# Patient Record
Sex: Female | Born: 1955
Health system: Southern US, Community
[De-identification: ages and names within clinical notes are randomized; demographics above are authoritative.]

## PROBLEM LIST (undated history)

## (undated) DIAGNOSIS — Z923 Personal history of irradiation: Secondary | ICD-10-CM

## (undated) DIAGNOSIS — M1990 Unspecified osteoarthritis, unspecified site: Secondary | ICD-10-CM

## (undated) DIAGNOSIS — Z9221 Personal history of antineoplastic chemotherapy: Secondary | ICD-10-CM

## (undated) DIAGNOSIS — Z808 Family history of malignant neoplasm of other organs or systems: Secondary | ICD-10-CM

## (undated) DIAGNOSIS — F419 Anxiety disorder, unspecified: Secondary | ICD-10-CM

## (undated) DIAGNOSIS — K589 Irritable bowel syndrome without diarrhea: Secondary | ICD-10-CM

## (undated) DIAGNOSIS — I10 Essential (primary) hypertension: Secondary | ICD-10-CM

## (undated) DIAGNOSIS — E611 Iron deficiency: Secondary | ICD-10-CM

## (undated) DIAGNOSIS — R011 Cardiac murmur, unspecified: Secondary | ICD-10-CM

## (undated) DIAGNOSIS — R112 Nausea with vomiting, unspecified: Secondary | ICD-10-CM

## (undated) DIAGNOSIS — R251 Tremor, unspecified: Secondary | ICD-10-CM

## (undated) DIAGNOSIS — Z9889 Other specified postprocedural states: Secondary | ICD-10-CM

## (undated) DIAGNOSIS — C801 Malignant (primary) neoplasm, unspecified: Secondary | ICD-10-CM

## (undated) DIAGNOSIS — K5790 Diverticulosis of intestine, part unspecified, without perforation or abscess without bleeding: Secondary | ICD-10-CM

## (undated) DIAGNOSIS — Z8051 Family history of malignant neoplasm of kidney: Secondary | ICD-10-CM

## (undated) HISTORY — DX: Diverticulosis of intestine, part unspecified, without perforation or abscess without bleeding: K57.90

## (undated) HISTORY — DX: Essential (primary) hypertension: I10

## (undated) HISTORY — DX: Iron deficiency: E61.1

## (undated) HISTORY — DX: Family history of malignant neoplasm of kidney: Z80.51

## (undated) HISTORY — DX: Irritable bowel syndrome, unspecified: K58.9

## (undated) HISTORY — PX: LAPAROSCOPY: SHX197

## (undated) HISTORY — DX: Family history of malignant neoplasm of other organs or systems: Z80.8

## (undated) HISTORY — PX: LAPAROSCOPIC SIGMOID COLECTOMY: SHX5928

## (undated) HISTORY — PX: BREAST LUMPECTOMY: SHX2

---

## 1998-06-16 ENCOUNTER — Other Ambulatory Visit: Admission: RE | Admit: 1998-06-16 | Discharge: 1998-06-16 | Payer: Self-pay | Admitting: Obstetrics and Gynecology

## 1999-11-28 ENCOUNTER — Other Ambulatory Visit: Admission: RE | Admit: 1999-11-28 | Discharge: 1999-11-28 | Payer: Self-pay | Admitting: Obstetrics and Gynecology

## 2001-01-16 ENCOUNTER — Encounter: Payer: Self-pay | Admitting: Gastroenterology

## 2001-01-16 ENCOUNTER — Ambulatory Visit (HOSPITAL_COMMUNITY): Admission: RE | Admit: 2001-01-16 | Discharge: 2001-01-16 | Payer: Self-pay | Admitting: Gastroenterology

## 2001-03-20 ENCOUNTER — Other Ambulatory Visit: Admission: RE | Admit: 2001-03-20 | Discharge: 2001-03-20 | Payer: Self-pay | Admitting: Obstetrics and Gynecology

## 2002-04-29 ENCOUNTER — Other Ambulatory Visit: Admission: RE | Admit: 2002-04-29 | Discharge: 2002-04-29 | Payer: Self-pay | Admitting: Obstetrics and Gynecology

## 2003-05-12 ENCOUNTER — Other Ambulatory Visit: Admission: RE | Admit: 2003-05-12 | Discharge: 2003-05-12 | Payer: Self-pay | Admitting: Obstetrics and Gynecology

## 2004-07-13 ENCOUNTER — Other Ambulatory Visit: Admission: RE | Admit: 2004-07-13 | Discharge: 2004-07-13 | Payer: Self-pay | Admitting: Obstetrics and Gynecology

## 2005-01-09 ENCOUNTER — Encounter (INDEPENDENT_AMBULATORY_CARE_PROVIDER_SITE_OTHER): Payer: Self-pay | Admitting: *Deleted

## 2005-01-09 ENCOUNTER — Inpatient Hospital Stay (HOSPITAL_COMMUNITY): Admission: RE | Admit: 2005-01-09 | Discharge: 2005-01-13 | Payer: Self-pay | Admitting: Surgery

## 2005-08-17 ENCOUNTER — Ambulatory Visit (HOSPITAL_COMMUNITY): Admission: RE | Admit: 2005-08-17 | Discharge: 2005-08-17 | Payer: Self-pay | Admitting: Chiropractic Medicine

## 2007-10-02 ENCOUNTER — Encounter: Admission: RE | Admit: 2007-10-02 | Discharge: 2007-10-02 | Payer: Self-pay | Admitting: Obstetrics and Gynecology

## 2008-06-23 ENCOUNTER — Encounter: Admission: RE | Admit: 2008-06-23 | Discharge: 2008-06-23 | Payer: Self-pay | Admitting: Obstetrics and Gynecology

## 2008-08-08 ENCOUNTER — Inpatient Hospital Stay (HOSPITAL_COMMUNITY): Admission: RE | Admit: 2008-08-08 | Discharge: 2008-08-10 | Payer: Self-pay | Admitting: Family Medicine

## 2008-08-13 ENCOUNTER — Encounter: Admission: RE | Admit: 2008-08-13 | Discharge: 2008-08-13 | Payer: Self-pay | Admitting: Surgery

## 2009-04-13 ENCOUNTER — Encounter: Admission: RE | Admit: 2009-04-13 | Discharge: 2009-04-13 | Payer: Self-pay | Admitting: Obstetrics and Gynecology

## 2010-05-08 LAB — BASIC METABOLIC PANEL WITH GFR
BUN: 7 mg/dL (ref 6–23)
CO2: 24 meq/L (ref 19–32)
Calcium: 8.4 mg/dL (ref 8.4–10.5)
Chloride: 109 meq/L (ref 96–112)
Creatinine, Ser: 0.62 mg/dL (ref 0.4–1.2)
GFR calc non Af Amer: >60 mL/min
Glucose, Bld: 94 mg/dL (ref 70–99)
Potassium: 3.7 meq/L (ref 3.5–5.1)
Sodium: 138 meq/L (ref 135–145)

## 2010-05-08 LAB — DIFFERENTIAL
Basophils Absolute: 0 K/uL (ref 0.0–0.1)
Basophils Relative: 0 % (ref 0–1)
Eosinophils Absolute: 0.1 K/uL (ref 0.0–0.7)
Eosinophils Relative: 2 % (ref 0–5)
Lymphocytes Relative: 27 % (ref 12–46)
Lymphs Abs: 1.7 K/uL (ref 0.7–4.0)
Monocytes Absolute: 0.5 K/uL (ref 0.1–1.0)
Monocytes Relative: 9 % (ref 3–12)
Neutro Abs: 3.9 K/uL (ref 1.7–7.7)
Neutrophils Relative %: 62 % (ref 43–77)

## 2010-05-08 LAB — CBC
HCT: 37.9 % (ref 36.0–46.0)
Hemoglobin: 13 g/dL (ref 12.0–15.0)
MCHC: 33.6 g/dL (ref 30.0–36.0)
MCHC: 34.3 g/dL (ref 30.0–36.0)
MCV: 87.2 fL (ref 78.0–100.0)
MCV: 88.1 fL (ref 78.0–100.0)
Platelets: 230 10*3/uL (ref 150–400)
RBC: 4.18 MIL/uL (ref 3.87–5.11)
RDW: 12.3 % (ref 11.5–15.5)
RDW: 12.4 % (ref 11.5–15.5)

## 2010-05-08 LAB — BASIC METABOLIC PANEL
BUN: 10 mg/dL (ref 6–23)
CO2: 25 mEq/L (ref 19–32)
Glucose, Bld: 99 mg/dL (ref 70–99)
Potassium: 3.4 mEq/L — ABNORMAL LOW (ref 3.5–5.1)
Sodium: 136 mEq/L (ref 135–145)

## 2010-05-08 LAB — PROTIME-INR: Prothrombin Time: 14.9 seconds (ref 11.6–15.2)

## 2010-05-08 LAB — CREATININE, SERUM: Creatinine, Ser: 0.76 mg/dL (ref 0.4–1.2)

## 2010-05-08 LAB — APTT

## 2010-06-14 NOTE — Consult Note (Signed)
Katie Marquez, Katie Marquez               ACCOUNT NO.:  [id number]   MEDICAL RECORD NO.:  [id number]          PATIENT TYPE:  EMS   LOCATION:  ED                           FACILITY:  [facility]   PHYSICIAN:  Clovis Pu. Cornett, M.D.DATE OF BIRTH:  08-26-1955   DATE OF CONSULTATION:  08/08/2008  DATE OF DISCHARGE:                                 CONSULTATION   PHYSICIAN REQUESTING CONSULTATION:  Dr. Valeria Batman   CHIEF COMPLAINT:  Right lower quadrant pain.   HISTORY OF PRESENT ILLNESS:  The patient is a very pleasant 55 year old  female with a 3-day history of lower abdominal pain.  The pain started  on Wednesday evening, was in both lower quadrants.  Her abdomen was  quite mild and actually improved over Thursday somewhat.  On Friday  evening, though, she began to have more right lower quadrant pain.  She  has had no nausea, vomiting, fever or chills.  The pain is probably a  4/10, described as a dull ache, but the patient was supposed to travel  out of town tomorrow and wanted to get checked out before she left town.  She went to the [facility] where CBC revealed a leukocytosis,  and she was felt to be tender on exam by the physician who saw her  there.  She was sent to the emergency room where Dr. Estell Harpin ordered a CT  scan of her abdomen and pelvis which showed a right lower quadrant  inflammatory process felt to be a right-sided diverticulitis by the  radiologist with a normal-appearing appendix by report.  She had a  phlegmon and a small 1 cm abscess in her right lower quadrant associated  with this.  She had no free air or free fluid otherwise.  Currently, she  feels quite well and has minimal pain at this point.  She has some  tenderness, she states, but it is not too bad.  She is in good  spirits, otherwise is not complaining of any pain elsewhere.  She denies  any history of blood in her stool or any issues with constipation or  diarrhea that are out of the ordinary for her.   She does have irritable  bowel she tells me and takes no medication for this.  She has a history  of sigmoid colectomy done 3 years ago by Dr. Darnell Level for sigmoid  diverticulitis.   PAST MEDICAL HISTORY:  1. Hypertension.  2. Irritable bowel syndrome.  3. History of left-sided diverticulitis.   PAST SURGICAL HISTORY:  Sigmoid colectomy in 2007.   SOCIAL HISTORY:  Denies tobacco or alcohol use.  She is married.  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   MEDICATIONS:  Include lisinopril, Prometrium and estradiol.   ALLERGIES:  NO KNOWN DRUG ALLERGIES.   FAMILY HISTORY:  No history of inflammatory bowel disease.   REVIEW OF SYSTEMS:  Positive for right lower quadrant pain; otherwise,  10-system review of systems is negative.   PHYSICAL EXAMINATION:  VITAL SIGNS:  Temperature 98, pulse 93, blood  pressure 163/86.  GENERAL APPEARANCE:  Pleasant female  in no apparent distress.  HEENT:  The patient wears glasses.  No evidence of scleral icterus.  Oropharynx shows good dentition.  NECK:  Supple, nontender.  Trachea midline.  No cervical adenopathy.  PULMONARY:  Lung sounds are clear and equal bilaterally.  CHEST:  Chest wall motion is normal bilaterally.  CARDIOVASCULAR:  Regular rate and rhythm without rub, murmur or gallop.  EXTREMITIES:  Warm and well perfused.  There is no evidence of any  muscle wasting.  Range of motion is normal.  ABDOMEN:  There is some point tenderness in the right lower quadrant and  some mild fullness.  There is no diffuse peritonitis.  No evidence of  hernia.  Bowel sounds are normal and present.   DIAGNOSTIC STUDIES:  I reviewed her abdominal/pelvic CT scan which shows  an inflammatory process involving the ascending colon.  There is a small  1 cm abscess near the cecum consistent with potential diverticular  abscess, less likely a perforated appendiceal base.  There is no free  fluid or free air.  Her white count is 12,500.  Hemoglobin is  15.   IMPRESSION:  Right lower quadrant inflammatory process, more than likely  right-sided diverticulitis with small abscess without diffuse  peritonitis, less likely perforated appendix with history of sigmoid  diverticular disease.   PLAN:  Recommend IV antibiotics and medical care.  She has relatively a  nonacute abdomen and actually looks quite well considering her CT  findings.  She will be placed on Unasyn, and certainly, she can be  advanced to p.o. antibiotics and discharged home as she feels fit.  She  will need to follow up with a repeat CT scan in 3-5 days, and if she is  doing very well, this can be done as an outpatient.  I would keep her  n.p.o. for the next day or so, see how she does, and if she improves,  she can be discharged early next week with followup with me as an  outpatient.  If her condition worsens, she will require percutaneous  drainage of this fluid collection, but it is quite small now, and I am  not sure they could even do it at this point.  I have discussed this  with the patient and her husband.  They voiced understanding of the  above plan, and she will be admitted to a medical service with surgery  following as needed.   Thank you for this consultation.      Thomas A. Cornett, M.D.  Electronically Signed     TAC/MEDQ  D:  08/08/2008  T:  08/08/2008  Job:  [id number]   cc:   Jethro Bastos, M.D.  Fax: [phone]   Griffith Citron, M.D.  Fax: [phone]

## 2010-06-14 NOTE — Discharge Summary (Signed)
[name], Marquez NO.:  [id number]   MEDICAL RECORD NO.:  [id number]          PATIENT TYPE:  INP   LOCATION:  1531                         FACILITY:  [facility]   PHYSICIAN:  Ramiro Harvest, MD    DATE OF BIRTH:  [birth year]   DATE OF ADMISSION:  08/08/2008  DATE OF DISCHARGE:  08/10/2008                               DISCHARGE SUMMARY   PRIMARY CARE PHYSICIAN:  Jethro Bastos, M.D., [facility].   DISCHARGE DIAGNOSES:  1. Right-sided diverticulitis with a small 1.2 cm abscess.  2. Hypokalemia, resolved.  3. Borderline hypotension, resolved.  4. Hypertension.  5. Irritable bowel syndrome.  6. History of left diverticulitis status post sigmoid colectomy in      2007.   DISCHARGE MEDICATIONS:  1. Augmentin 875 mg p.o. b.i.d. x13 days.  2. Lisinopril 20 mg p.o. q. daily.  3. Prometrium 100 mg q.h.s.  4. Estradiol patch 0.0375 mg per day TD q.4 days.   DISPOSITION/FOLLOW UP:  The patient will be discharged home.  The  patient will need a follow up CT of the abdomen and pelvis, which will  be arranged per Dr. Luisa Hart on January 13, 2009.  The patient will then  follow up with Dr. Luisa Hart on Friday,  January 14, 2009.  The patient  is to follow up with Dr. Kinnie Scales as needed and the patient is to follow  up with Dr. Dorothe Pea as previously scheduled.  The patient is to follow  up with Dr. Kinnie Scales in 2 weeks.  The patient may need a repeat  colonoscopy done to follow up on CT findings.   CONSULTATIONS:  A general surgery consult was done.  The patient was  seen in consultation by Dr. Luisa Hart on August 08, 2008.   PROCEDURES PERFORMED:  A CT of the abdomen and pelvis was performed on  August 08, 2008 which showed concentric low-density wall thickening  involving a 6 cm long segment of the mid to distal stomach.  This has an  appearance most compatible with gastritis.  This is lower in density  than expected for neoplasm.  Followup endoscopy is recommended.  Cholelithiasis.  Inferior right colonic diverticulitis with a 1.2 cm  abscess.  A perforated colonic neoplasm was less likely.  Follow up  colonoscopy is recommended to exclude a neoplastic process, normal  appendix, extensive colonic diverticulosis, and a fibroid uterus.   ADMISSION HISTORY AND PHYSICAL:  Ms. Katie Marquez is a 55 year old  white female, history of diverticulitis status post sigmoid resection of  the colon December, 2006 per Dr. Gerrit Friends.  The patient has done well over  the last 3-1/2 years post surgery.  On Wednesday night,  August 05, 2008,  the patient started experiencing mild discomfort in the right lower  quadrant.  This was not bothering her too much, but the night prior to  admission she experienced discomfort and was surprised because whenever  she had flare ups of her diverticulitis they were always left-sided.  The patient has stated that she was not in pain but when she pressed on  her right lower quadrant, that  was when the pain occurred.  The patient  was concerned about this because she was leaving town on Monday and  wanted to get checked out prior to leaving town. The patient presented  to the walk-in clinic at her primary care's office.  Preliminary CBC  done there revealed a leukocytosis.  CT scan of her abdomen and pelvis  was ordered.  CT scan showed evidence of inferior right colonic  diverticulitis with a 1.2 cm abscess.  There was no evidence of  appendicitis.  An extensive colonic diverticulosis was noted as well.  Therefore, the patient was sent to the emergency room for further  management.  Surgical consult was obtained in the ED and the surgeon  wanted the patient to be admitted to the medical service and would  consult on the patient.  The patient denied any fever or chills and had  not had any loose bowel movement.  The patient had no blood in her  stools.   PHYSICAL EXAM:  Per admitting physician, temperature 98.8, blood  pressure 152/104,  pulse of 93, respirations 18, satting 99% on room air.  GENERAL:  The patient no apparent distress, alert, awake and oriented  x3, afebrile.  HEENT:  Normocephalic, atraumatic.  Pupils equal, round and reactive to  light and accommodation.  Extraocular movements intact.  Mucous  membranes were moist.  NECK:  Supple.  No JVD, no lymphadenopathy.  CARDIOVASCULAR:  Regular rate and rhythm.  No murmurs, rubs or gallops.  RESPIRATORY:  Lungs were clear to auscultation bilaterally.  ABDOMEN:  Soft.  Tenderness in the right lower quadrant.  No rebound, no  guarding.  Normoactive bowel sounds.  EXTREMITIES:  No clubbing, cyanosis or edema.  NEUROLOGICALLY:  Grossly nonfocal.   ADMISSION LABS:  CBC:  White count 12.5, hemoglobin 15, hematocrit 45,  platelets 313, neutrophils of 80%, lymphocytes 16%, monocytes 4%.  Urinalysis was within normal limits.  CT scan as stated above.   HOSPITAL COURSE:  1. Right-sided diverticulitis with a 1.2 cm abscess.  The patient was      admitted with a diverticulitis with a 1.2 cm abscess.  The patient      was initially made n.p.o., placed on empiric IV antibiotics of      Unasyn and supportive care was initiated.  General surgery was      consulted.  The patient was seen by Dr. Luisa Hart on August 08, 2008      and it was felt that conservative measures needed to be followed at      that time.  The patient was monitored.  The patient improved      clinically and was started on a clear liquid diet, which was      subsequently advanced and the patient tolerated diet well.  The      patient improved clinically.  Antibiotics were changed from IV      Unasyn to oral Augmentin, which the patient tolerated well.  The      patient remained afebrile with normalization of her white count to      4.8.  The patient was followed throughout the hospitalization per      general surgery with Dr. Luisa Hart and the patient remained in stable      and improved condition.  It was  felt that patient could be      discharged with a follow up CT of the abdomen and pelvis to be      obtained on August 13, 2008 with a follow up with Dr. Luisa Hart on August 14, 2008.  Patient will be discharged in stable and improved      condition.  2. Hypokalemia.  During the hospitalization, patient was noted to be      hypokalemic.  The patient's potassium was repleted and by day of      discharge, patient's hypokalemia had resolved.  3. Borderline hypotension.  During the hospitalization, patient had      borderline hypotension.  Her blood pressure medications were held.      The patient's blood pressure dropped as low as 96/61.  Patient was      maintained on IV fluids with clinical and symptomatic improvement.      The patient's blood pressure medication was held and on day of      discharge patient's blood pressure was 129/85.  The patient will      resume her antihypertensives on discharge.   The rest of the patient's chronic medical issues remained stable  throughout the hospitalization.  The patient will be discharged in  stable and improved condition.  On day of discharge, vital signs:  Temperature 97.6, pulse of 71, respirations 18, blood pressure 129/85,  satting 99% on room air.   DISCHARGE LABS:  Sodium 138, potassium 3.7, chloride 109, bicarb 24, BUN  7.0, creatinine 0.62, glucose of 94, calcium of 8.4.  CBC with a white  count of 4.8, hemoglobin 12.4, platelets of 221, hematocrit of 36.8.   It was a pleasure taking care of Katie Marquez.      Ramiro Harvest, MD  Electronically Signed     DT/MEDQ  D:  08/10/2008  T:  08/10/2008  Job:  [id number]   cc:   Griffith Citron, M.D.  Fax: [phone]   Clovis Pu. Cornett, M.D.  [address]  [place] [address]   Jethro Bastos, M.D.  Fax: [phone]

## 2010-06-14 NOTE — H&P (Signed)
Katie Marquez               ACCOUNT NO.:  [id number]   MEDICAL RECORD NO.:  [id number]          PATIENT TYPE:  EMS   LOCATION:  ED                           FACILITY:  [facility]   PHYSICIAN:  Lucile Crater, MD         DATE OF BIRTH:  02-13-1955   DATE OF ADMISSION:  08/08/2008  DATE OF DISCHARGE:                              HISTORY & PHYSICAL   PRIMARY CARE PHYSICIAN:  Dr. __________   CHIEF COMPLAINT:  Abdominal pain x3 days.   HISTORY OF PRESENT ILLNESS:  Katie Marquez is a 55 year old white female  with a history of diverticulitis requiring resection of a segment of Katie Marquez  colon in December 2006 by Dr. Gerrit Friends.  The patient has done well over  the last 3-1/2 years after the surgery.  On Wednesday night, August 05, 2008, she started experiencing mild discomfort in the right lower  quadrant.  This is not bothering Katie Marquez too much, but last night she  experienced discomfort, and she was surprised because whenever she had  the flare ups of Katie Marquez diverticulitis, they were always left-sided.  The  patient states that she is not in pain, but when she presses on Katie Marquez  right lower quadrant, that is when the pain occurs.  She was concerned  about this because they are leaving town on Monday, and she wanted to  get it checked out before they leave.  So, she presented to the walk-in  clinic at the primary care physician's office; the preliminary CBC done  there revealed leukocytosis, and a CT scan of Katie Marquez abdomen and pelvis was  ordered.  The CT scan had evidence of inferior right colon  diverticulitis with a 1.2 cm abscess.  There was no evidence of  appendicitis, and extensive colonic diverticulosis was noted as well.  Therefore, the patient was sent to the emergency room for further  management.  A surgical consult was obtained by the ER physician, and  the surgeon wanted [place] to admit the patient, and he would consult.  The  patient denies having any fever or chills.  She does not have any change  in  Katie Marquez bowel movements.  She denies seeing any blood in Katie Marquez stool.   REVIEW OF SYSTEMS:  Complete review of systems was done which included  general, HEENT, cardiovascular, respiratory, GI, GU, endocrine,  musculoskeletal, skin, neurologic and psychiatric, and all are within  normal limits.   PAST MEDICAL HISTORY:  1. Hypertension.  2. Diverticulitis, status post resection of segment of the colon.  3. Irritable bowel syndrome.   ALLERGIES:  None.   CURRENT MEDICATIONS AT HOME:  1. Lisinopril 20 mg once a day.  2. Estradiol patch.  3. Prometrium 1 pill once a day.   SOCIAL HISTORY:  There is no history of tobacco abuse.  She drinks  socially.  There is no history of illicit drug use.  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   FAMILY HISTORY:  Diverticulitis in Katie Marquez father.  There is no history of  colon cancer in the family.   PHYSICAL EXAMINATION:  VITAL SIGNS:  T. max 98.8 degrees F., blood  pressure 152/104, pulse rate 93, respirations 18, O2 saturation 99% on  room air.  GENERAL APPEARANCE:  Not in acute distress.  Alert, awake and oriented  x3.  Afebrile.  HEENT:  Normocephalic, atraumatic.  Pupils are equal and reactive to  light and accommodation.  Extraocular muscles are intact.  Mucous  membranes are moist.  NECK:  Supple.  There is no JVD or lymphadenopathy.  CARDIOVASCULAR:  Regular rhythm.  Rate is normal.  No murmurs, rubs, or  gallops.  LUNGS:  Clear.  ABDOMEN:  Soft, tenderness in the right lower quadrant.  There is no  rebound or guarding.  Bowel sounds are normoactive.  EXTREMITIES:  No clubbing, cyanosis or edema.  NEUROLOGICAL:  Grossly nonfocal.   LABORATORIES:  Obtained at the primary care physician's office:  1. CBC with differential:  WBC 12,500, hemoglobin 15, hematocrit 45,      platelets 313,000.  Neutrophils 80%.  Lymphocytes 16%.  Monocytes      4%.  2. Urinalysis:  Within normal limits.  3. CT scan of the abdomen and pelvis with contrast dated  August 08, 2008:  Inferior right colon diverticulitis with a 1.2 cm abscess.      A perforated colonic neoplasm is less likely.  Follow-up      colonoscopy is recommended to exclude a neoplastic process.  Normal      appendix.  Extensive colonic diverticulosis.  Fibroid uterus.   ASSESSMENT/PLAN:  1. Acute complicated diverticulitis:  The patient has evidence of an      abscess formation on CAT scan.  We will admit the patient for      empiric antibiotic therapy.  We will start the patient on      ampicillin/sulbactam 3 grams IV q.6 h.  Patient might require      drainage of the abscess, and based on the culture results, we will      tailor the antibiotic regimen.  A surgery consult was already      called.  We will follow up on their recommendations.  2. Hypertension:  Not at goal.  Will titrate the medications.  3. Deep venous thrombosis prophylaxis with sequential compression      devices.  4. Fluids, electrolytes and nutrition:  We will start the patient on      normal saline 100 mL an hour.  Will replace electrolytes as needed.      She will be made n.p.o.   DISPOSITION:  We will admit the patient to the floor.      Lucile Crater, MD  Electronically Signed     TA/MEDQ  D:  08/08/2008  T:  08/08/2008  Job:  [id number]

## 2010-06-17 NOTE — Discharge Summary (Signed)
[name], Marquez               ACCOUNT NO.:  [id number]   MEDICAL RECORD NO.:  [id number]          PATIENT TYPE:  INP   LOCATION:  1402                         FACILITY:  [facility]   PHYSICIAN:  Velora Heckler, MD      DATE OF BIRTH:  Jul 05, 1955   DATE OF ADMISSION:  01/09/2005  DATE OF DISCHARGE:  01/13/2005                                 DISCHARGE SUMMARY   REASON FOR ADMISSION:  Sigmoid diverticular disease.   HISTORY OF PRESENT ILLNESS:  Katie Marquez is a pleasant 55 year old white  female from [place], [place] referred by Dr. Sharrell Ku for  sigmoid colectomy for management of sigmoid diverticular disease. The  patient has had multiple episodes of acute diverticulitis over the past 10  years. Her colonoscopy exams have become technically difficult due to the  significance of the disease in the sigmoid colon. The patient now comes to  surgery for sigmoid colectomy.   HOSPITAL COURSE:  The patient was admitted on January 09, 2005. She was  taken directly to the operating room where she underwent sigmoid colectomy  without complications. Her postoperative course was straight forward. She  received 24 hours of intravenous antibiotics. She had resolution of her  ileus and started on a clear liquid diet. She was advanced to a regular diet  on the third postoperative day. The patient had return of GI function with  bowel sounds and flatus. She was prepared for discharge home on the fourth  postoperative day.   PLAN:  The patient is discharged home today January 13, 2005 in good  condition, tolerating a regular diet, and ambulating independently. Final  pathology shows diverticular disease with diverticulosis and diverticulitis.  Eight lymph nodes are benign. Discharge medications include Percocet as  needed for pain. The patient will be seen back in my office at [facility] in 5 days for wound check and staple removal.   FINAL DIAGNOSES:  Sigmoid  diverticular disease.   CONDITION ON DISCHARGE:  Good.      Velora Heckler, MD  Electronically Signed     TMG/MEDQ  D:  01/13/2005  T:  01/16/2005  Job:  [id number]   cc:   Griffith Citron, M.D.  Fax: [phone]   Teena Irani. Arlyce Dice, M.D.  Fax: [phone]   Duke Salvia. Marcelle Overlie, M.D.  Fax: [phone]

## 2010-06-17 NOTE — Op Note (Signed)
Marquez, Katie               ACCOUNT NO.:  [id number]   MEDICAL RECORD NO.:  [id number]          PATIENT TYPE:  INP   LOCATION:  0001                         FACILITY:  [facility]   PHYSICIAN:  Velora Heckler, MD      DATE OF BIRTH:  04/05/1955   DATE OF PROCEDURE:  01/09/2005  DATE OF DISCHARGE:                                 OPERATIVE REPORT   PREOPERATIVE DIAGNOSIS:  Sigmoid diverticular disease.   POSTOPERATIVE DIAGNOSIS:  Sigmoid diverticular disease.   PROCEDURE:  Sigmoid colectomy.   SURGEON:  Velora Heckler, MD.   ASSISTANT:  Avel Peace, MD.   ANESTHESIA:  General per Ronelle Nigh, MD.   ESTIMATED BLOOD LOSS:  50 mL.   PREPARATION:  Betadine.   COMPLICATIONS:  None.   INDICATIONS:  The patient is a 55 year old white female from [place],  [place] who presents at the request Dr. Sharrell Ku for sigmoid  colectomy. The patient has had symptoms of diverticular disease for greater  than 10 years. She has had multiple episodes of acute diverticulitis  documented on CT scan and treated with antibiotics. The patient has  undergone colonoscopy in the past. Her last procedure was terminated in 2002  due to technical difficulty and she required a barium enema. The patient now  comes to surgery for sigmoid colectomy for treatment of longstanding sigmoid  diverticular disease.   DESCRIPTION OF PROCEDURE:  The procedure was done on OR#12 at the [facility]. The patient was brought to the operating room,  placed in a supine position on the operating room table. Following the  administration of general anesthesia, the patient was placed in low  lithotomy position and then prepped and draped in the usual strict aseptic  fashion. After ascertaining that an adequate level of anesthesia had been  obtained, a low midline surgical incision is made with a #10 blade from just  below the umbilicus to just above the pubis in the midline. Dissection  was  carried down through the subcutaneous tissues to the fascia. The fascia was  incised in the midline and the peritoneal cavity was entered cautiously. The  abdomen was briefly explored. No significant palpable masses were  identified. The patient is quite thin. Examination of the sigmoid colon  shows dense diverticular disease in the mid sigmoid. This continues to the  distal sigmoid where the bowel appears grossly normal just above the level  of the peritoneal reflection. The descending colon contained scattered  diverticula. The transverse colon contained scattered diverticula. The right  colon and cecum also contained scattered diverticula. The sigmoid colon was  mobilized from its lateral peritoneal attachments. A point in the distal  descending colon is selected and the mesentery was divided between Dunstan  clamps and ligated with 2-0 silk ties. Dissection was carried distally. The  mesentery was divided sequentially between [facility] clamps and ligated with 2-0  silk ties. Stay sutures were placed proximally and distally. Kocher clamps  are used to occlude the proximal and distal ends of the sigmoid colon and  the bowel is transected with a #  10 blade proximally and distally and the  entire sigmoid colon is removed. The descending colon is mobilized along its  lateral peritoneal attachments. The distal descending colon and distal  sigmoid colon are easily laid in apposition without tension. An end to end  anastomosis is then created with interrupted 3-0 silk sutures. Good  approximation was noted without tension. Good hemostasis was noted. The  abdomen was copiously irrigated with warm saline which was evacuated.  Vitagel is then used circumferentially around the anastomosis with good  sealant effect. Good hemostasis was noted. The sponges are removed and  sponge counts are correct. Omentum is used to cover the small bowel. The  midline wound is closed with a running #1 PDS suture. The  subcutaneous  tissue is irrigated and hemostasis obtained with the electrocautery. The  skin is closed with stainless steel staples. Sterile dressings are applied.  The patient was awakened from anesthesia and brought to the recovery room in  stable condition. The patient tolerated the procedure well.      Velora Heckler, MD  Electronically Signed     TMG/MEDQ  D:  01/09/2005  T:  01/10/2005  Job:  [id number]   cc:   Griffith Citron, M.D.  Fax: [phone]   Teena Irani. Arlyce Dice, M.D.  Fax: [phone]   [facility] Salvia. Marcelle Overlie, M.D.  Fax: [phone]

## 2010-09-27 ENCOUNTER — Other Ambulatory Visit: Payer: Self-pay | Admitting: Obstetrics and Gynecology

## 2010-09-27 DIAGNOSIS — Z1231 Encounter for screening mammogram for malignant neoplasm of breast: Secondary | ICD-10-CM

## 2010-09-28 ENCOUNTER — Ambulatory Visit: Payer: Self-pay

## 2010-10-06 ENCOUNTER — Ambulatory Visit
Admission: RE | Admit: 2010-10-06 | Discharge: 2010-10-06 | Disposition: A | Discharge status: Home / Self Care | Payer: 59 | Source: Ambulatory Visit | Attending: Obstetrics and Gynecology | Admitting: Obstetrics and Gynecology

## 2010-10-06 DIAGNOSIS — Z1231 Encounter for screening mammogram for malignant neoplasm of breast: Secondary | ICD-10-CM

## 2010-10-07 ENCOUNTER — Ambulatory Visit: Payer: Self-pay

## 2011-06-21 ENCOUNTER — Other Ambulatory Visit: Payer: Self-pay | Admitting: Obstetrics and Gynecology

## 2011-06-21 DIAGNOSIS — Z1231 Encounter for screening mammogram for malignant neoplasm of breast: Secondary | ICD-10-CM

## 2011-09-18 ENCOUNTER — Ambulatory Visit: Payer: 59

## 2011-09-18 ENCOUNTER — Ambulatory Visit
Admission: RE | Admit: 2011-09-18 | Discharge: 2011-09-18 | Disposition: A | Discharge status: Home / Self Care | Payer: 59 | Source: Ambulatory Visit | Attending: Obstetrics and Gynecology | Admitting: Obstetrics and Gynecology

## 2011-09-18 DIAGNOSIS — Z1231 Encounter for screening mammogram for malignant neoplasm of breast: Secondary | ICD-10-CM

## 2011-09-19 ENCOUNTER — Ambulatory Visit: Payer: 59

## 2011-10-09 ENCOUNTER — Ambulatory Visit: Payer: 59

## 2012-10-17 ENCOUNTER — Other Ambulatory Visit: Payer: Self-pay

## 2012-10-17 DIAGNOSIS — Z1231 Encounter for screening mammogram for malignant neoplasm of breast: Secondary | ICD-10-CM

## 2012-10-23 ENCOUNTER — Other Ambulatory Visit: Payer: Self-pay | Admitting: Obstetrics and Gynecology

## 2012-10-31 ENCOUNTER — Other Ambulatory Visit: Payer: Self-pay | Admitting: Obstetrics & Gynecology

## 2012-11-04 ENCOUNTER — Ambulatory Visit
Admission: RE | Admit: 2012-11-04 | Discharge: 2012-11-04 | Disposition: A | Discharge status: Home / Self Care | Payer: 59 | Source: Ambulatory Visit

## 2012-11-04 DIAGNOSIS — Z1231 Encounter for screening mammogram for malignant neoplasm of breast: Secondary | ICD-10-CM

## 2012-11-06 ENCOUNTER — Other Ambulatory Visit: Payer: Self-pay | Admitting: Obstetrics and Gynecology

## 2012-11-06 DIAGNOSIS — R928 Other abnormal and inconclusive findings on diagnostic imaging of breast: Secondary | ICD-10-CM

## 2012-11-08 ENCOUNTER — Other Ambulatory Visit: Payer: Self-pay | Admitting: Obstetrics and Gynecology

## 2012-11-08 ENCOUNTER — Ambulatory Visit
Admission: RE | Admit: 2012-11-08 | Discharge: 2012-11-08 | Disposition: A | Discharge status: Home / Self Care | Payer: 59 | Source: Ambulatory Visit | Attending: Obstetrics and Gynecology | Admitting: Obstetrics and Gynecology

## 2012-11-08 DIAGNOSIS — Z09 Encounter for follow-up examination after completed treatment for conditions other than malignant neoplasm: Secondary | ICD-10-CM

## 2012-11-08 DIAGNOSIS — R928 Other abnormal and inconclusive findings on diagnostic imaging of breast: Secondary | ICD-10-CM

## 2013-05-09 ENCOUNTER — Other Ambulatory Visit: Payer: 59

## 2013-05-13 ENCOUNTER — Other Ambulatory Visit: Payer: 59

## 2013-05-16 ENCOUNTER — Ambulatory Visit
Admission: RE | Admit: 2013-05-16 | Discharge: 2013-05-16 | Disposition: A | Discharge status: Home / Self Care | Payer: 59 | Source: Ambulatory Visit | Attending: Obstetrics and Gynecology | Admitting: Obstetrics and Gynecology

## 2013-05-16 DIAGNOSIS — Z09 Encounter for follow-up examination after completed treatment for conditions other than malignant neoplasm: Secondary | ICD-10-CM

## 2013-05-16 DIAGNOSIS — R928 Other abnormal and inconclusive findings on diagnostic imaging of breast: Secondary | ICD-10-CM

## 2013-10-29 ENCOUNTER — Other Ambulatory Visit: Payer: Self-pay | Admitting: Obstetrics and Gynecology

## 2013-10-29 DIAGNOSIS — N6310 Unspecified lump in the right breast, unspecified quadrant: Secondary | ICD-10-CM

## 2013-11-03 ENCOUNTER — Other Ambulatory Visit: Payer: Self-pay | Admitting: Obstetrics and Gynecology

## 2013-11-04 LAB — CYTOLOGY - PAP

## 2013-11-10 ENCOUNTER — Encounter (INDEPENDENT_AMBULATORY_CARE_PROVIDER_SITE_OTHER): Payer: Self-pay

## 2013-11-10 ENCOUNTER — Ambulatory Visit
Admission: RE | Admit: 2013-11-10 | Discharge: 2013-11-10 | Disposition: A | Discharge status: Home / Self Care | Payer: 59 | Source: Ambulatory Visit | Attending: Obstetrics and Gynecology | Admitting: Obstetrics and Gynecology

## 2013-11-10 DIAGNOSIS — N6310 Unspecified lump in the right breast, unspecified quadrant: Secondary | ICD-10-CM

## 2013-11-21 ENCOUNTER — Encounter: Payer: Self-pay | Admitting: *Deleted

## 2014-10-07 ENCOUNTER — Other Ambulatory Visit: Payer: Self-pay

## 2014-10-07 DIAGNOSIS — N6310 Unspecified lump in the right breast, unspecified quadrant: Secondary | ICD-10-CM

## 2014-11-16 ENCOUNTER — Ambulatory Visit
Admission: RE | Admit: 2014-11-16 | Discharge: 2014-11-16 | Disposition: A | Discharge status: Home / Self Care | Payer: 59 | Source: Ambulatory Visit

## 2014-11-16 DIAGNOSIS — N6310 Unspecified lump in the right breast, unspecified quadrant: Secondary | ICD-10-CM

## 2015-10-29 ENCOUNTER — Other Ambulatory Visit: Payer: Self-pay | Admitting: Obstetrics and Gynecology

## 2015-10-29 DIAGNOSIS — Z1231 Encounter for screening mammogram for malignant neoplasm of breast: Secondary | ICD-10-CM

## 2015-12-01 ENCOUNTER — Ambulatory Visit: Payer: 59

## 2015-12-06 ENCOUNTER — Ambulatory Visit
Admission: RE | Admit: 2015-12-06 | Discharge: 2015-12-06 | Disposition: A | Discharge status: Home / Self Care | Payer: 59 | Source: Ambulatory Visit | Attending: Obstetrics and Gynecology | Admitting: Obstetrics and Gynecology

## 2015-12-06 DIAGNOSIS — Z1231 Encounter for screening mammogram for malignant neoplasm of breast: Secondary | ICD-10-CM

## 2016-02-08 DIAGNOSIS — D3132 Benign neoplasm of left choroid: Secondary | ICD-10-CM | POA: Diagnosis not present

## 2016-03-07 DIAGNOSIS — H6123 Impacted cerumen, bilateral: Secondary | ICD-10-CM | POA: Diagnosis not present

## 2016-03-20 DIAGNOSIS — Z13820 Encounter for screening for osteoporosis: Secondary | ICD-10-CM | POA: Diagnosis not present

## 2016-03-24 DIAGNOSIS — K5730 Diverticulosis of large intestine without perforation or abscess without bleeding: Secondary | ICD-10-CM | POA: Diagnosis not present

## 2016-06-23 DIAGNOSIS — H2513 Age-related nuclear cataract, bilateral: Secondary | ICD-10-CM | POA: Diagnosis not present

## 2016-06-28 DIAGNOSIS — R1319 Other dysphagia: Secondary | ICD-10-CM | POA: Diagnosis not present

## 2016-10-23 DIAGNOSIS — H6123 Impacted cerumen, bilateral: Secondary | ICD-10-CM | POA: Diagnosis not present

## 2016-11-02 DIAGNOSIS — Z0000 Encounter for general adult medical examination without abnormal findings: Secondary | ICD-10-CM | POA: Diagnosis not present

## 2016-11-02 DIAGNOSIS — Z23 Encounter for immunization: Secondary | ICD-10-CM | POA: Diagnosis not present

## 2016-11-02 DIAGNOSIS — I10 Essential (primary) hypertension: Secondary | ICD-10-CM | POA: Diagnosis not present

## 2016-11-16 ENCOUNTER — Other Ambulatory Visit: Payer: Self-pay | Admitting: Obstetrics and Gynecology

## 2016-11-16 DIAGNOSIS — Z1231 Encounter for screening mammogram for malignant neoplasm of breast: Secondary | ICD-10-CM

## 2016-11-28 DIAGNOSIS — Z01419 Encounter for gynecological examination (general) (routine) without abnormal findings: Secondary | ICD-10-CM | POA: Diagnosis not present

## 2016-11-28 DIAGNOSIS — Z6821 Body mass index (BMI) 21.0-21.9, adult: Secondary | ICD-10-CM | POA: Diagnosis not present

## 2016-12-12 ENCOUNTER — Ambulatory Visit
Admission: RE | Admit: 2016-12-12 | Discharge: 2016-12-12 | Disposition: A | Discharge status: Home / Self Care | Payer: 59 | Source: Ambulatory Visit | Attending: Obstetrics and Gynecology | Admitting: Obstetrics and Gynecology

## 2016-12-12 DIAGNOSIS — Z1231 Encounter for screening mammogram for malignant neoplasm of breast: Secondary | ICD-10-CM | POA: Diagnosis not present

## 2016-12-13 ENCOUNTER — Other Ambulatory Visit: Payer: Self-pay | Admitting: Obstetrics and Gynecology

## 2016-12-13 DIAGNOSIS — R928 Other abnormal and inconclusive findings on diagnostic imaging of breast: Secondary | ICD-10-CM

## 2016-12-15 ENCOUNTER — Ambulatory Visit
Admission: RE | Admit: 2016-12-15 | Discharge: 2016-12-15 | Disposition: A | Discharge status: Home / Self Care | Payer: 59 | Source: Ambulatory Visit | Attending: Obstetrics and Gynecology | Admitting: Obstetrics and Gynecology

## 2016-12-15 ENCOUNTER — Other Ambulatory Visit: Payer: Self-pay | Admitting: Obstetrics and Gynecology

## 2016-12-15 DIAGNOSIS — R928 Other abnormal and inconclusive findings on diagnostic imaging of breast: Secondary | ICD-10-CM

## 2016-12-15 DIAGNOSIS — C50511 Malignant neoplasm of lower-outer quadrant of right female breast: Secondary | ICD-10-CM | POA: Diagnosis not present

## 2016-12-15 DIAGNOSIS — N6313 Unspecified lump in the right breast, lower outer quadrant: Secondary | ICD-10-CM | POA: Diagnosis not present

## 2016-12-15 DIAGNOSIS — R922 Inconclusive mammogram: Secondary | ICD-10-CM | POA: Diagnosis not present

## 2016-12-15 DIAGNOSIS — N6310 Unspecified lump in the right breast, unspecified quadrant: Secondary | ICD-10-CM | POA: Diagnosis not present

## 2016-12-20 ENCOUNTER — Other Ambulatory Visit: Payer: 59

## 2016-12-24 ENCOUNTER — Encounter: Payer: Self-pay | Admitting: General Surgery

## 2016-12-26 DIAGNOSIS — C50311 Malignant neoplasm of lower-inner quadrant of right female breast: Secondary | ICD-10-CM | POA: Diagnosis not present

## 2016-12-26 DIAGNOSIS — I10 Essential (primary) hypertension: Secondary | ICD-10-CM | POA: Diagnosis not present

## 2016-12-27 ENCOUNTER — Encounter: Payer: Self-pay | Admitting: General Surgery

## 2016-12-27 ENCOUNTER — Other Ambulatory Visit: Payer: Self-pay | Admitting: General Surgery

## 2016-12-27 DIAGNOSIS — C50511 Malignant neoplasm of lower-outer quadrant of right female breast: Secondary | ICD-10-CM

## 2016-12-27 DIAGNOSIS — Z170 Estrogen receptor positive status [ER+]: Principal | ICD-10-CM

## 2016-12-27 NOTE — Progress Notes (Signed)
Location of Breast Cancer: Right Breast  Histology per Pathology Report: 12/15/16  Diagnosis Breast, right, needle core biopsy, 7:00 o'clock - INVASIVE DUCTAL CARCINOMA - DUCTAL CARCINOMA IN SITU  Receptor Status: ER(95%), PR (50%), Her2-neu (POS), Ki-(15%)  Did patient present with symptoms or was this found on screening mammography?: It was found on a screeening mammogram.   Past/Anticipated interventions by surgeon, if any: Dr. Ingram. Surgery planned for 01/08/17  Past/Anticipated interventions by medical oncology, if any:  Dr. Magrinat 01/02/17  Lymphedema issues, if any:  No  Pain issues, if any: No   SAFETY ISSUES:  Prior radiation? No  Pacemaker/ICD? No  Possible current pregnancy? No  Is the patient on methotrexate? No   Current Complaints / other details:    BP (!) 151/94   Pulse 73   Temp 98.9 F (37.2 C)   Ht 5' 2" (1.575 m)   Wt 114 lb (51.7 kg)   SpO2 100% Comment: room air  BMI 20.85 kg/m    Wt Readings from Last 3 Encounters:  01/02/17 114 lb (51.7 kg)      Malmfelt, Jennifer L, RN 12/27/2016,4:28 PM   

## 2016-12-28 ENCOUNTER — Other Ambulatory Visit: Payer: Self-pay | Admitting: General Surgery

## 2016-12-28 DIAGNOSIS — C50511 Malignant neoplasm of lower-outer quadrant of right female breast: Secondary | ICD-10-CM

## 2016-12-28 DIAGNOSIS — Z170 Estrogen receptor positive status [ER+]: Principal | ICD-10-CM

## 2017-01-01 ENCOUNTER — Encounter (HOSPITAL_BASED_OUTPATIENT_CLINIC_OR_DEPARTMENT_OTHER): Payer: Self-pay | Admitting: *Deleted

## 2017-01-02 ENCOUNTER — Encounter: Payer: Self-pay | Admitting: Radiation Oncology

## 2017-01-02 ENCOUNTER — Ambulatory Visit
Admission: RE | Admit: 2017-01-02 | Discharge: 2017-01-02 | Disposition: A | Discharge status: Home / Self Care | Payer: 59 | Source: Ambulatory Visit | Attending: Radiation Oncology | Admitting: Radiation Oncology

## 2017-01-02 ENCOUNTER — Ambulatory Visit (HOSPITAL_BASED_OUTPATIENT_CLINIC_OR_DEPARTMENT_OTHER): Payer: 59 | Admitting: Oncology

## 2017-01-02 VITALS — BP 158/99 | HR 91 | Temp 99.1°F | Resp 18 | Ht 62.0 in | Wt 114.0 lb

## 2017-01-02 DIAGNOSIS — Z9049 Acquired absence of other specified parts of digestive tract: Secondary | ICD-10-CM | POA: Diagnosis not present

## 2017-01-02 DIAGNOSIS — Z8379 Family history of other diseases of the digestive system: Secondary | ICD-10-CM | POA: Insufficient documentation

## 2017-01-02 DIAGNOSIS — Z79899 Other long term (current) drug therapy: Secondary | ICD-10-CM | POA: Insufficient documentation

## 2017-01-02 DIAGNOSIS — C50511 Malignant neoplasm of lower-outer quadrant of right female breast: Secondary | ICD-10-CM

## 2017-01-02 DIAGNOSIS — Z9889 Other specified postprocedural states: Secondary | ICD-10-CM | POA: Diagnosis not present

## 2017-01-02 DIAGNOSIS — Z170 Estrogen receptor positive status [ER+]: Secondary | ICD-10-CM | POA: Insufficient documentation

## 2017-01-02 DIAGNOSIS — K589 Irritable bowel syndrome without diarrhea: Secondary | ICD-10-CM | POA: Diagnosis not present

## 2017-01-02 DIAGNOSIS — Z820 Family history of epilepsy and other diseases of the nervous system: Secondary | ICD-10-CM | POA: Insufficient documentation

## 2017-01-02 DIAGNOSIS — Z8249 Family history of ischemic heart disease and other diseases of the circulatory system: Secondary | ICD-10-CM | POA: Diagnosis not present

## 2017-01-02 DIAGNOSIS — Z8261 Family history of arthritis: Secondary | ICD-10-CM | POA: Diagnosis not present

## 2017-01-02 DIAGNOSIS — Z808 Family history of malignant neoplasm of other organs or systems: Secondary | ICD-10-CM | POA: Diagnosis not present

## 2017-01-02 DIAGNOSIS — I10 Essential (primary) hypertension: Secondary | ICD-10-CM | POA: Insufficient documentation

## 2017-01-02 DIAGNOSIS — M8580 Other specified disorders of bone density and structure, unspecified site: Secondary | ICD-10-CM

## 2017-01-02 NOTE — Progress Notes (Signed)
[place]  Telephone:([phone] Fax:([phone]) [phone]     ID: ELLYSE ROTOLO DOB: [birth year]  MR#: [id number]  FUX#:[id number]  Patient Care Team: Jonathon Jordan, MD as PCP - General (Family Medicine) Fanny Skates, MD as Consulting Physician (General Surgery) Reilley Latorre, Virgie Dad, MD as Consulting Physician (Oncology) Richmond Campbell, MD as Consulting Physician (Gastroenterology) Eppie Gibson, MD as Attending Physician (Radiation Oncology) Lovey Newcomer, MD as Attending Physician (Radiology) Bensimhon, Shaune Pascal, MD as Consulting Physician (Cardiology) Molli Posey, MD as Consulting Physician (Obstetrics and Gynecology) Chauncey Cruel, MD OTHER MD:  CHIEF COMPLAINT: Triple positive breast cancer  CURRENT TREATMENT: Awaiting definitive surgery   HISTORY OF CURRENT ILLNESS: Bethena Roys had bilateral screening mammography with tomography at the breast center 12/12/2016, showing a possible mass in the right breast.  Right breast diagnostic mammography with tomography and right breast ultrasonography 12/15/2016 found the breast density to be category D.  In the lower right breast there was a small lobulated mass which was not palpable.  Ultrasound showed a 0.8 cm mass in the right breast 7:00 radiant 2 cm from the nipple.  The right axilla was benign.  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  The patient's subsequent history is as detailed below.  INTERVAL HISTORY: Bethena Roys was evaluated in the breast clinic 01/02/2017 accompanied by her husband [name]. Her case was also presented at the multidisciplinary breast cancer conference on 12/27/2016.  At that time a preliminary plan was proposed:  Breast conserving surgery, with port placement and adjuvant chemo immunotherapy.   REVIEW OF SYSTEMS: There were no specific symptoms leading to the original mammogram, which was routinely scheduled. The patient denies unusual headaches, visual changes, nausea, vomiting, stiff neck, dizziness, or gait imbalance. There has been no cough, phlegm production, or pleurisy, no chest pain or pressure, and no change in bowel or bladder habits. The patient denies fever, rash, bleeding, unexplained fatigue or unexplained weight loss. A detailed review of systems was otherwise entirely negative.   PAST MEDICAL HISTORY: Past Medical History:  Diagnosis Date  . Diverticulosis   . Hypertension   . IBS (irritable bowel syndrome)   . Iron deficiency   . PONV (postoperative nausea and vomiting)     PAST SURGICAL HISTORY: Past Surgical History:  Procedure Laterality Date  . LAPAROSCOPIC SIGMOID COLECTOMY      FAMILY HISTORY Family History  Problem Relation Age of Onset  . Rheum arthritis Mother   . Hyperlipidemia Mother   . Hypertension Mother   . Diverticulitis Father   . Parkinson's disease Father   . Breast cancer Neg Hx   Bethena Roys comes from an [place] background.  Her father died at 30 with Parkinson's disease and complications from tobacco abuse including a history of renal cancer; the patient's mother died at age 36, with a history of rheumatoid arthritis and thyroid cancer.  The patient had no brothers, 1 sister, in good health.  There is no history of breast or ovarian cancer in the family  GYNECOLOGIC HISTORY:  No LMP recorded. Patient is postmenopausal. Menarche age 2, first live birth age 53.  The patient is GX P1.  She had infertility treatments after her first live birth but they were not successful.  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  Their daughter [name] lives in [place].  She attended [employer] and then [employer] in business and psychology.  She is planning to get married in September 2019.  Her fiance is in finance.     ADVANCED DIRECTIVES:    HEALTH MAINTENANCE: Social History   Tobacco Use  . Smoking status: Never Smoker  . Smokeless tobacco: Never Used  Substance Use Topics  . Alcohol use: Yes    Frequency: Never    Comment: occas  . Drug use: No     Colonoscopy: [facility]  PAP: [place]  Bone density: Osteopenia, remote test   No Known Allergies  Current Outpatient Medications  Medication Sig Dispense Refill  . calcium carbonate (OS-CAL) 600 MG TABS tablet Take 600 mg by mouth 2 (two) times daily with a meal.    . cholecalciferol (VITAMIN D) 1000 units tablet Take 2,000 Units by mouth daily.    Marland Kitchen FLUoxetine (PROZAC) 10 MG capsule Take 20 mg by mouth daily.     Marland Kitchen lisinopril (PRINIVIL,ZESTRIL) 20 MG tablet Take 20 mg by mouth daily.    . Probiotic Product (PROBIOTIC-10 PO) Take by mouth.    . Red Yeast Rice 600 MG CAPS Take by mouth.     No current facility-administered medications for this visit.     OBJECTIVE: 61-year-old white woman who appears well  Vitals:   01/02/17 1600  BP: (!) 158/99  Pulse: 91  Resp: 18  Temp: 99.1 F (37.3 C)  SpO2: 100%     Body mass index is 20.85 kg/m.   Wt Readings from Last 3 Encounters:  01/02/17 114 lb (51.7 kg)  01/02/17 114 lb (51.7 kg)      ECOG FS:0 - Asymptomatic  Ocular: Sclerae unicteric, pupils round and equal Ear-nose-throat: Oropharynx clear and moist Lymphatic: No cervical or supraclavicular adenopathy Lungs no rales or rhonchi Heart regular rate and rhythm Abd soft, nontender, positive bowel sounds MSK no focal spinal tenderness, no joint edema Neuro: non-focal, well-oriented, appropriate affect Breasts: The right breast is status post recent biopsy.  There is a small  ecchymosis inferiorly.  There is no palpable mass.  The left breast is benign.  Both axillae are benign.   LAB RESULTS:  CMP     Component Value Date/Time   NA 138 08/10/2008 0435   K 3.7 08/10/2008 0435   CL 109 08/10/2008 0435   CO2 24 08/10/2008 0435   GLUCOSE 94 08/10/2008 0435   BUN 7 08/10/2008 0435   CREATININE 0.62 08/10/2008 0435   CALCIUM 8.4 08/10/2008 0435   GFRNONAA >60 08/10/2008 0435   GFRAA  08/10/2008 0435    >60        The eGFR has been calculated using the MDRD equation. This calculation has not been validated in all clinical situations. eGFR's persistently <60 mL/min signify possible Chronic Kidney Disease.    No results found for: TOTALPROTELP, ALBUMINELP, A1GS, A2GS, BETS, BETA2SER, GAMS, MSPIKE, SPEI  No results found for: KPAFRELGTCHN, LAMBDASER, KAPLAMBRATIO  Lab Results  Component Value Date   WBC 4.8 08/10/2008   NEUTROABS 3.9 08/09/2008   HGB 12.4 08/10/2008   HCT 36.8 08/10/2008   MCV 88.1 08/10/2008   PLT 221 08/10/2008      Chemistry  Component Value Date/Time   NA 138 08/10/2008 0435   K 3.7 08/10/2008 0435   CL 109 08/10/2008 0435   CO2 24 08/10/2008 0435   BUN 7 08/10/2008 0435   CREATININE 0.62 08/10/2008 0435      Component Value Date/Time   CALCIUM 8.4 08/10/2008 0435       No results found for: LABCA2  No components found for: ERXVQM086  No results for input(s): INR in the last 168 hours.  No results found for: LABCA2  No results found for: PYP950  No results found for: DTO671  No results found for: IWP809  No results found for: CA2729  No components found for: HGQUANT  No results found for: CEA1 / No results found for: CEA1   No results found for: AFPTUMOR  No results found for: CHROMOGRNA  No results found for: PSA1  No visits with results within 3 Day(s) from this visit.  Latest known visit with results is:  Orders Only on 11/03/2013  Component Date Value Ref Range Status  . CYTOLOGY  - PAP 11/03/2013 PAP RESULT   Final    (this displays the last labs from the last 3 days)  No results found for: TOTALPROTELP, ALBUMINELP, A1GS, A2GS, BETS, BETA2SER, GAMS, MSPIKE, SPEI (this displays SPEP labs)  No results found for: KPAFRELGTCHN, LAMBDASER, KAPLAMBRATIO (kappa/lambda light chains)  No results found for: HGBA, HGBA2QUANT, HGBFQUANT, HGBSQUAN (Hemoglobinopathy evaluation)   No results found for: LDH  No results found for: IRON, TIBC, IRONPCTSAT (Iron and TIBC)  No results found for: FERRITIN  Urinalysis No results found for: COLORURINE, APPEARANCEUR, LABSPEC, PHURINE, GLUCOSEU, HGBUR, BILIRUBINUR, KETONESUR, PROTEINUR, UROBILINOGEN, NITRITE, LEUKOCYTESUR   STUDIES: US Breast Ltd Uni Right Inc Axilla  Result Date: 12/15/2016 CLINICAL DATA:  Patient recalled from screening for right breast mass EXAM: 2D DIGITAL DIAGNOSTIC RIGHT MAMMOGRAM WITH CAD AND ADJUNCT TOMO ULTRASOUND RIGHT BREAST COMPARISON:  Previous exam(s). ACR Breast Density Category d: The breast tissue is extremely dense, which lowers the sensitivity of mammography. FINDINGS: Within the inferior aspect of the right breast anterior depth there is a new small lobular mass, further evaluated with spot compression CC and MLO tomosynthesis images. Mammographic images were processed with CAD. On physical exam, I palpate no discrete mass within the inferior aspect of the right breast. Targeted ultrasound is performed, showing a 5 x 8 x 8 mm lobular hypoechoic mass right breast 7 o'clock position 2 cm from the nipple. No right axillary lymphadenopathy. IMPRESSION: New suspicious right breast mass. RECOMMENDATION: Ultrasound-guided core needle biopsy right breast mass. I have discussed the findings and recommendations with the patient. Results were also provided in writing at the conclusion of the visit. If applicable, a reminder letter will be sent to the patient regarding the next appointment. BI-RADS CATEGORY  4:  Suspicious. Electronically Signed   By: Lovey Newcomer M.D.   On: 12/15/2016 10:23   Mm Diag Breast Tomo Uni Right  Result Date: 12/15/2016 CLINICAL DATA:  Patient recalled from screening for right breast mass EXAM: 2D DIGITAL DIAGNOSTIC RIGHT MAMMOGRAM WITH CAD AND ADJUNCT TOMO ULTRASOUND RIGHT BREAST COMPARISON:  Previous exam(s). ACR Breast Density Category d: The breast tissue is extremely dense, which lowers the sensitivity of mammography. FINDINGS: Within the inferior aspect of the right breast anterior depth there is a new small lobular mass, further evaluated with spot compression CC and MLO tomosynthesis images. Mammographic images were processed with CAD. On physical exam, I palpate no discrete mass within the inferior aspect of  the right breast. Targeted ultrasound is performed, showing a 5 x 8 x 8 mm lobular hypoechoic mass right breast 7 o'clock position 2 cm from the nipple. No right axillary lymphadenopathy. IMPRESSION: New suspicious right breast mass. RECOMMENDATION: Ultrasound-guided core needle biopsy right breast mass. I have discussed the findings and recommendations with the patient. Results were also provided in writing at the conclusion of the visit. If applicable, a reminder letter will be sent to the patient regarding the next appointment. BI-RADS CATEGORY  4: Suspicious. Electronically Signed   By: Lovey Newcomer M.D.   On: 12/15/2016 10:23   Mm Screening Breast Tomo Bilateral  Result Date: 12/12/2016 CLINICAL DATA:  Screening. EXAM: 2D DIGITAL SCREENING BILATERAL MAMMOGRAM WITH CAD AND ADJUNCT TOMO COMPARISON:  Previous exam(s). ACR Breast Density Category c: The breast tissue is heterogeneously dense, which may obscure small masses. FINDINGS: In the right breast, a possible mass warrants further evaluation. In the left breast, no findings suspicious for malignancy. Images were processed with CAD. IMPRESSION: Further evaluation is suggested for possible mass in the right breast.  RECOMMENDATION: Diagnostic mammogram and possibly ultrasound of the right breast. (Code:[id number]) The patient will be contacted regarding the findings, and additional imaging will be scheduled. BI-RADS CATEGORY  0: Incomplete. Need additional imaging evaluation and/or prior mammograms for comparison. Electronically Signed   By: Lovey Newcomer M.D.   On: 12/12/2016 11:51   Mm Clip Placement Right  Result Date: 12/15/2016 CLINICAL DATA:  Status post ultrasound-guided biopsy right breast mass. EXAM: DIAGNOSTIC RIGHT MAMMOGRAM POST ULTRASOUND BIOPSY COMPARISON:  Previous exam(s). FINDINGS: Mammographic images were obtained following ultrasound guided biopsy of right breast mass. Ribbon shaped marking clip in appropriate position. IMPRESSION: Appropriate position ribbon shaped marking clip status post ultrasound-guided biopsy right breast mass. Final Assessment: Post Procedure Mammograms for Marker Placement Electronically Signed   By: Lovey Newcomer M.D.   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 This was found to be concordant by Dr. Lovey Newcomer. Pathology results were discussed with the patient by telephone. The patient reported doing well after the biopsy. Post biopsy instructions and care were reviewed and questions were answered. The patient was encouraged to call [facility] for any additional concerns. At the request of the patient, surgical consultation has been arranged with Dr. Fanny Skates at [facility] on December 26, 2016. Pathology results reported by Susa Raring RN, BSN on 12/19/2016. Electronically Signed   By: Lovey Newcomer M.D.   On: 12/19/2016 08:49   Result Date: 12/19/2016 CLINICAL DATA:  Patient with indeterminate right  breast mass. EXAM: ULTRASOUND GUIDED RIGHT BREAST CORE NEEDLE BIOPSY COMPARISON:  Previous exam(s). FINDINGS: I met with the patient and we discussed the procedure of ultrasound-guided biopsy, including benefits and alternatives. We discussed the high likelihood of a successful procedure. We discussed the risks of the procedure, including infection, bleeding, tissue injury, clip migration, and inadequate sampling. Informed written consent was given. The usual time-out protocol was performed immediately prior to the procedure. Lesion quadrant: Lower outer quadrant Using sterile technique and 1% Lidocaine as local anesthetic, under direct ultrasound visualization, a 12 gauge spring-loaded device was used to perform biopsy of right breast mass 7 o'clock position using a lateral approach. At the conclusion  of the procedure a ribbon shaped tissue marker clip was deployed into the biopsy cavity. Follow up 2 view mammogram was performed and dictated separately. IMPRESSION: Ultrasound guided biopsy of right breast mass. No apparent complications. Electronically Signed: By: Lovey Newcomer M.D. On: 12/15/2016 11:44    ELIGIBLE FOR AVAILABLE RESEARCH PROTOCOL: no  ASSESSMENT: 61 y.o. [place] woman status post right breast lower outer quadrant biopsy 12/15/2016 for a clinical T1b N0 invasive ductal carcinoma, grade 3, triple positive, with an MIB-1 of 15%  (1) breast conserving surgery pending  (2) adjuvant chemotherapy will consist of paclitaxel weekly x12, together with trastuzumab every 21 days  (3) the trastuzumab to be continued to total 1 year  (4) adjuvant radiation planned  (5) antiestrogens to follow at the completion of local treatment  (6) genetics testing pending  PLAN: We spent the better part of today's hour-long appointment discussing the biology of her diagnosis and the specifics of her situation. We first reviewed the fact that cancer is not one disease but more than 100 different diseases  and that it is important to keep them separate-- otherwise when friends and relatives discuss their own cancer experiences with Reesa confusion can result. Similarly we explained that if breast cancer spreads to the bone or liver, the patient would not have bone cancer or liver cancer, but breast cancer in the bone and breast cancer in the liver: one cancer in three places-- not 3 different cancers which otherwise would have to be treated in 3 different ways.  We discussed the difference between local and systemic therapy. In terms of loco-regional treatment, lumpectomy plus radiation is equivalent to mastectomy as far as survival is concerned. For this reason, and because the cosmetic results are generally superior, we recommend breast conserving surgery.   Bethena Roys does qualify for genetics testing. In patients who carry a deleterious mutation [for example in a  BRCA gene], the risk of a new breast cancer developing in the future may be sufficiently great that the patient may choose bilateral mastectomies. However if she wishes to keep her breasts in that situation it is safe to do so. That would require intensified screening, which generally means not only yearly mammography but a yearly breast MRI as well. Of course, if there is a deleterious mutation bilateral oophorectomy would be necessary as there is no standard screening protocol for ovarian cancer. At this point she is clear she wishes to proceed with surgery as planned and would choose intensified screening if she did carry a founder mutation, reserving bilateral mastectomies for such a time as a second breast cancer might (or might not) develop in the future.  We then discussed the rationale for systemic therapy. There is some risk that this cancer may have already spread to other parts of her body. Patients frequently ask at this point about bone scans, CAT scans and PET scans to find out if they have occult breast cancer somewhere else. The problem  is that in early stage disease we are much more likely to find false positives then true cancers and this would expose the patient to unnecessary procedures as well as unnecessary radiation. Scans cannot answer the question the patient really would like to know, which is whether she has microscopic disease elsewhere in her body. For those reasons we do not recommend them.  Of course we would proceed to aggressive evaluation of any symptoms that might suggest metastatic disease, but that is not the case here.  Next we went over  the options for systemic therapy which are anti-estrogens, anti-HER-2 immunotherapy, and chemotherapy. [place] meets criteria for all three.  This means that if the risk of systemic recurrence after optimal local treatment is as high as 42% (and I believe it would be lower in her case), then chemotherapy would lower it by one third, and anti-HER-2 and antiestrogen treatments each by one half so her final risk of recurrence would be in the 7% or so range.  She understands I think her prognosis actually is even better than that with standard therapy but we used a deliberately high number to get the point across.  The plan then will be for her to receive paclitaxel weekly x12 together with trastuzumab every 3 weeks for 1 year.  She would like to start February 01, 2016.  Before that she will need to have a port placed, which Dr. Dalbert Batman plans to do next week, at the time of her lumpectomy, and she will need an echocardiogram.  She will come to chemotherapy school sometime this month and she will see me together with my physician's assistant 01/26/2017 in preparation for her treatment the first week in January  Marja has a good understanding of the overall plan. She agrees with it. She knows the goal of treatment in her case is cure. She will call with any problems that may develop before her next visit here.  Chauncey Cruel, MD   01/02/2017 6:02 PM Medical Oncology and Hematology [facility] [address] [place], [place] [address] Tel. [phone]    Fax. [phone]

## 2017-01-02 NOTE — Progress Notes (Signed)
Radiation Oncology         [phone] ________________________________  Initial outpatient Consultation  Name: Katie Marquez MRN: [id number]  Date: 01/02/2017  DOB: 25-Aug-1955  QT:[id number], Ivin Booty, MD  Fanny Skates, MD   REFERRING PHYSICIAN: Fanny Skates, MD  DIAGNOSIS:    ICD-10-CM   1. 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 female who presented for screening mammogram on 12/12/16 which showed possible mass in the right breast. She did not have any symptoms beforehand. Targeted US was then performed revealing a 5 x 8 x 8 mm lobular hypoechoic mass in 7 o'clock position of right breast 2 cm from the nipple. No right axillary lymphadenopathy was appreciated. Biopsy on 12/15/16 showed invasive ductal carcinoma and DCIS, 0.4 cm in greatest extent.  Dr. Dalbert Batman is scheduled to perform right breast lumpectomy, sentinel node study, and porthacath placement on 01/08/17.  Of note, patient has an appointment scheduled with Dr. Jana Hakim this afternoon at 4pm.  On review of systems, patient anticipates chemotherapy as a part of her treatment. She denies having had any symptoms prior to screening mammogram. She endorses occasional GI upset related to IBS, but is no currently experiencing this.  PREVIOUS RADIATION THERAPY: No  PAST MEDICAL HISTORY:  has a past medical history of Diverticulosis, Hypertension, IBS (irritable bowel syndrome), Iron deficiency, and PONV (postoperative nausea and vomiting).    PAST SURGICAL HISTORY: Past Surgical History:  Procedure Laterality Date  . LAPAROSCOPIC SIGMOID COLECTOMY      FAMILY HISTORY: family history includes Diverticulitis in her  father; Hyperlipidemia in her mother; Hypertension in her mother; Parkinson's disease in her father; Rheum arthritis in her mother.  SOCIAL HISTORY:  reports that  has never smoked. she has never used smokeless tobacco. She reports that she drinks alcohol. She reports that she does not use drugs.  ALLERGIES: Patient has no known allergies.  MEDICATIONS:  Current Outpatient Medications  Medication Sig Dispense Refill  . calcium carbonate (OS-CAL) 600 MG TABS tablet Take 600 mg by mouth 2 (two) times daily with a meal.    . FLUoxetine (PROZAC) 10 MG capsule Take 20 mg by mouth daily.     Marland Kitchen lisinopril (PRINIVIL,ZESTRIL) 20 MG tablet Take 20 mg by mouth daily.    . Red Yeast Rice 600 MG CAPS Take by mouth.    . cholecalciferol (VITAMIN D) 1000 units tablet Take 2,000 Units by mouth daily.    . Probiotic Product (PROBIOTIC-10 PO) Take by mouth.     No current facility-administered medications for this encounter.     REVIEW OF SYSTEMS: A 10+ POINT REVIEW OF SYSTEMS WAS OBTAINED including neurology, dermatology, psychiatry, cardiac, respiratory, lymph, extremities, GI, GU, Musculoskeletal, constitutional, breasts,  HEENT.  All pertinent positives are noted in the HPI.  All others are negative.   PHYSICAL EXAM:  height is 5' 2" (1.575 m) and weight is 114 lb (51.7 kg). Her temperature is 98.9 F (37.2 C). Her blood pressure is 151/94 (abnormal) and her pulse is 73. Her oxygen saturation is 100%.   General: Alert and oriented, in no acute distress HEENT: Head is normocephalic. Extraocular movements are intact. Oropharynx is clear. Neck: Neck is  supple, no palpable cervical or supraclavicular lymphadenopathy. Heart: Regular in rate and rhythm with no murmurs, rubs, or gallops. Chest: Clear to auscultation bilaterally, with no rhonchi, wheezes, or rales. Abdomen: Soft, nontender, nondistended, with no rigidity or guarding. Extremities: No cyanosis or edema. Lymphatics: see Neck Exam Skin: No  concerning lesions. Musculoskeletal: symmetric strength and muscle tone throughout. Neurologic: Cranial nerves II through XII are grossly intact. No obvious focalities. Speech is fluent. Coordination is intact. Psychiatric: Judgment and insight are intact. Affect is appropriate. Breasts: Small bruise in the lower right breast. No other abnormalities appreciated in the breasts or axillae.  ECOG = 0  0 - Asymptomatic (Fully active, able to carry on all predisease activities without restriction)  1 - Symptomatic but completely ambulatory (Restricted in physically strenuous activity but ambulatory and able to carry out work of a light or sedentary nature. For example, light housework, office work)  2 - Symptomatic, <50% in bed during the day (Ambulatory and capable of all self care but unable to carry out any work activities. Up and about more than 50% of waking hours)  3 - Symptomatic, >50% in bed, but not bedbound (Capable of only limited self-care, confined to bed or chair 50% or more of waking hours)  4 - Bedbound (Completely disabled. Cannot carry on any self-care. Totally confined to bed or chair)  5 - Death   Eustace Pen MM, Creech RH, Tormey DC, et al. [phone]). "Toxicity and response criteria of the [facility]". [place] Oncol. 5 (6): 649-55   LABORATORY DATA:  Lab Results  Component Value Date   WBC 4.8 08/10/2008   HGB 12.4 08/10/2008   HCT 36.8 08/10/2008   MCV 88.1 08/10/2008   PLT 221 08/10/2008   CMP     Component Value Date/Time   NA 138 08/10/2008 0435   K 3.7 08/10/2008 0435   CL 109 08/10/2008 0435   CO2 24 08/10/2008 0435   GLUCOSE 94 08/10/2008 0435   BUN 7 08/10/2008 0435   CREATININE 0.62 08/10/2008 0435   CALCIUM 8.4 08/10/2008 0435   GFRNONAA >60 08/10/2008 0435   GFRAA  08/10/2008 0435    >60        The eGFR has been calculated using the MDRD equation. This calculation has not been validated in all clinical situations. eGFR's  persistently <60 mL/min signify possible Chronic Kidney Disease.         RADIOGRAPHY: US Breast Ltd Uni Right Inc Axilla  Result Date: 12/15/2016 CLINICAL DATA:  Patient recalled from screening for right breast mass EXAM: 2D DIGITAL DIAGNOSTIC RIGHT MAMMOGRAM WITH CAD AND ADJUNCT TOMO ULTRASOUND RIGHT BREAST COMPARISON:  Previous exam(s). ACR Breast Density Category d: The breast tissue is extremely dense, which lowers the sensitivity of mammography. FINDINGS: Within the inferior aspect of the right breast anterior depth there is a new small lobular mass, further evaluated with spot compression CC and MLO tomosynthesis images. Mammographic images were processed with CAD. On physical exam, I palpate no discrete mass within the inferior aspect of the right breast. Targeted ultrasound is performed, showing a 5 x 8 x 8 mm lobular hypoechoic mass right breast 7 o'clock position 2 cm from the nipple. No right axillary lymphadenopathy. IMPRESSION: New suspicious right breast mass. RECOMMENDATION: Ultrasound-guided core needle biopsy right breast mass. I have discussed the findings and recommendations with the patient. Results were also provided in writing at the conclusion of the visit. If applicable, a reminder letter will be sent to  the patient regarding the next appointment. BI-RADS CATEGORY  4: Suspicious. Electronically Signed   By: Lovey Newcomer M.D.   On: 12/15/2016 10:23   Mm Diag Breast Tomo Uni Right  Result Date: 12/15/2016 CLINICAL DATA:  Patient recalled from screening for right breast mass EXAM: 2D DIGITAL DIAGNOSTIC RIGHT MAMMOGRAM WITH CAD AND ADJUNCT TOMO ULTRASOUND RIGHT BREAST COMPARISON:  Previous exam(s). ACR Breast Density Category d: The breast tissue is extremely dense, which lowers the sensitivity of mammography. FINDINGS: Within the inferior aspect of the right breast anterior depth there is a new small lobular mass, further evaluated with spot compression CC and MLO tomosynthesis  images. Mammographic images were processed with CAD. On physical exam, I palpate no discrete mass within the inferior aspect of the right breast. Targeted ultrasound is performed, showing a 5 x 8 x 8 mm lobular hypoechoic mass right breast 7 o'clock position 2 cm from the nipple. No right axillary lymphadenopathy. IMPRESSION: New suspicious right breast mass. RECOMMENDATION: Ultrasound-guided core needle biopsy right breast mass. I have discussed the findings and recommendations with the patient. Results were also provided in writing at the conclusion of the visit. If applicable, a reminder letter will be sent to the patient regarding the next appointment. BI-RADS CATEGORY  4: Suspicious. Electronically Signed   By: Lovey Newcomer M.D.   On: 12/15/2016 10:23   Mm Screening Breast Tomo Bilateral  Result Date: 12/12/2016 CLINICAL DATA:  Screening. EXAM: 2D DIGITAL SCREENING BILATERAL MAMMOGRAM WITH CAD AND ADJUNCT TOMO COMPARISON:  Previous exam(s). ACR Breast Density Category c: The breast tissue is heterogeneously dense, which may obscure small masses. FINDINGS: In the right breast, a possible mass warrants further evaluation. In the left breast, no findings suspicious for malignancy. Images were processed with CAD. IMPRESSION: Further evaluation is suggested for possible mass in the right breast. RECOMMENDATION: Diagnostic mammogram and possibly ultrasound of the right breast. (Code:[id number]) The patient will be contacted regarding the findings, and additional imaging will be scheduled. BI-RADS CATEGORY  0: Incomplete. Need additional imaging evaluation and/or prior mammograms for comparison. Electronically Signed   By: Lovey Newcomer M.D.   On: 12/12/2016 11:51   Mm Clip Placement Right  Result Date: 12/15/2016 CLINICAL DATA:  Status post ultrasound-guided biopsy right breast mass. EXAM: DIAGNOSTIC RIGHT MAMMOGRAM POST ULTRASOUND BIOPSY COMPARISON:  Previous exam(s). FINDINGS: Mammographic images were obtained  following ultrasound guided biopsy of right breast mass. Ribbon shaped marking clip in appropriate position. IMPRESSION: Appropriate position ribbon shaped marking clip status post ultrasound-guided biopsy right breast mass. Final Assessment: Post Procedure Mammograms for Marker Placement Electronically Signed   By: Lovey Newcomer M.D.   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 This was found to be concordant by Dr. Lovey Newcomer. Pathology results were discussed with the patient by telephone. The patient reported doing well after the biopsy. Post biopsy instructions and care were reviewed and questions were answered. The patient was encouraged to call [facility] for any additional concerns. At the request of the patient, surgical consultation has been arranged with Dr. Fanny Skates at [facility] on December 26, 2016. Pathology results reported by Susa Raring RN, BSN on 12/19/2016. Electronically Signed   By: Lovey Newcomer M.D.   On:  12/19/2016 08:49   Result Date: 12/19/2016 CLINICAL DATA:  Patient with indeterminate right breast mass. EXAM: ULTRASOUND GUIDED RIGHT BREAST CORE NEEDLE BIOPSY COMPARISON:  Previous exam(s). FINDINGS: I met with the patient and we discussed the procedure of ultrasound-guided biopsy, including benefits and alternatives. We discussed the high likelihood of a successful procedure. We discussed the risks of the procedure, including infection, bleeding, tissue injury, clip migration, and inadequate sampling. Informed written consent was given. The usual time-out protocol was performed immediately prior to the procedure. Lesion quadrant: Lower outer quadrant Using sterile technique and 1% Lidocaine as local anesthetic,  under direct ultrasound visualization, a 12 gauge spring-loaded device was used to perform biopsy of right breast mass 7 o'clock position using a lateral approach. At the conclusion of the procedure a ribbon shaped tissue marker clip was deployed into the biopsy cavity. Follow up 2 view mammogram was performed and dictated separately. IMPRESSION: Ultrasound guided biopsy of right breast mass. No apparent complications. Electronically Signed: By: Lovey Newcomer M.D. On: 12/15/2016 11:44      IMPRESSION/PLAN: Stage IA T1b N0 M0 right breast LOQ invasive ductal carcinoma, ER positive, PR positive, Her2 positive, Grade III   It was a pleasure meeting the patient today. We discussed the risks, benefits, and side effects of radiotherapy. I recommend radiotherapy to the right breast to reduce her risk of locoregional recurrence by 2/3.  We discussed that radiation would take approximately 4 weeks to complete and that I would give the patient a few weeks to heal following surgery before starting treatment planning. If chemotherapy were to be given (anticipated per tumor board), this would precede radiotherapy. We spoke about acute effects including skin irritation and fatigue as well as much less common late effects including internal organ injury or irritation. We spoke about the latest technology that is used to minimize the risk of late effects for patients undergoing radiotherapy to the breast or chest wall. No guarantees of treatment were given. The patient is enthusiastic about proceeding with treatment. I look forward to participating in the patient's care.  I will await her referral back to me for postoperative follow-up and eventual CT simulation/treatment planning. Consept form signed today.   __________________________________________   Eppie Gibson, MD  This document serves as a record of services personally performed by Eppie Gibson, MD. It was created on his behalf by Linward Natal, a trained medical  scribe. The creation of this record is based on the scribe's personal observations and the provider's statements to them. This document has been checked and approved by the attending provider.

## 2017-01-02 NOTE — Progress Notes (Signed)
START ON PATHWAY REGIMEN - Breast   Paclitaxel Weekly + Trastuzumab Weekly:   Administer weekly:     Paclitaxel      Trastuzumab      Trastuzumab   **Always confirm dose/schedule in your pharmacy ordering system**    Trastuzumab (Maintenance - NO Loading Dose):   A cycle is every 21 days:     Trastuzumab   **Always confirm dose/schedule in your pharmacy ordering system**    Patient Characteristics: Postoperative without Neoadjuvant Therapy (Pathologic Staging), Invasive Disease, Adjuvant Therapy, HER2 Positive, ER Positive, Node Negative, pT1b, pN0/N88m, Chemotherapy Indicated Therapeutic Status: Postoperative without Neoadjuvant Therapy (Pathologic Staging) AJCC Grade: G3 AJCC N Category: pN0 AJCC M Category: cM0 ER Status: Positive (+) AJCC 8 Stage Grouping: IA HER2 Status: Positive (+) Oncotype Dx Recurrence Score: Not Appropriate AJCC T Category: pT1b PR Status: Positive (+) Intervention Indicated: Chemotherapy Intent of Therapy: Curative Intent, Discussed with Patient

## 2017-01-03 ENCOUNTER — Telehealth: Payer: Self-pay | Admitting: *Deleted

## 2017-01-03 ENCOUNTER — Encounter (HOSPITAL_BASED_OUTPATIENT_CLINIC_OR_DEPARTMENT_OTHER)
Admission: RE | Admit: 2017-01-03 | Discharge: 2017-01-03 | Disposition: A | Discharge status: Home / Self Care | Payer: 59 | Source: Ambulatory Visit | Attending: General Surgery | Admitting: General Surgery

## 2017-01-03 DIAGNOSIS — I10 Essential (primary) hypertension: Secondary | ICD-10-CM | POA: Insufficient documentation

## 2017-01-03 LAB — COMPREHENSIVE METABOLIC PANEL
ALBUMIN: 3.9 g/dL (ref 3.5–5.0)
ALT: 18 U/L (ref 14–54)
ANION GAP: 9 (ref 5–15)
AST: 20 U/L (ref 15–41)
Alkaline Phosphatase: 55 U/L (ref 38–126)
BUN: 17 mg/dL (ref 6–20)
CO2: 27 mmol/L (ref 22–32)
Calcium: 9.4 mg/dL (ref 8.9–10.3)
Chloride: 101 mmol/L (ref 101–111)
Creatinine, Ser: 0.72 mg/dL (ref 0.44–1.00)
GFR calc Af Amer: >60 mL/min (ref >60)
GFR calc non Af Amer: >60 mL/min (ref >60)
Glucose, Bld: 130 mg/dL — ABNORMAL HIGH (ref 65–99)
POTASSIUM: 4.6 mmol/L (ref 3.5–5.1)
SODIUM: 137 mmol/L (ref 135–145)
Total Bilirubin: 0.7 mg/dL (ref 0.3–1.2)
Total Protein: 6.8 g/dL (ref 6.5–8.1)

## 2017-01-03 LAB — CBC WITH DIFFERENTIAL/PLATELET
BASOS PCT: 0 %
Basophils Absolute: 0 10*3/uL (ref 0.0–0.1)
EOS ABS: 0.2 10*3/uL (ref 0.0–0.7)
Eosinophils Relative: 2 %
HCT: 41.7 % (ref 36.0–46.0)
Hemoglobin: 14.2 g/dL (ref 12.0–15.0)
Lymphocytes Relative: 23 %
Lymphs Abs: 1.8 10*3/uL (ref 0.7–4.0)
MCH: 29 pg (ref 26.0–34.0)
MCHC: 34.1 g/dL (ref 30.0–36.0)
MCV: 85.3 fL (ref 78.0–100.0)
MONO ABS: 0.6 10*3/uL (ref 0.1–1.0)
MONOS PCT: 7 %
Neutro Abs: 5.3 10*3/uL (ref 1.7–7.7)
Neutrophils Relative %: 68 %
PLATELETS: 255 10*3/uL (ref 150–400)
RBC: 4.89 MIL/uL (ref 3.87–5.11)
RDW: 12.5 % (ref 11.5–15.5)
WBC: 7.8 10*3/uL (ref 4.0–10.5)

## 2017-01-03 NOTE — Progress Notes (Signed)
Ensure pre surgery drink given with instructions to complete by [place], hibiclens soap given with instructions, pt verbalized understanding.

## 2017-01-03 NOTE — Telephone Encounter (Signed)
Called pt to provided navigation resources and contact information. Left vm requesting return call with contact information

## 2017-01-04 ENCOUNTER — Telehealth: Payer: Self-pay | Admitting: Adult Health

## 2017-01-04 NOTE — Telephone Encounter (Signed)
Scheduled appts per 12/4 sch msg - spoke with patient regarding appts that were added. Sending confirmation letter as well.

## 2017-01-05 ENCOUNTER — Ambulatory Visit
Admission: RE | Admit: 2017-01-05 | Discharge: 2017-01-05 | Disposition: A | Discharge status: Home / Self Care | Payer: 59 | Source: Ambulatory Visit | Attending: General Surgery | Admitting: General Surgery

## 2017-01-05 DIAGNOSIS — Z170 Estrogen receptor positive status [ER+]: Principal | ICD-10-CM

## 2017-01-05 DIAGNOSIS — C50911 Malignant neoplasm of unspecified site of right female breast: Secondary | ICD-10-CM | POA: Diagnosis not present

## 2017-01-05 DIAGNOSIS — D0511 Intraductal carcinoma in situ of right breast: Secondary | ICD-10-CM | POA: Diagnosis not present

## 2017-01-05 DIAGNOSIS — C50511 Malignant neoplasm of lower-outer quadrant of right female breast: Secondary | ICD-10-CM

## 2017-01-06 NOTE — H&P (Signed)
Katie Marquez Location: [facility] Patient #: [id number] DOB: 24-Sep-1955 Married / Language: English / Race: White Female       History of Present Illness       The patient is a 61 year old female who presents with breast cancer. This is a very pleasant, healthy, 61 year old female. She is here with her husband and a close friend who has had breast cancer. She is referred by Dr. Lovey Newcomer at the [facility] for evaluation and management of a new cancer in the right breast. Dr. Stephanie Acre is her PCP. Dr. Allyn Kenner is her gastroenterologist.       She has no prior breast problems. Recent screening mammogram show an 8 mm lobular mass in the right breast at the 7 o'clock position, states 2 cm from the nipple. Axillary ultrasound negative. 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 ER 95%. PR 50%. Ki-67 15%. We talked about local control issues with surgery and radiation therapy. We talked about systemic issues with antiestrogen therapy and possible chemotherapy due to her HER-2 positive status. We discussed management options of lumpectomy with radioactive seed, sentinel node mapping and biopsy, mastectomy with or without reconstruction. She is clearly motivated for breast conservation and I think she is a excellent candidate for that.       She was discussed in breast conference on November 28 and Dr. Jana Hakim recommended Port-A-Cath and adjuvant chemotherapy. This was a consensus recommendation.     Comorbidities include history of sigmoid colectomy for diverticulitis by Dr. Harlow Asa in 2006. She occasionally has some flareups. Followed by Dr. Earlean Shawl. Essential tremor. Hypertension. 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 She is on hormone replacement therapy and was told to stop that. Family history is negative for breast or ovarian cancer. Mother died with rheumatoid arthritis, hypertension. Father died with diverticulitis and  Parkinson's disease socialhistory reveals she is married. 1 child. Denies tobacco. Alcohol occasionally both she and her husband had MVA degrees from [employer].  An important social issue is engagement party for her daughter in [place] December 21. She is hoping that surgery can be completed by then and she can attend the party. I told her this is possible but could not guarantee it      She will be scheduled for a right breast lumpectomy with radioactive seed localization, right axillary sentinel lymph node biopsy, Port-A-Cath insertion with ultrasound. I have discussed indications, details, techniques, and numerous risk of the surgery. She is aware of the risk of bleeding, infection, reoperation for positive margins or multiple positive nodes, cosmetic deformity requiring reoperation, nerve damage with chronic pain, shoulder disability, arm swelling and arm numbness, pneumothorax, difficult Port-A-Cath insertion requiring more than one attempt, vascular injury. She understands all of these issues. All of her questions were answered. She agrees with this plan.   Past Surgical History  Breast Biopsy  Right. Colon Removal - Partial  Resection of Small Bowel   Diagnostic Studies History  Colonoscopy  1-5 years ago Mammogram  within last year Pap Smear  1-5 years ago  Allergies  No Known Allergies   Medication History FLUoxetine HCl (20MG Capsule, Oral) Active. Lisinopril (20MG Tablet, Oral) Active. Progesterone Micronized (100MG Capsule, Oral) Active. Vivelle-Dot (0.025MG/24HR Patch TW, Transdermal) Active. Red Yeast Rice (Oral) Specific strength unknown - Active. Calcium (600MG Tablet, Oral) Active. Probiotic Daily (Oral) Active. Vitamin D (2000UNIT Capsule, Oral) Active. Medications Reconciled  Social History  Caffeine use  Coffee. Tobacco use  Never smoker.  Family History  Arthritis  Mother. Cancer  Father, Mother. Hypertension  Mother. Migraine  Headache  Daughter.  Pregnancy / Birth History  Age at menarche  10 years. Age of menopause  40-55 Gravida  1 Length (months) of breastfeeding  12-24 Maternal age  24-35 Para  1  Other Problems  Breast Cancer  Diverticulosis  Hemorrhoids  High blood pressure  Lump In Breast     Review of Systems  General Not Present- Appetite Loss, Chills, Fatigue, Fever, Night Sweats, Weight Gain and Weight Loss. Skin Not Present- Change in Wart/Mole, Dryness, Hives, Jaundice, New Lesions, Non-Healing Wounds, Rash and Ulcer. HEENT Present- Wears glasses/contact lenses. Not Present- Earache, Hearing Loss, Hoarseness, Nose Bleed, Oral Ulcers, Ringing in the Ears, Seasonal Allergies, Sinus Pain, Sore Throat, Visual Disturbances and Yellow Eyes. Respiratory Not Present- Bloody sputum, Chronic Cough, Difficulty Breathing, Snoring and Wheezing. Breast Present- Breast Mass. Not Present- Breast Pain, Nipple Discharge and Skin Changes. Cardiovascular Not Present- Chest Pain, Difficulty Breathing Lying Down, Leg Cramps, Palpitations, Rapid Heart Rate, Shortness of Breath and Swelling of Extremities. Gastrointestinal Not Present- Abdominal Pain, Bloating, Bloody Stool, Change in Bowel Habits, Chronic diarrhea, Constipation, Difficulty Swallowing, Excessive gas, Gets full quickly at meals, Hemorrhoids, Indigestion, Nausea, Rectal Pain and Vomiting. Female Genitourinary Not Present- Frequency, Nocturia, Painful Urination, Pelvic Pain and Urgency. Musculoskeletal Not Present- Back Pain, Joint Pain, Joint Stiffness, Muscle Pain, Muscle Weakness and Swelling of Extremities. Neurological Not Present- Decreased Memory, Fainting, Headaches, Numbness, Seizures, Tingling, Tremor, Trouble walking and Weakness. Psychiatric Not Present- Anxiety, Bipolar, Change in Sleep Pattern, Depression, Fearful and Frequent crying. Endocrine Not Present- Cold Intolerance, Excessive Hunger, Hair Changes, Heat Intolerance, Hot  flashes and New Diabetes. Hematology Not Present- Blood Thinners, Easy Bruising, Excessive bleeding, Gland problems, HIV and Persistent Infections.  Vitals Weight: 114.8 lb Height: 62in Body Surface Area: 1.51 m Body Mass Index: 21 kg/m  Temp.: 98.38F  Pulse: 71 (Regular)  BP: 140/90 (Sitting, Left Arm, Standard)    Physical Exam  General Mental Status-Alert. General Appearance-Consistent with stated age. Hydration-Well hydrated. Voice-Normal.  Head and Neck Head-normocephalic, atraumatic with no lesions or palpable masses. Trachea-midline. Thyroid Gland Characteristics - normal size and consistency.  Eye Eyeball - Bilateral-Extraocular movements intact. Sclera/Conjunctiva - Bilateral-No scleral icterus.  Chest and Lung Exam Chest and lung exam reveals -quiet, even and easy respiratory effort with no use of accessory muscles and on auscultation, normal breath sounds, no adventitious sounds and normal vocal resonance. Inspection Chest Wall - Normal. Back - normal.  Breast Note: Breast her medium size. Not large. Some ecchymoses inferiorly on the right. I think can can palpate the right sided small tumor at the 7 o'clock position about 3 cm peripheral to the areolar margin. No other mass in either breast. No other skin changes. No axillary adenopathy.   Cardiovascular Cardiovascular examination reveals -normal heart sounds, regular rate and rhythm with no murmurs and normal pedal pulses bilaterally.  Abdomen Inspection Inspection of the abdomen reveals - No Hernias. Skin - Scar - Note: Healed lower midline incision. No mass or tenderness. Palpation/Percussion Palpation and Percussion of the abdomen reveal - Soft, Non Tender, No Rebound tenderness, No Rigidity (guarding) and No hepatosplenomegaly. Auscultation Auscultation of the abdomen reveals - Bowel sounds normal.  Neurologic Neurologic evaluation reveals -alert and oriented x 3  with no impairment of recent or remote memory. Mental Status-Normal.  Musculoskeletal Normal Exam - Left-Upper Extremity Strength Normal and Lower Extremity Strength Normal. Normal Exam - Right-Upper Extremity Strength Normal and  Lower Extremity Strength Normal.  Lymphatic Head & Neck  General Head & Neck Lymphatics: Bilateral - Description - Normal. Axillary  General Axillary Region: Bilateral - Description - Normal. Tenderness - Non Tender. Femoral & Inguinal  Generalized Femoral & Inguinal Lymphatics: Bilateral - Description - Normal. Tenderness - Non Tender.    Assessment & Plan  PRIMARY CANCER OF LOWER-INNER QUADRANT OF RIGHT FEMALE BREAST (C50.311)   Your recent imaging studies and biopsy show and invasive ductal carcinoma of the right breast, lower outer quadrant The tumor is grade 3 The tumor is estrogen receptor positive The tumor is HER-2 positive Ultrasound of the right axilla is normal  I think I can feel the small tumor There are no palpable lymph nodes  We have discussed options for surgical management. You prefer breast conservation surgery and I think you are an excellent candidate for that He will need to be referred to medical oncology and radiation oncology and we will try to expedite that tomorrow morning We will discuss your case at breast conference tomorrow morning  Discontinue all hormone replacement therapy immediately  Because of family plans and family event in [place] on December 21,, you would like to expedite the surgery We will therefore concurrently and urgently schedule you for a right breast lumpectomy with radioactive seed localization, and right axillary sentinel lymph node biopsy I have discussed the indications, techniques, and risk of the surgery in detail with you and your husband  Dr. Jana Hakim hasdecided to give you chemotherapy, so we will also insert a Port-A-Cath.  Addendum: Discussion in breast conference on Wednesday,  Nov. 28 was that she will need chemotherapy. The recommendation was to insert Port-A-Cath at the time of her definitive surgery.   HYPERTENSION, ESSENTIAL (I10) HORMONE REPLACEMENT THERAPY (Z79.890) ESSENTIAL TREMOR (G25.0) HISTORY OF PARTIAL COLECTOMY (Z90.49) Impression: Diverticulitis. Dr. Harlow Asa. 2006    Edsel Petrin. Dalbert Batman, M.D., [facility] General and Minimally invasive Surgery Breast and Colorectal Surgery Office:   [phone] Pager:   [phone]

## 2017-01-08 ENCOUNTER — Ambulatory Visit (HOSPITAL_COMMUNITY)
Admission: RE | Admit: 2017-01-08 | Discharge: 2017-01-08 | Disposition: A | Discharge status: Home / Self Care | Payer: 59 | Source: Ambulatory Visit | Attending: General Surgery | Admitting: General Surgery

## 2017-01-08 ENCOUNTER — Ambulatory Visit (HOSPITAL_COMMUNITY): Payer: 59

## 2017-01-08 ENCOUNTER — Ambulatory Visit (HOSPITAL_COMMUNITY): Payer: 59 | Admitting: Anesthesiology

## 2017-01-08 ENCOUNTER — Ambulatory Visit (HOSPITAL_BASED_OUTPATIENT_CLINIC_OR_DEPARTMENT_OTHER)
Admission: RE | Admit: 2017-01-08 | Discharge: 2017-01-08 | Disposition: A | Discharge status: Home / Self Care | Payer: 59 | Source: Ambulatory Visit | Attending: General Surgery | Admitting: General Surgery

## 2017-01-08 ENCOUNTER — Encounter (HOSPITAL_COMMUNITY): Payer: Self-pay

## 2017-01-08 ENCOUNTER — Ambulatory Visit
Admission: RE | Admit: 2017-01-08 | Discharge: 2017-01-08 | Disposition: A | Discharge status: Home / Self Care | Payer: 59 | Source: Ambulatory Visit | Attending: General Surgery | Admitting: General Surgery

## 2017-01-08 ENCOUNTER — Other Ambulatory Visit: Payer: Self-pay

## 2017-01-08 ENCOUNTER — Encounter (HOSPITAL_COMMUNITY)
Admission: RE | Disposition: A | Discharge status: Home / Self Care | Payer: Self-pay | Source: Ambulatory Visit | Attending: General Surgery

## 2017-01-08 DIAGNOSIS — Z79899 Other long term (current) drug therapy: Secondary | ICD-10-CM | POA: Insufficient documentation

## 2017-01-08 DIAGNOSIS — C50311 Malignant neoplasm of lower-inner quadrant of right female breast: Secondary | ICD-10-CM | POA: Diagnosis not present

## 2017-01-08 DIAGNOSIS — Z8249 Family history of ischemic heart disease and other diseases of the circulatory system: Secondary | ICD-10-CM | POA: Insufficient documentation

## 2017-01-08 DIAGNOSIS — C50511 Malignant neoplasm of lower-outer quadrant of right female breast: Secondary | ICD-10-CM | POA: Diagnosis not present

## 2017-01-08 DIAGNOSIS — G8918 Other acute postprocedural pain: Secondary | ICD-10-CM | POA: Diagnosis not present

## 2017-01-08 DIAGNOSIS — Z170 Estrogen receptor positive status [ER+]: Principal | ICD-10-CM

## 2017-01-08 DIAGNOSIS — Z452 Encounter for adjustment and management of vascular access device: Secondary | ICD-10-CM | POA: Diagnosis not present

## 2017-01-08 DIAGNOSIS — I10 Essential (primary) hypertension: Secondary | ICD-10-CM | POA: Insufficient documentation

## 2017-01-08 DIAGNOSIS — Z1501 Genetic susceptibility to malignant neoplasm of breast: Secondary | ICD-10-CM | POA: Insufficient documentation

## 2017-01-08 DIAGNOSIS — C50911 Malignant neoplasm of unspecified site of right female breast: Secondary | ICD-10-CM | POA: Diagnosis not present

## 2017-01-08 DIAGNOSIS — D0511 Intraductal carcinoma in situ of right breast: Secondary | ICD-10-CM | POA: Diagnosis not present

## 2017-01-08 DIAGNOSIS — G250 Essential tremor: Secondary | ICD-10-CM | POA: Insufficient documentation

## 2017-01-08 DIAGNOSIS — Z95828 Presence of other vascular implants and grafts: Secondary | ICD-10-CM

## 2017-01-08 DIAGNOSIS — Z419 Encounter for procedure for purposes other than remedying health state, unspecified: Secondary | ICD-10-CM

## 2017-01-08 HISTORY — DX: Cardiac murmur, unspecified: R01.1

## 2017-01-08 HISTORY — DX: Other specified postprocedural states: R11.2

## 2017-01-08 HISTORY — DX: Malignant (primary) neoplasm, unspecified: C80.1

## 2017-01-08 HISTORY — PX: BREAST LUMPECTOMY WITH RADIOACTIVE SEED AND SENTINEL LYMPH NODE BIOPSY: SHX6550

## 2017-01-08 HISTORY — DX: Other specified postprocedural states: Z98.890

## 2017-01-08 HISTORY — DX: Tremor, unspecified: R25.1

## 2017-01-08 HISTORY — PX: PORTACATH PLACEMENT: SHX2246

## 2017-01-08 HISTORY — DX: Unspecified osteoarthritis, unspecified site: M19.90

## 2017-01-08 HISTORY — DX: Anxiety disorder, unspecified: F41.9

## 2017-01-08 SURGERY — BREAST LUMPECTOMY WITH RADIOACTIVE SEED AND SENTINEL LYMPH NODE BIOPSY
Anesthesia: General | Site: Chest | Laterality: Right

## 2017-01-08 MED ORDER — SODIUM CHLORIDE 0.9 % IJ SOLN
INTRAMUSCULAR | Status: AC
Start: 2017-01-08 — End: 2017-01-08
  Filled 2017-01-08: qty 10

## 2017-01-08 MED ORDER — CEFAZOLIN SODIUM-DEXTROSE 2-4 GM/100ML-% IV SOLN
INTRAVENOUS | Status: AC
  Filled 2017-01-08: qty 100

## 2017-01-08 MED ORDER — LACTATED RINGERS IV SOLN: INTRAVENOUS | Status: DC

## 2017-01-08 MED ORDER — PROPOFOL 10 MG/ML IV BOLUS
INTRAVENOUS | Status: AC
  Filled 2017-01-08: qty 20

## 2017-01-08 MED ORDER — GABAPENTIN 300 MG PO CAPS
ORAL_CAPSULE | ORAL | Status: AC
  Administered 2017-01-08: 300 mg via ORAL
  Filled 2017-01-08: qty 1

## 2017-01-08 MED ORDER — HYDROCODONE-ACETAMINOPHEN 5-325 MG PO TABS
ORAL_TABLET | ORAL | Status: AC
  Filled 2017-01-08: qty 1

## 2017-01-08 MED ORDER — PROMETHAZINE HCL 25 MG/ML IJ SOLN
INTRAMUSCULAR | Status: DC | PRN
  Administered 2017-01-08: 6.25 mg via INTRAVENOUS

## 2017-01-08 MED ORDER — PHENYLEPHRINE HCL 10 MG/ML IJ SOLN: INTRAMUSCULAR | Status: DC | PRN

## 2017-01-08 MED ORDER — DEXAMETHASONE SODIUM PHOSPHATE 10 MG/ML IJ SOLN
INTRAMUSCULAR | Status: DC | PRN
  Administered 2017-01-08: 5 mg via INTRAVENOUS

## 2017-01-08 MED ORDER — PHENYLEPHRINE HCL 10 MG/ML IJ SOLN
INTRAMUSCULAR | Status: DC | PRN
  Administered 2017-01-08: 20 ug/min via INTRAVENOUS

## 2017-01-08 MED ORDER — CHLORHEXIDINE GLUCONATE CLOTH 2 % EX PADS: 6.0000 | MEDICATED_PAD | Freq: Once | CUTANEOUS | Status: DC

## 2017-01-08 MED ORDER — SODIUM CHLORIDE 0.9 % IV SOLN
INTRAVENOUS | Status: DC | PRN
  Administered 2017-01-08: 500 mL

## 2017-01-08 MED ORDER — METHYLENE BLUE 0.5 % INJ SOLN
INTRAVENOUS | Status: AC
  Filled 2017-01-08: qty 10

## 2017-01-08 MED ORDER — BUPIVACAINE-EPINEPHRINE (PF) 0.5% -1:200000 IJ SOLN
INTRAMUSCULAR | Status: DC | PRN
  Administered 2017-01-08: 30 mL via PERINEURAL

## 2017-01-08 MED ORDER — LIDOCAINE 2% (20 MG/ML) 5 ML SYRINGE
INTRAMUSCULAR | Status: AC
  Filled 2017-01-08: qty 5

## 2017-01-08 MED ORDER — LIDOCAINE 2% (20 MG/ML) 5 ML SYRINGE
INTRAMUSCULAR | Status: DC | PRN
  Administered 2017-01-08: 100 mg via INTRAVENOUS

## 2017-01-08 MED ORDER — SODIUM CHLORIDE 0.9% FLUSH: 3.0000 mL | INTRAVENOUS | Status: DC | PRN

## 2017-01-08 MED ORDER — LACTATED RINGERS IV SOLN
INTRAVENOUS | Status: DC
  Administered 2017-01-08 (×2): via INTRAVENOUS

## 2017-01-08 MED ORDER — FENTANYL CITRATE (PF) 250 MCG/5ML IJ SOLN
INTRAMUSCULAR | Status: AC
  Filled 2017-01-08: qty 5

## 2017-01-08 MED ORDER — ONDANSETRON HCL 4 MG/2ML IJ SOLN
INTRAMUSCULAR | Status: DC | PRN
  Administered 2017-01-08: 4 mg via INTRAVENOUS

## 2017-01-08 MED ORDER — CELECOXIB 100 MG PO CAPS
100.0000 mg | ORAL_CAPSULE | ORAL | Status: AC
  Administered 2017-01-08: 100 mg via ORAL
  Filled 2017-01-08: qty 1

## 2017-01-08 MED ORDER — MIDAZOLAM HCL 5 MG/5ML IJ SOLN: INTRAMUSCULAR | Status: DC | PRN

## 2017-01-08 MED ORDER — ONDANSETRON HCL 4 MG/2ML IJ SOLN
INTRAMUSCULAR | Status: AC
Start: 2017-01-08 — End: 2017-01-08
  Filled 2017-01-08: qty 2

## 2017-01-08 MED ORDER — HYDROCODONE-ACETAMINOPHEN 5-325 MG PO TABS: 1.0000 | ORAL_TABLET | Freq: Four times a day (QID) | ORAL | 0 refills | Status: DC | PRN

## 2017-01-08 MED ORDER — BUPIVACAINE-EPINEPHRINE (PF) 0.25% -1:200000 IJ SOLN
INTRAMUSCULAR | Status: AC
  Filled 2017-01-08: qty 30

## 2017-01-08 MED ORDER — CELECOXIB 200 MG PO CAPS
ORAL_CAPSULE | ORAL | Status: AC
  Filled 2017-01-08: qty 1

## 2017-01-08 MED ORDER — SCOPOLAMINE 1 MG/3DAYS TD PT72
MEDICATED_PATCH | TRANSDERMAL | Status: AC
  Administered 2017-01-08: 1.5 mg via TRANSDERMAL
  Filled 2017-01-08: qty 1

## 2017-01-08 MED ORDER — BUPIVACAINE-EPINEPHRINE 0.25% -1:200000 IJ SOLN
INTRAMUSCULAR | Status: DC | PRN
  Administered 2017-01-08 (×2): 8 mL

## 2017-01-08 MED ORDER — PROMETHAZINE HCL 25 MG/ML IJ SOLN
6.2500 mg | INTRAMUSCULAR | Status: DC
  Filled 2017-01-08: qty 1

## 2017-01-08 MED ORDER — FENTANYL CITRATE (PF) 100 MCG/2ML IJ SOLN
50.0000 ug | Freq: Once | INTRAMUSCULAR | Status: AC
  Administered 2017-01-08: 50 ug via INTRAVENOUS
  Filled 2017-01-08: qty 1

## 2017-01-08 MED ORDER — ARTIFICIAL TEARS OPHTHALMIC OINT
TOPICAL_OINTMENT | OPHTHALMIC | Status: AC
Start: 2017-01-08 — End: 2017-01-08
  Filled 2017-01-08: qty 3.5

## 2017-01-08 MED ORDER — ACETAMINOPHEN 325 MG PO TABS: 650.0000 mg | ORAL_TABLET | ORAL | Status: DC | PRN

## 2017-01-08 MED ORDER — KETAMINE HCL-SODIUM CHLORIDE 100-0.9 MG/10ML-% IV SOSY
PREFILLED_SYRINGE | INTRAVENOUS | Status: AC
Start: 2017-01-08 — End: 2017-01-08
  Filled 2017-01-08: qty 10

## 2017-01-08 MED ORDER — ACETAMINOPHEN 500 MG PO TABS
1000.0000 mg | ORAL_TABLET | ORAL | Status: AC
  Administered 2017-01-08: 1000 mg via ORAL

## 2017-01-08 MED ORDER — EPHEDRINE SULFATE-NACL 50-0.9 MG/10ML-% IV SOSY
PREFILLED_SYRINGE | INTRAVENOUS | Status: DC | PRN
  Administered 2017-01-08: 5 mg via INTRAVENOUS
  Administered 2017-01-08 (×3): 10 mg via INTRAVENOUS

## 2017-01-08 MED ORDER — SCOPOLAMINE 1 MG/3DAYS TD PT72
1.0000 | MEDICATED_PATCH | TRANSDERMAL | Status: DC
  Administered 2017-01-08: 1.5 mg via TRANSDERMAL

## 2017-01-08 MED ORDER — MIDAZOLAM HCL 2 MG/2ML IJ SOLN
INTRAMUSCULAR | Status: AC
  Administered 2017-01-08: 2 mg via INTRAVENOUS
  Filled 2017-01-08: qty 2

## 2017-01-08 MED ORDER — SODIUM CHLORIDE 0.9 % IV SOLN
250.0000 mL | INTRAVENOUS | Status: DC | PRN
Start: 2017-01-08 — End: 2017-01-08

## 2017-01-08 MED ORDER — GABAPENTIN 300 MG PO CAPS
300.0000 mg | ORAL_CAPSULE | ORAL | Status: AC
  Administered 2017-01-08: 300 mg via ORAL

## 2017-01-08 MED ORDER — CEFAZOLIN SODIUM-DEXTROSE 2-4 GM/100ML-% IV SOLN
2.0000 g | INTRAVENOUS | Status: AC
  Administered 2017-01-08: 2 g via INTRAVENOUS

## 2017-01-08 MED ORDER — 0.9 % SODIUM CHLORIDE (POUR BTL) OPTIME
TOPICAL | Status: DC | PRN
  Administered 2017-01-08: 1000 mL

## 2017-01-08 MED ORDER — ACETAMINOPHEN 650 MG RE SUPP: 650.0000 mg | RECTAL | Status: DC | PRN

## 2017-01-08 MED ORDER — OXYCODONE HCL 5 MG PO TABS
5.0000 mg | ORAL_TABLET | ORAL | Status: DC | PRN
  Administered 2017-01-08: 5 mg via ORAL

## 2017-01-08 MED ORDER — SODIUM CHLORIDE 0.9 % IJ SOLN
INTRAMUSCULAR | Status: DC | PRN
  Administered 2017-01-08: 5 mL via INTRAMUSCULAR

## 2017-01-08 MED ORDER — HYDROMORPHONE HCL 1 MG/ML IJ SOLN: 0.2500 mg | INTRAMUSCULAR | Status: DC | PRN

## 2017-01-08 MED ORDER — FENTANYL CITRATE (PF) 100 MCG/2ML IJ SOLN
INTRAMUSCULAR | Status: DC | PRN
  Administered 2017-01-08: 50 ug via INTRAVENOUS

## 2017-01-08 MED ORDER — FENTANYL CITRATE (PF) 100 MCG/2ML IJ SOLN: 25.0000 ug | INTRAMUSCULAR | Status: DC | PRN

## 2017-01-08 MED ORDER — TECHNETIUM TC 99M SULFUR COLLOID FILTERED
1.0000 | Freq: Once | INTRAVENOUS | Status: AC | PRN
  Administered 2017-01-08: 1 via INTRADERMAL

## 2017-01-08 MED ORDER — MEPERIDINE HCL 25 MG/ML IJ SOLN: 6.2500 mg | INTRAMUSCULAR | Status: DC | PRN

## 2017-01-08 MED ORDER — ONDANSETRON HCL 4 MG/2ML IJ SOLN: 4.0000 mg | Freq: Once | INTRAMUSCULAR | Status: DC | PRN

## 2017-01-08 MED ORDER — SODIUM CHLORIDE 0.9% FLUSH: 3.0000 mL | Freq: Two times a day (BID) | INTRAVENOUS | Status: DC

## 2017-01-08 MED ORDER — MIDAZOLAM HCL 2 MG/2ML IJ SOLN
2.0000 mg | Freq: Once | INTRAMUSCULAR | Status: AC
  Administered 2017-01-08: 2 mg via INTRAVENOUS
  Filled 2017-01-08: qty 2

## 2017-01-08 MED ORDER — PROPOFOL 10 MG/ML IV BOLUS
INTRAVENOUS | Status: DC | PRN
  Administered 2017-01-08: 150 mg via INTRAVENOUS

## 2017-01-08 MED ORDER — PHENYLEPHRINE 40 MCG/ML (10ML) SYRINGE FOR IV PUSH (FOR BLOOD PRESSURE SUPPORT)
PREFILLED_SYRINGE | INTRAVENOUS | Status: DC | PRN
  Administered 2017-01-08 (×2): 80 ug via INTRAVENOUS
  Administered 2017-01-08: 40 ug via INTRAVENOUS
  Administered 2017-01-08: 80 ug via INTRAVENOUS

## 2017-01-08 MED ORDER — FENTANYL CITRATE (PF) 100 MCG/2ML IJ SOLN
INTRAMUSCULAR | Status: AC
  Administered 2017-01-08: 50 ug via INTRAVENOUS
  Filled 2017-01-08: qty 2

## 2017-01-08 MED ORDER — HEPARIN SOD (PORK) LOCK FLUSH 100 UNIT/ML IV SOLN
INTRAVENOUS | Status: DC | PRN
  Administered 2017-01-08: 500 [IU]

## 2017-01-08 MED ORDER — LIDOCAINE-EPINEPHRINE (PF) 1 %-1:200000 IJ SOLN
INTRAMUSCULAR | Status: AC
  Filled 2017-01-08: qty 30

## 2017-01-08 MED ORDER — IOPAMIDOL (ISOVUE-300) INJECTION 61%
INTRAVENOUS | Status: AC
  Filled 2017-01-08: qty 50

## 2017-01-08 MED ORDER — HEPARIN SOD (PORK) LOCK FLUSH 100 UNIT/ML IV SOLN
INTRAVENOUS | Status: AC
  Filled 2017-01-08: qty 5

## 2017-01-08 MED ORDER — ACETAMINOPHEN 500 MG PO TABS
ORAL_TABLET | ORAL | Status: AC
  Administered 2017-01-08: 1000 mg via ORAL
  Filled 2017-01-08: qty 2

## 2017-01-08 MED ORDER — OXYCODONE HCL 5 MG PO TABS
ORAL_TABLET | ORAL | Status: AC
  Filled 2017-01-08: qty 1

## 2017-01-08 MED FILL — HYDROCODON-APAP 5-325: 5-325 | 4 days supply | Qty: 30 | Fill #0

## 2017-01-08 SURGICAL SUPPLY — 70 items
ADH SKN CLS APL DERMABOND .7 (GAUZE/BANDAGES/DRESSINGS) ×2
APPLIER CLIP 9.375 MED OPEN (MISCELLANEOUS) ×3
APR CLP MED 9.3 20 MLT OPN (MISCELLANEOUS) ×2
BAG DECANTER FOR FLEXI CONT (MISCELLANEOUS) ×3 IMPLANT
BINDER BREAST LRG (GAUZE/BANDAGES/DRESSINGS) IMPLANT
BINDER BREAST XLRG (GAUZE/BANDAGES/DRESSINGS) IMPLANT
BLADE CLIPPER SURG (BLADE) IMPLANT
BLADE SURG 11 STRL SS (BLADE) ×3 IMPLANT
BLADE SURG 15 STRL LF DISP TIS (BLADE) ×4 IMPLANT
BLADE SURG 15 STRL SS (BLADE) ×6
CANISTER SUCT 3000ML PPV (MISCELLANEOUS) ×3 IMPLANT
CHLORAPREP W/TINT 26ML (MISCELLANEOUS) ×3 IMPLANT
CLIP APPLIE 9.375 MED OPEN (MISCELLANEOUS) ×2 IMPLANT
CONT SPEC 4OZ CLIKSEAL STRL BL (MISCELLANEOUS) ×3 IMPLANT
COVER PROBE W GEL 5X96 (DRAPES) ×3 IMPLANT
COVER SURGICAL LIGHT HANDLE (MISCELLANEOUS) ×3 IMPLANT
COVER TRANSDUCER ULTRASND GEL (DRAPE) IMPLANT
CRADLE DONUT ADULT HEAD (MISCELLANEOUS) ×3 IMPLANT
DERMABOND ADVANCED (GAUZE/BANDAGES/DRESSINGS) ×1
DERMABOND ADVANCED .7 DNX12 (GAUZE/BANDAGES/DRESSINGS) ×2 IMPLANT
DEVICE DUBIN SPECIMEN MAMMOGRA (MISCELLANEOUS) ×3 IMPLANT
DRAPE C-ARM 42X72 X-RAY (DRAPES) ×3 IMPLANT
DRAPE CHEST BREAST 15X10 FENES (DRAPES) ×3 IMPLANT
DRAPE HALF SHEET 40X57 (DRAPES) ×3 IMPLANT
DRAPE LAPAROSCOPIC ABDOMINAL (DRAPES) ×3 IMPLANT
DRAPE UTILITY XL STRL (DRAPES) ×6 IMPLANT
DRSG PAD ABDOMINAL 8X10 ST (GAUZE/BANDAGES/DRESSINGS) ×3 IMPLANT
ELECT CAUTERY BLADE 6.4 (BLADE) ×3 IMPLANT
ELECT REM PT RETURN 9FT ADLT (ELECTROSURGICAL) ×3
ELECTRODE REM PT RTRN 9FT ADLT (ELECTROSURGICAL) ×2 IMPLANT
FILTER STRAW FLUID ASPIR (MISCELLANEOUS) IMPLANT
GAUZE SPONGE 4X4 12PLY STRL (GAUZE/BANDAGES/DRESSINGS) ×3 IMPLANT
GAUZE SPONGE 4X4 16PLY XRAY LF (GAUZE/BANDAGES/DRESSINGS) ×3 IMPLANT
GLOVE EUDERMIC 7 POWDERFREE (GLOVE) ×3 IMPLANT
GOWN STRL REUS W/ TWL LRG LVL3 (GOWN DISPOSABLE) ×2 IMPLANT
GOWN STRL REUS W/ TWL XL LVL3 (GOWN DISPOSABLE) ×2 IMPLANT
GOWN STRL REUS W/TWL LRG LVL3 (GOWN DISPOSABLE) ×3
GOWN STRL REUS W/TWL XL LVL3 (GOWN DISPOSABLE) ×3
ILLUMINATOR WAVEGUIDE N/F (MISCELLANEOUS) IMPLANT
INTRODUCER 13FR (MISCELLANEOUS) IMPLANT
INTRODUCER COOK 11FR (CATHETERS) IMPLANT
KIT BASIN OR (CUSTOM PROCEDURE TRAY) ×3 IMPLANT
KIT MARKER MARGIN INK (KITS) ×3 IMPLANT
KIT PORT POWER 8FR ISP CVUE (Miscellaneous) ×3 IMPLANT
KIT ROOM TURNOVER OR (KITS) ×3 IMPLANT
LIGHT WAVEGUIDE WIDE FLAT (MISCELLANEOUS) IMPLANT
NDL SAFETY ECLIPSE 18X1.5 (NEEDLE) IMPLANT
NEEDLE HYPO 18GX1.5 SHARP (NEEDLE)
NEEDLE HYPO 25GX1X1/2 BEV (NEEDLE) ×6 IMPLANT
NS IRRIG 1000ML POUR BTL (IV SOLUTION) ×3 IMPLANT
PACK SURGICAL SETUP 50X90 (CUSTOM PROCEDURE TRAY) ×3 IMPLANT
PAD ARMBOARD 7.5X6 YLW CONV (MISCELLANEOUS) ×3 IMPLANT
PENCIL BUTTON HOLSTER BLD 10FT (ELECTRODE) ×3 IMPLANT
SET INTRODUCER 12FR PACEMAKER (SHEATH) IMPLANT
SET SHEATH INTRODUCER 10FR (MISCELLANEOUS) IMPLANT
SHEATH COOK PEEL AWAY SET 9F (SHEATH) IMPLANT
SPONGE LAP 4X18 X RAY DECT (DISPOSABLE) ×3 IMPLANT
SURGILUBE 3G PEEL PACK STRL (MISCELLANEOUS) IMPLANT
SUT MNCRL AB 4-0 PS2 18 (SUTURE) ×6 IMPLANT
SUT PROLENE 2 0 CT2 30 (SUTURE) ×3 IMPLANT
SUT SILK 2 0 SH (SUTURE) ×3 IMPLANT
SUT VIC AB 3-0 SH 18 (SUTURE) ×3 IMPLANT
SYR 10ML LL (SYRINGE) ×6 IMPLANT
SYR 5ML LUER SLIP (SYRINGE) ×3 IMPLANT
SYR BULB 3OZ (MISCELLANEOUS) ×3 IMPLANT
SYR CONTROL 10ML LL (SYRINGE) ×3 IMPLANT
TOWEL OR 17X24 6PK STRL BLUE (TOWEL DISPOSABLE) ×3 IMPLANT
TOWEL OR 17X26 10 PK STRL BLUE (TOWEL DISPOSABLE) ×3 IMPLANT
TUBE CONNECTING 12X1/4 (SUCTIONS) ×3 IMPLANT
YANKAUER SUCT BULB TIP NO VENT (SUCTIONS) ×3 IMPLANT

## 2017-01-08 NOTE — Discharge Instructions (Signed)
[facility] Office Phone Number [phone]  BREAST BIOPSY/ PARTIAL MASTECTOMY: POST OP INSTRUCTIONS  Always review your discharge instruction sheet given to you by the facility where your surgery was performed.  IF YOU HAVE DISABILITY OR FAMILY LEAVE FORMS, YOU MUST BRING THEM TO THE OFFICE FOR PROCESSING.  DO NOT GIVE THEM TO YOUR DOCTOR.  1. A prescription for pain medication may be given to you upon discharge.  Take your pain medication as prescribed, if needed.  If narcotic pain medicine is not needed, then you may take acetaminophen (Tylenol) or ibuprofen (Advil) as needed. 2. Take your usually prescribed medications unless otherwise directed 3. If you need a refill on your pain medication, please contact your pharmacy.  They will contact our office to request authorization.  Prescriptions will not be filled after 5pm or on week-ends. 4. You should eat very light the first 24 hours after surgery, such as soup, crackers, pudding, etc.  Resume your normal diet the day after surgery. 5. Most patients will experience some swelling and bruising in the breast.  Ice packs and a good support bra will help.  Swelling and bruising can take several days to resolve.  6. It is common to experience some constipation if taking pain medication after surgery.  Increasing fluid intake and taking a stool softener will usually help or prevent this problem from occurring.  A mild laxative (Milk of Magnesia or Miralax) should be taken according to package directions if there are no bowel movements after 48 hours. 7. Unless discharge instructions indicate otherwise, you may remove your bandages 24-48 hours after surgery, and you may shower at that time.  You may have steri-strips (small skin tapes) in place directly over the incision.  These strips should be left on the skin for 7-10 days.  If your surgeon used skin glue on the incision, you may shower in 24 hours.  The glue will flake off over the  next 2-3 weeks.  Any sutures or staples will be removed at the office during your follow-up visit. 8. ACTIVITIES:  You may resume regular daily activities (gradually increasing) beginning the next day.  Wearing a good support bra or sports bra minimizes pain and swelling.  You may have sexual intercourse when it is comfortable. a. You may drive when you no longer are taking prescription pain medication, you can comfortably wear a seatbelt, and you can safely maneuver your car and apply brakes. b. RETURN TO WORK:  ______________________________________________________________________________________ 9. You should see your doctor in the office for a follow-up appointment approximately two weeks after your surgery.  Your doctor's nurse will typically make your follow-up appointment when she calls you with your pathology report.  Expect your pathology report 2-3 business days after your surgery.  You may call to check if you do not hear from us after three days. 10. OTHER INSTRUCTIONS: _______________________________________________________________________________________________ _____________________________________________________________________________________________________________________________________ _____________________________________________________________________________________________________________________________________ _____________________________________________________________________________________________________________________________________  WHEN TO CALL YOUR DOCTOR: 1. Fever over 101.0 2. Nausea and/or vomiting. 3. Extreme swelling or bruising. 4. Continued bleeding from incision. 5. Increased pain, redness, or drainage from the incision.  The clinic staff is available to answer your questions during regular business hours.  Please don't hesitate to call and ask to speak to one of the nurses for clinical concerns.  If you have a medical emergency, go to the nearest  emergency room or call 911.  A surgeon from [facility] is always on call at the hospital.  For further questions, please visit [email or web address]        PORT-A-CATH: POST OP INSTRUCTIONS  Always review your discharge instruction sheet given to you by the facility where your surgery was performed.   1. A prescription for pain medication may be given to you upon discharge. Take your pain medication as prescribed, if needed. If narcotic pain medicine is not needed, then you make take acetaminophen (Tylenol) or ibuprofen (Advil) as needed.  2. Take your usually prescribed medications unless otherwise directed. 3. If you need a refill on your pain medication, please contact our office. All narcotic pain medicine now requires a paper prescription.  Phoned in and fax refills are no longer allowed by law.  Prescriptions will not be filled after 5 pm or on weekends.  4. You should follow a light diet for the remainder of the day after your procedure. 5. Most patients will experience some mild swelling and/or bruising in the area of the incision. It may take several days to resolve. 6. It is common to experience some constipation if taking pain medication after surgery. Increasing fluid intake and taking a stool softener (such as Colace) will usually help or prevent this problem from occurring. A mild laxative (Milk of Magnesia or Miralax) should be taken according to package directions if there are no bowel movements after 48 hours.  7. Unless discharge instructions indicate otherwise, you may remove your bandages 48 hours after surgery, and you may shower at that time. You may have steri-strips (small white skin tapes) in place directly over the incision.  These strips should be left on the skin for 7-10 days.  If your surgeon used Dermabond (skin glue) on the incision, you may shower in 24 hours.  The glue will flake off over the next 2-3 weeks.  8. If your port is left accessed at the  end of surgery (needle left in port), the dressing cannot get wet and should only by changed by a healthcare professional. When the port is no longer accessed (when the needle has been removed), follow step 7.   9. ACTIVITIES:  Limit activity involving your arms for the next 72 hours. Do no strenuous exercise or activity for 1 week. You may drive when you are no longer taking prescription pain medication, you can comfortably wear a seatbelt, and you can maneuver your car. 10.You may need to see your doctor in the office for a follow-up appointment.  Please       check with your doctor.  11.When you receive a new Port-a-Cath, you will get a product guide and        ID card.  Please keep them in case you need them.  WHEN TO CALL YOUR DOCTOR ([phone]): 1. Fever over 101.0 2. Chills 3. Continued bleeding from incision 4. Increased redness and tenderness at the site 5. Shortness of breath, difficulty breathing   The clinic staff is available to answer your questions during regular business hours. Please don't hesitate to call and ask to speak to one of the nurses or medical assistants for clinical concerns. If you have a medical emergency, go to the nearest emergency room or call 911.  A surgeon from [facility] is always on call at the hospital.     For further information, please visit [email or web address]     

## 2017-01-08 NOTE — Op Note (Signed)
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 Dalbert Batman, M.D., FACS  Assistant:                      OR staff  Operative Indications:    This is a very pleasant, healthy, 61 year old female. . She is referred by Dr. Lovey Newcomer at the [facility] for evaluation and management of a new cancer in the right breast. Dr. Stephanie Acre is her PCP. Dr. Allyn Kenner is her gastroenterologist.  Dr. Dr. Jana Hakim and Dr. Isidore Moos are involved in her care       She has no prior breast problems. Recent screening mammogram show an 8 mm lobular mass in the right breast at the 7 o'clock position, states 2 cm from the nipple. Axillary ultrasound negative. 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 ER 95%. PR 50%. Ki-67 15%. We talked about local control issues with surgery and radiation therapy. We talked about systemic issues with antiestrogen therapy and possible chemotherapy due to her HER-2 positive status. We discussed management options of lumpectomy with radioactive seed, sentinel node mapping and biopsy, mastectomy with or without reconstruction. She is clearly motivated for breast conservation and I think she is a excellent candidate for that.       She was discussed in breast conference on November 28 and Dr. Jana Hakim recommended  Port-A-Cath and adjuvant chemotherapy. Genetic testing has been drawn.  This was a consensus recommendation. Family history is negative for breast or ovarian cancer. Mother died with rheumatoid arthritis, hypertension. Father died with diverticulitis and Parkinson's disease      She will be scheduled for a right breast lumpectomy with radioactive seed localization, right axillary sentinel lymph node biopsy, Port-A-Cath insertion with ultrasound. . She agrees with this plan.    Operative Findings:       The Port-A-Cath  was inserted through the left subclavian vein.  The port was implanted very laterally in the upper outer quadrant of the left breast at the patient's request.  The catheter tip was in the superior vena cava near the right atrium.  The port and catheter flushed easily and had excellent blood return at the completion of the procedure.    The small cancer was just below the lower areolar margin and so I made a circumareolar incision inferiorly.  The marker clip and the seed were relatively superficial.  The specimen mammogram looked good with the seed and the marker clip in the center of the specimen.  It should be noted that the tumor was relatively superficial and so I lifted the skin edges back both above and below and so the broad anterior margin is the skin.     I found 4 sentinel lymph nodes.  None of them are grossly abnormal.  Procedure in Detail:          Pectoral block was performed preop by the anesthesiologist.  The nuclear medicine technician injected the technetium into the right breast preop.  The patient was brought to the operating room and underwent general anesthesia with LMA device.  Surgical timeout was performed.  Following alcohol prep I injected 5 mL of blue dye into the right breast, subareolar area, and massaged the breast for a few minutes.  Small roll was placed behind her shoulders and arms were tucked at her sides.  The neck and chest were prepped and draped in  a sterile fashion.  0.5% Marcaine with epinephrine was used as local infiltration anesthetic.      A left subclavian venipuncture was performed with excellent blood return.  I made 2 passes before I got blood return.  A guidewire was inserted without difficulty and fluoroscopy was used and confirmed that the wire was in the superior vena cava near the right atrium.  Using the C-arm I marked a template on the chest wall to help guide positioning of the port catheter so that the tip of the catheter would be near the right atrium and the superior vena cava.  I drew a template on the chest wall for this.  A small incision was made at the wire insertion site.  In the far lateral upper left breast I made a transverse incision.  Dissection was carried down to the pectoralis fascia.  Subcutaneous pocket was created.  Using the tunneling device I passed the catheter from the port pocket site to the wire insertion site.  Using the previously created template on the chest wall I cut the catheter 24.5 cm in length.  The catheter was secured to the port with the locking device and the port and catheter flushed with heparinized saline.  The port was sutured in place with 3 interrupted sutures of 2-0 Prolene.  The dilator and peel-away sheath was inserted over the guidewire into the central venous circulation.  The wire and dilator were removed.  The catheter easily threaded and the peel-away sheath was removed.  I had excellent blood return and the catheter flushed easily.  Fluoroscopy was used and showed that the catheter tip was in the superior vena cava near the right atrium and there is no deformity of the catheter anywhere along its course.  I then flushed the port and catheter with concentrated heparin.  Subcutaneous tissues were closed with 3-0 Vicryl sutures and both skin incisions were closed with subcuticular 4-0 Monocryl and Dermabond.  We then repositioned the patient with both arms out at the sides.  The right  breast and chest wall were extensively prepped and draped.  Using the neoprobe I identified the radioactive seed at the 6:00 position just below the areolar margin.  I then made a circumareolar incision at the areolar margin inferiorly.  Skin flaps were raised superiorly and inferiorly so the anterior margin of the lumpectomy specimen is the skin.  Lumpectomy was performed using the neoprobe and electrocautery.  Specimen was removed and marked with silk sutures and a 6 color ink kit to orient the pathologist.  Specimen mammogram looked very good and the specimen was sent to the lab.  Hemostasis was excellent and achieved with electrocautery.  The wound was irrigated.  5 metal marker clips were placed in the walls the lumpectomy cavity.  The lumpectomy was then closed with 2 layers of 3-0 Vicryl and the skin was closed with a running subcutaneous taken of 4-0 Monocryl and Dermabond.      A transverse incision was made at the hairline of the right axilla.  Dissection was carried down through the subcutaneous tissue.  The clavipectoral fascia was incised.  Using the neoprobe I  identified what I thought was 4 sentinel lymph nodes.  3 of these were hot and blue and one of them was just hot.  These were sent as separate specimens.  Hemostasis was excellent and achieved with  electrocautery and a few metal clips.  The wound was irrigated.  The clavipectoral fascia and the subcutaneous tissues were closed with interrupted 3-0 Vicryl and the skin closed with a running subcuticular suture  of 4-0 Monocryl and Dermabond.  Clean bandages and a breast binder were placed.  The patient tolerated the procedure well was taken to PACU in stable condition.  EBL 25 mL.  Counts correct.  Complications none.  A portable chest x-ray is planned.     Edsel Petrin. Dalbert Batman, M.D., FACS General and Minimally Invasive Surgery Breast and Colorectal Surgery   Addendum: I logged onto the [facility] website and reviewed her prescription  medication history  01/08/2017 1:08 PM

## 2017-01-08 NOTE — Anesthesia Procedure Notes (Signed)
Procedure Name: LMA Insertion Date/Time: 01/08/2017 11:05 AM Performed by: Lowella Dell, CRNA Pre-anesthesia Checklist: Patient identified, Emergency Drugs available, Suction available and Patient being monitored Patient Re-evaluated:Patient Re-evaluated prior to induction Oxygen Delivery Method: Circle System Utilized Preoxygenation: Pre-oxygenation with 100% oxygen Induction Type: IV induction Ventilation: Mask ventilation without difficulty LMA: LMA inserted LMA Size: 4.0 Number of attempts: 1 Airway Equipment and Method: Bite block Placement Confirmation: positive ETCO2 Tube secured with: Tape Dental Injury: Teeth and Oropharynx as per pre-operative assessment

## 2017-01-08 NOTE — Anesthesia Preprocedure Evaluation (Signed)
Anesthesia Evaluation  Patient identified by MRN, date of birth, ID band Patient awake    Reviewed: Allergy & Precautions, NPO status , Patient's Chart, lab work & pertinent test results  History of Anesthesia Complications (+) PONV  Airway Mallampati: I  TM Distance: >3 FB Neck ROM: Full    Dental   Pulmonary    Pulmonary exam normal        Cardiovascular hypertension, Pt. on medications Normal cardiovascular exam     Neuro/Psych Anxiety    GI/Hepatic   Endo/Other    Renal/GU      Musculoskeletal   Abdominal   Peds  Hematology   Anesthesia Other Findings   Reproductive/Obstetrics                             Anesthesia Physical Anesthesia Plan  ASA: II  Anesthesia Plan: General   Post-op Pain Management:    Induction: Intravenous  PONV Risk Score and Plan: 4 or greater and Ondansetron, Dexamethasone, Midazolam and Scopolamine patch - Pre-op  Airway Management Planned: LMA  Additional Equipment:   Intra-op Plan:   Post-operative Plan: Extubation in OR  Informed Consent: I have reviewed the patients History and Physical, chart, labs and discussed the procedure including the risks, benefits and alternatives for the proposed anesthesia with the patient or authorized representative who has indicated his/her understanding and acceptance.     Plan Discussed with: CRNA and Surgeon  Anesthesia Plan Comments:         Anesthesia Quick Evaluation

## 2017-01-08 NOTE — Anesthesia Postprocedure Evaluation (Signed)
Anesthesia Post Note  Patient: PHILAMENA KRAMAR  Procedure(s) Performed: RIGHT BREAST LUMPECTOMY WITH RADIOACTIVE SEED AND RIGHT SENTINEL LYMPH NODE BIOPSY ERAS PATHWAY (Right Breast) INSERTION PORT-A-CATH WITH ULTRA SOUND (Left Chest)     Patient location during evaluation: PACU Anesthesia Type: General Level of consciousness: awake and alert Pain management: pain level controlled Vital Signs Assessment: post-procedure vital signs reviewed and stable Respiratory status: spontaneous breathing, nonlabored ventilation, respiratory function stable and patient connected to nasal cannula oxygen Cardiovascular status: blood pressure returned to baseline and stable Postop Assessment: no apparent nausea or vomiting Anesthetic complications: no    Last Vitals:  Vitals:   01/08/17 1400 01/08/17 1500  BP: 120/76 (!) 138/91  Pulse: 87 91  Resp: 18   Temp:    SpO2: 97% 97%    Last Pain:  Vitals:   01/08/17 1315  TempSrc:   PainSc: Asleep                 Ramonita Koenig DAVID

## 2017-01-08 NOTE — Interval H&P Note (Signed)
History and Physical Interval Note:  01/08/2017 9:16 AM  Katie Marquez  has presented today for surgery, with the diagnosis of invasive cancer right breast  The various methods of treatment have been discussed with the patient and family. After consideration of risks, benefits and other options for treatment, the patient has consented to  Procedure(s) with comments: RIGHT BREAST LUMPECTOMY WITH RADIOACTIVE SEED AND RIGHT SENTINEL LYMPH NODE BIOPSY ERAS PATHWAY (Right) - PEC BLOCK INSERTION PORT-A-CATH WITH ULTRA SOUND (N/A) as a surgical intervention .  The patient's history has been reviewed, patient examined, no change in status, stable for surgery.  I have reviewed the patient's chart and labs.  Questions were answered to the patient's satisfaction.     Adin Hector

## 2017-01-08 NOTE — Transfer of Care (Signed)
Immediate Anesthesia Transfer of Care Note  Patient: JERINE SURLES  Procedure(s) Performed: RIGHT BREAST LUMPECTOMY WITH RADIOACTIVE SEED AND RIGHT SENTINEL LYMPH NODE BIOPSY ERAS PATHWAY (Right Breast) INSERTION PORT-A-CATH WITH ULTRA SOUND (Left Chest)  Patient Location: PACU  Anesthesia Type:General  Level of Consciousness: sedated and drowsy  Airway & Oxygen Therapy: Patient Spontanous Breathing and Patient connected to nasal cannula oxygen  Post-op Assessment: Report given to RN and Post -op Vital signs reviewed and stable  Post vital signs: Reviewed and stable  Last Vitals:  Vitals:   01/08/17 1005 01/08/17 1010  BP: 120/80 121/76  Pulse: 87 86  Resp: 10 13  Temp:    SpO2: 100% 100%    Last Pain:  Vitals:   01/08/17 0852  TempSrc: Oral         Complications: No apparent anesthesia complications

## 2017-01-08 NOTE — Anesthesia Procedure Notes (Signed)
Anesthesia Regional Block: Pectoralis block   Pre-Anesthetic Checklist: ,, timeout performed, Correct Patient, Correct Site, Correct Laterality, Correct Procedure, Correct Position, site marked, Risks and benefits discussed,  Surgical consent,  Pre-op evaluation,  At surgeon's request and post-op pain management  Laterality: Right  Prep: chloraprep       Needles:  Injection technique: Single-shot     Needle Length: 9cm  Needle Gauge: 21     Additional Needles:   Narrative:  Start time: 01/08/2017 9:50 AM End time: 01/08/2017 10:00 AM Injection made incrementally with aspirations every 5 mL.  Performed by: Personally  Anesthesiologist: Lillia Abed, MD  Additional Notes: Monitors applied. Patient sedated. Sterile prep and drape,hand hygiene and sterile gloves were used. Relevant anatomy identified.Needle position confirmed.Local anesthetic injected incrementally after negative aspiration. Local anesthetic spread visualized. Vascular puncture avoided. No complications. Image printed for medical record.The patient tolerated the procedure well.

## 2017-01-09 ENCOUNTER — Encounter (HOSPITAL_COMMUNITY): Payer: Self-pay | Admitting: General Surgery

## 2017-01-10 ENCOUNTER — Other Ambulatory Visit: Payer: Self-pay | Admitting: Oncology

## 2017-01-12 ENCOUNTER — Encounter: Payer: Self-pay | Admitting: *Deleted

## 2017-01-15 ENCOUNTER — Other Ambulatory Visit: Payer: 59

## 2017-01-15 DIAGNOSIS — M9901 Segmental and somatic dysfunction of cervical region: Secondary | ICD-10-CM | POA: Diagnosis not present

## 2017-01-15 DIAGNOSIS — M542 Cervicalgia: Secondary | ICD-10-CM | POA: Diagnosis not present

## 2017-01-17 ENCOUNTER — Other Ambulatory Visit: Payer: 59

## 2017-01-17 ENCOUNTER — Ambulatory Visit (HOSPITAL_COMMUNITY)
Admission: RE | Admit: 2017-01-17 | Discharge: 2017-01-17 | Disposition: A | Discharge status: Home / Self Care | Payer: 59 | Source: Ambulatory Visit | Attending: Oncology | Admitting: Oncology

## 2017-01-17 DIAGNOSIS — Z170 Estrogen receptor positive status [ER+]: Secondary | ICD-10-CM | POA: Diagnosis not present

## 2017-01-17 DIAGNOSIS — C50511 Malignant neoplasm of lower-outer quadrant of right female breast: Secondary | ICD-10-CM | POA: Diagnosis not present

## 2017-01-17 DIAGNOSIS — I5030 Unspecified diastolic (congestive) heart failure: Secondary | ICD-10-CM | POA: Insufficient documentation

## 2017-01-17 NOTE — Progress Notes (Signed)
  Echocardiogram 2D Echocardiogram has been performed.  Darlina Sicilian M 01/17/2017, 9:28 AM

## 2017-01-26 ENCOUNTER — Telehealth: Payer: Self-pay | Admitting: Adult Health

## 2017-01-26 ENCOUNTER — Encounter: Payer: Self-pay | Admitting: *Deleted

## 2017-01-26 ENCOUNTER — Encounter: Payer: Self-pay | Admitting: Oncology

## 2017-01-26 ENCOUNTER — Ambulatory Visit: Payer: 59 | Admitting: Adult Health

## 2017-01-26 ENCOUNTER — Encounter: Payer: Self-pay | Admitting: Adult Health

## 2017-01-26 VITALS — BP 169/106 | HR 76 | Temp 98.5°F | Resp 17 | Ht 62.0 in | Wt 117.7 lb

## 2017-01-26 DIAGNOSIS — C50511 Malignant neoplasm of lower-outer quadrant of right female breast: Secondary | ICD-10-CM | POA: Diagnosis not present

## 2017-01-26 DIAGNOSIS — M8580 Other specified disorders of bone density and structure, unspecified site: Secondary | ICD-10-CM | POA: Diagnosis not present

## 2017-01-26 DIAGNOSIS — Z170 Estrogen receptor positive status [ER+]: Secondary | ICD-10-CM | POA: Diagnosis not present

## 2017-01-26 MED ORDER — PROCHLORPERAZINE MALEATE 10 MG PO TABS: 10.0000 mg | ORAL_TABLET | Freq: Four times a day (QID) | ORAL | 1 refills | Status: DC | PRN

## 2017-01-26 MED ORDER — ONDANSETRON HCL 8 MG PO TABS: 8.0000 mg | ORAL_TABLET | Freq: Two times a day (BID) | ORAL | 1 refills | Status: DC | PRN

## 2017-01-26 MED ORDER — LIDOCAINE-PRILOCAINE 2.5-2.5 % EX CREA: TOPICAL_CREAM | CUTANEOUS | 3 refills | Status: DC

## 2017-01-26 MED ORDER — LORAZEPAM 0.5 MG PO TABS: 0.5000 mg | ORAL_TABLET | Freq: Four times a day (QID) | ORAL | 0 refills | Status: DC | PRN

## 2017-01-26 NOTE — Progress Notes (Signed)
Met w/ pt to introduce myself as her Arboriculturist and to discuss copay assistance w/ [employer].  Pt will get back to me if she would like to apply because she believes she has met her deductible, if so, I will enroll her on Sept 2019 when her insurance renews.  I also informed her of the [employer] and gave her the income requirement.  She stated she exceeds the income requirement so she doesn't qualify for the grant.  She has my card for any questions or concerns she may have in the future.

## 2017-01-26 NOTE — Telephone Encounter (Signed)
No 12/28 los.  

## 2017-01-26 NOTE — Progress Notes (Signed)
[name]  Telephone:([phone]) [phone] Fax:(336) [phone]     ID: [name] DOB: August 06, 1955  MR#: [id number]  ZJQ#:[id number]  Patient Care Team: Jonathon Jordan, MD as PCP - General (Family Medicine) Fanny Skates, MD as Consulting Physician (General Surgery) Magrinat, Virgie Dad, MD as Consulting Physician (Oncology) Richmond Campbell, MD as Consulting Physician (Gastroenterology) Eppie Gibson, MD as Attending Physician (Radiation Oncology) Lovey Newcomer, MD as Attending Physician (Radiology) Bensimhon, Shaune Pascal, MD as Consulting Physician (Cardiology) Molli Posey, MD as Consulting Physician (Obstetrics and Gynecology) Scot Dock, NP OTHER MD:  CHIEF COMPLAINT: Triple positive breast cancer  CURRENT TREATMENT: Paclitaxel and Trastuzumab   HISTORY OF CURRENT ILLNESS: Katie Marquez had bilateral screening mammography with tomography at the breast center 12/12/2016, showing a possible mass in the right breast.  Right breast diagnostic mammography with tomography and right breast ultrasonography 12/15/2016 found the breast density to be category D.  In the lower right breast there was a small lobulated mass which was not palpable.  Ultrasound showed a 0.8 cm mass in the right breast 7:00 radiant 2 cm from the nipple.  The right axilla was benign.  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  The patient's subsequent history is as detailed below.  INTERVAL HISTORY: Katie Marquez is here today to discuss her upcoming treatment.  She is going to start on weekly Taxol and every three week Trastuzumab as adjuvant chemotherapy for her estrogen positive breast cancer.  She is slightly anxious about  treatment today.  She has several questions today about treatment, her medications, and about her right axilla.  She says her right axilla is swollen, but it is slowly improving.    REVIEW OF SYSTEMS: Katie Marquez denies any issues today other than her axilla swelling and fullness.  She does have some pain, and this pain is near her surgical site and it will go down her right lateral arm from time to time.  This swelling is slowly improving.  She is f/u with Dr. Dalbert Batman about this on Monday and to review her final pathology.  Otherwise, a detailed ROS is non contributory.     PAST MEDICAL HISTORY: Past Medical History:  Diagnosis Date  . Anxiety   . Cancer [facility])    breast cancer  . Diverticulosis   . Heart murmur   . Hypertension   . IBS (irritable bowel syndrome)   . Iron deficiency   . Osteoarthritis   . PONV (postoperative nausea and vomiting)   . Tremors of nervous system    essential tremors    PAST SURGICAL HISTORY: Past Surgical History:  Procedure Laterality Date  . BREAST LUMPECTOMY WITH RADIOACTIVE SEED AND SENTINEL LYMPH NODE BIOPSY Right 01/08/2017   Procedure: RIGHT BREAST LUMPECTOMY WITH RADIOACTIVE SEED AND RIGHT SENTINEL LYMPH NODE BIOPSY ERAS PATHWAY;  Surgeon: Fanny Skates, MD;  Location: [facility];  Service: General;  Laterality: Right;  PEC BLOCK  . LAPAROSCOPIC SIGMOID COLECTOMY    . LAPAROSCOPY    . PORTACATH PLACEMENT Left 01/08/2017   Procedure: INSERTION PORT-A-CATH WITH ULTRA SOUND;  Surgeon: Fanny Skates, MD;  Location: [facility];  Service: General;  Laterality: Left;    FAMILY HISTORY Family History  Problem Relation Age of Onset  . Rheum arthritis  Mother   . Hyperlipidemia Mother   . Hypertension Mother   . Diverticulitis Father   . Parkinson's disease Father   . Breast cancer Neg Hx   Katie Marquez comes from an [place] background.  Her father died at 74 with Parkinson's disease and complications from tobacco abuse including a history of renal cancer;  the patient's mother died at age 17, with a history of rheumatoid arthritis and thyroid cancer.  The patient had no brothers, 1 sister, in good health.  There is no history of breast or ovarian cancer in the family  GYNECOLOGIC HISTORY:  No LMP recorded. Patient is postmenopausal. Menarche age 2, first live birth age 69.  The patient is GX P1.  She had infertility treatments after her first live birth but they were not successful.  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  Their daughter [name] lives in [place].  She attended [employer] and then the [employer] in business and psychology.  She is planning to get married in September 2019.  Her fiance is in finance.     ADVANCED DIRECTIVES:    HEALTH MAINTENANCE: Social History   Tobacco Use  . Smoking status: Never Smoker  . Smokeless tobacco: Never Used  Substance Use Topics  . Alcohol use: Yes    Frequency: Never    Comment: occas  . Drug use: No     Colonoscopy: [facility]  PAP: [facility]  Bone density: Osteopenia, remote test   No Known Allergies  Current Outpatient Medications  Medication Sig Dispense Refill  . CALCIUM PO Take by mouth daily.    . cholecalciferol (VITAMIN D) 1000 units tablet Take 2,000 Units by mouth daily.    Marland Kitchen HYDROcodone-acetaminophen (NORCO) 5-325 MG tablet Take 1-2 tablets by mouth every 6 (six) hours as needed for moderate pain or severe pain. 30 tablet 0  . lisinopril (PRINIVIL,ZESTRIL) 20 MG tablet Take 20 mg by mouth daily.    . Probiotic Product (PROBIOTIC-10 PO) Take by mouth.    . Red Yeast Rice 600 MG CAPS Take by mouth.    Marland Kitchen FLUoxetine (PROZAC) 10 MG capsule Take 20 mg by mouth daily.      No current facility-administered medications for this visit.     OBJECTIVE:   Vitals:   01/26/17 1053  BP:  (!) 169/106  Pulse: 76  Resp: 17  Temp: 98.5 F (36.9 C)  SpO2: 100%     Body mass index is 21.53 kg/m.   Wt Readings from Last 3 Encounters:  01/26/17 117 lb 11.2 oz (53.4 kg)  01/08/17 115 lb (52.2 kg)  01/02/17 114 lb (51.7 kg)      ECOG FS:0 - Asymptomatic GENERAL: Patient is a well appearing female in no acute distress HEENT:  Sclerae anicteric.  Oropharynx clear and moist. No ulcerations or evidence of oropharyngeal candidiasis. Neck is supple.  NODES:  No cervical, supraclavicular, or axillary lymphadenopathy palpated.  BREAST EXAM: right axilla is swollen, no erythema or warmth, ? Seroma, lumpectomy site is healing well.   LUNGS:  Clear to auscultation bilaterally.  No wheezes or rhonchi. HEART:  Regular rate and rhythm. No murmur appreciated. ABDOMEN:  Soft, nontender.  Positive, normoactive bowel sounds. No organomegaly palpated. MSK:  No focal spinal tenderness to palpation. Full range of motion bilaterally in the upper  extremities. EXTREMITIES:  No peripheral edema.   SKIN:  Clear with no obvious rashes or skin changes. No nail dyscrasia. NEURO:  Nonfocal. Well oriented.  Appropriate affect.    LAB RESULTS:  CMP     Component Value Date/Time   NA 137 01/03/2017 1511   K 4.6 01/03/2017 1511   CL 101 01/03/2017 1511   CO2 27 01/03/2017 1511   GLUCOSE 130 (H) 01/03/2017 1511   BUN 17 01/03/2017 1511   CREATININE 0.72 01/03/2017 1511   CALCIUM 9.4 01/03/2017 1511   PROT 6.8 01/03/2017 1511   ALBUMIN 3.9 01/03/2017 1511   AST 20 01/03/2017 1511   ALT 18 01/03/2017 1511   ALKPHOS 55 01/03/2017 1511   BILITOT 0.7 01/03/2017 1511   GFRNONAA >60 01/03/2017 1511   GFRAA >60 01/03/2017 1511    No results found for: TOTALPROTELP, ALBUMINELP, A1GS, A2GS, BETS, BETA2SER, GAMS, MSPIKE, SPEI  No results found for: Nils Pyle, [facility]  Lab Results  Component Value Date   WBC 7.8 01/03/2017   NEUTROABS 5.3 01/03/2017   HGB 14.2 01/03/2017   HCT  41.7 01/03/2017   MCV 85.3 01/03/2017   PLT 255 01/03/2017      Chemistry      Component Value Date/Time   NA 137 01/03/2017 1511   K 4.6 01/03/2017 1511   CL 101 01/03/2017 1511   CO2 27 01/03/2017 1511   BUN 17 01/03/2017 1511   CREATININE 0.72 01/03/2017 1511      Component Value Date/Time   CALCIUM 9.4 01/03/2017 1511   ALKPHOS 55 01/03/2017 1511   AST 20 01/03/2017 1511   ALT 18 01/03/2017 1511   BILITOT 0.7 01/03/2017 1511       No results found for: LABCA2  No components found for: BPZWCH852  No results for input(s): INR in the last 168 hours.  No results found for: LABCA2  No results found for: DPO242  No results found for: PNT614  No results found for: ERX540  No results found for: CA2729  No components found for: HGQUANT  No results found for: CEA1 / No results found for: CEA1   No results found for: AFPTUMOR  No results found for: CHROMOGRNA  No results found for: PSA1  No visits with results within 3 Day(s) from this visit.  Latest known visit with results is:  Admission on 01/08/2017, Discharged on 01/08/2017  Component Date Value Ref Range Status  . WBC 01/03/2017 7.8  4.0 - 10.5 K/uL Final  . RBC 01/03/2017 4.89  3.87 - 5.11 MIL/uL Final  . Hemoglobin 01/03/2017 14.2  12.0 - 15.0 g/dL Final  . HCT 01/03/2017 41.7  36.0 - 46.0 % Final  . MCV 01/03/2017 85.3  78.0 - 100.0 fL Final  . MCH 01/03/2017 29.0  26.0 - 34.0 pg Final  . MCHC 01/03/2017 34.1  30.0 - 36.0 g/dL Final  . RDW 01/03/2017 12.5  11.5 - 15.5 % Final  . Platelets 01/03/2017 255  150 - 400 K/uL Final  . Neutrophils Relative % 01/03/2017 68  % Final  . Neutro Abs 01/03/2017 5.3  1.7 - 7.7 K/uL Final  . Lymphocytes Relative 01/03/2017 23  % Final  . Lymphs Abs 01/03/2017 1.8  0.7 - 4.0 K/uL Final  . Monocytes Relative 01/03/2017 7  % Final  . Monocytes Absolute 01/03/2017 0.6  0.1 - 1.0 K/uL Final  . Eosinophils Relative 01/03/2017 2  % Final  . Eosinophils Absolute  01/03/2017 0.2  0.0 - 0.7  K/uL Final  . Basophils Relative 01/03/2017 0  % Final  . Basophils Absolute 01/03/2017 0.0  0.0 - 0.1 K/uL Final  . Sodium 01/03/2017 137  135 - 145 mmol/L Final  . Potassium 01/03/2017 4.6  3.5 - 5.1 mmol/L Final  . Chloride 01/03/2017 101  101 - 111 mmol/L Final  . CO2 01/03/2017 27  22 - 32 mmol/L Final  . Glucose, Bld 01/03/2017 130* 65 - 99 mg/dL Final  . BUN 01/03/2017 17  6 - 20 mg/dL Final  . Creatinine, Ser 01/03/2017 0.72  0.44 - 1.00 mg/dL Final  . Calcium 01/03/2017 9.4  8.9 - 10.3 mg/dL Final  . Total Protein 01/03/2017 6.8  6.5 - 8.1 g/dL Final  . Albumin 01/03/2017 3.9  3.5 - 5.0 g/dL Final  . AST 01/03/2017 20  15 - 41 U/L Final  . ALT 01/03/2017 18  14 - 54 U/L Final  . Alkaline Phosphatase 01/03/2017 55  38 - 126 U/L Final  . Total Bilirubin 01/03/2017 0.7  0.3 - 1.2 mg/dL Final  . GFR calc non Af Amer 01/03/2017 >60  >60 mL/min Final  . GFR calc Af Amer 01/03/2017 >60  >60 mL/min Final   Comment: (NOTE) The eGFR has been calculated using the CKD EPI equation. This calculation has not been validated in all clinical situations. eGFR's persistently <60 mL/min signify possible Chronic Kidney Disease.   . Anion gap 01/03/2017 9  5 - 15 Final    (this displays the last labs from the last 3 days)  No results found for: TOTALPROTELP, ALBUMINELP, A1GS, A2GS, BETS, BETA2SER, GAMS, MSPIKE, SPEI (this displays SPEP labs)  No results found for: KPAFRELGTCHN, LAMBDASER, KAPLAMBRATIO (kappa/lambda light chains)  No results found for: HGBA, HGBA2QUANT, HGBFQUANT, HGBSQUAN (Hemoglobinopathy evaluation)   No results found for: LDH  No results found for: IRON, TIBC, IRONPCTSAT (Iron and TIBC)  No results found for: FERRITIN  Urinalysis No results found for: COLORURINE, APPEARANCEUR, LABSPEC, PHURINE, GLUCOSEU, HGBUR, BILIRUBINUR, KETONESUR, PROTEINUR, UROBILINOGEN, NITRITE, LEUKOCYTESUR   STUDIES: Nm Sentinel Node Inj-no Rpt  (breast)  Result Date: 01/08/2017 Sulfur colloid was injected by the nuclear medicine technologist for melanoma sentinel node.   Mm Breast Surgical Specimen  Result Date: 01/08/2017 CLINICAL DATA:  Right breast cancer status post right lumpectomy. EXAM: SPECIMEN RADIOGRAPH OF THE RIGHT BREAST COMPARISON:  Previous exam(s). FINDINGS: Status post excision of the right breast. The radioactive seed and ribbon biopsy marker clip are present, completely intact, and were marked for pathology. IMPRESSION: Specimen radiograph of the right breast. Electronically Signed   By: Abelardo Diesel M.D.   On: 01/08/2017 12:36   Dg Chest Port 1 View  Result Date: 01/08/2017 CLINICAL DATA:  Port-A-Cath placement EXAM: PORTABLE CHEST 1 VIEW COMPARISON:  01/04/2005 chest radiograph. FINDINGS: Left subclavian Port-A-Cath terminates in the lower third of the superior vena cava. Stable cardiomediastinal silhouette with normal heart size. No pneumothorax. No pleural effusion. Lungs appear clear, with no acute consolidative airspace disease and no pulmonary edema. Surgical clips overlie the right axilla and right breast. IMPRESSION: 1. Left subclavian Port-A-Cath terminates in the lower third of the SVC. No pneumothorax. 2. No active cardiopulmonary disease. Electronically Signed   By: Ilona Sorrel M.D.   On: 01/08/2017 13:55   Dg Fluoro Guide Cv Line-no Report  Result Date: 01/08/2017 Fluoroscopy was utilized by the requesting physician.  No radiographic interpretation.   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 Radioactive seed localization is performed in anticipation of lumpectomy which is scheduled for 01/08/2017. EXAM: MAMMOGRAPHIC GUIDED RADIOACTIVE SEED LOCALIZATION OF THE RIGHT BREAST COMPARISON:  Previous exam(s). FINDINGS: Patient presents for  radioactive seed localization prior to right breast lumpectomy. I met with the patient and we discussed the procedure of seed localization including benefits and alternatives. We discussed the high likelihood of a successful procedure. We discussed the risks of the procedure including infection, bleeding, tissue injury and further surgery. We discussed the low dose of radioactivity involved in the procedure. Informed, written consent was given. The usual time-out protocol was performed immediately prior to the procedure. Using mammographic guidance, sterile technique with chlorhexidine as skin antisepsis, 1% lidocaine as local anesthesia, an I-125 radioactive seed was used to localize the ribbon shaped tissue marker clip associated with the biopsy-proven IDC and DCIS using a medial approach. The follow-up mammogram images confirm the seed in the expected location approximately 4 mm posterior to the clip. The images were marked for Dr. Dalbert Batman. Follow-up survey of the right breast confirms the presence of the radioactive seed. Order number of I-125 seed: [id number] Total activity: 0.249 mCi  Reference Date: 12/26/2016 The patient tolerated the procedure well and was released from the [facility]. She was given instructions regarding seed removal. 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 No apparent complications. Electronically Signed   By: Evangeline Dakin M.D.   On: 01/05/2017 12:30    ELIGIBLE FOR AVAILABLE RESEARCH PROTOCOL: no  ASSESSMENT: 61 y.o. [place] woman status post right breast lower outer quadrant biopsy 12/15/2016 for a clinical T1b N0, stage IA invasive ductal carcinoma, grade 3, triple positive, with an MIB-1 of 15%  (1) Lumpectomy on 01/08/2017 that demonstrated IDC, grade 2, 1.1cm, 4 SLN negative, T1c, N0, stage IA  (2) adjuvant chemotherapy will consist of paclitaxel weekly  x12, together with trastuzumab every 21 days to start on 01/31/2017  (a) Echocardiogram on 01/17/2017 demonstrates a LVEF of 65-70%  (3) the trastuzumab to be continued to total 1 year  (4) adjuvant radiation planned  (5) antiestrogens to follow at the completion of local treatment  (6) genetics testing pending  PLAN:  I reviewed Jonnae's upcoming chemotherapy with her today in detail.  I reviewed possible side effects.  I sent in prescriptions for her anti emetics.  I reviewed with her how to take her anti emetics.  I also reviewed with her the cardiotoxicity risk with Trastuzumab.  I reviewed her echo results with her in detail.  I will get her in with Dr. Haroldine Laws or Dr. Aundra Dubin in the cardio onc clinic.  I reviewed her role with them in detail.  She will f/u with Dr. Dalbert Batman on Monday.  She will return for chemotherapy on Wednesday.    Kinnedy has a good understanding of the overall plan. She agrees with it. She knows the goal of treatment in her case is cure. She will call with any problems that may develop before her next visit here.  A total of (30) minutes of face-to-face time was spent with this patient with greater than 50% of that time in counseling and care-coordination.   Scot Dock, NP   01/26/2017 11:00 AM Medical Oncology and Hematology [facility] [address] [place], [place] [address] Tel. [phone]    Fax.  [phone]

## 2017-01-29 ENCOUNTER — Other Ambulatory Visit: Payer: Self-pay

## 2017-01-29 DIAGNOSIS — C50511 Malignant neoplasm of lower-outer quadrant of right female breast: Secondary | ICD-10-CM

## 2017-01-29 DIAGNOSIS — Z170 Estrogen receptor positive status [ER+]: Principal | ICD-10-CM

## 2017-01-31 ENCOUNTER — Encounter: Payer: Self-pay | Admitting: *Deleted

## 2017-01-31 ENCOUNTER — Other Ambulatory Visit (HOSPITAL_BASED_OUTPATIENT_CLINIC_OR_DEPARTMENT_OTHER): Payer: 59

## 2017-01-31 ENCOUNTER — Ambulatory Visit (HOSPITAL_BASED_OUTPATIENT_CLINIC_OR_DEPARTMENT_OTHER): Payer: 59

## 2017-01-31 ENCOUNTER — Other Ambulatory Visit: Payer: Self-pay | Admitting: Oncology

## 2017-01-31 VITALS — BP 134/94 | HR 78 | Temp 98.9°F | Resp 16

## 2017-01-31 DIAGNOSIS — Z170 Estrogen receptor positive status [ER+]: Principal | ICD-10-CM

## 2017-01-31 DIAGNOSIS — Z5111 Encounter for antineoplastic chemotherapy: Secondary | ICD-10-CM

## 2017-01-31 DIAGNOSIS — C50511 Malignant neoplasm of lower-outer quadrant of right female breast: Secondary | ICD-10-CM

## 2017-01-31 DIAGNOSIS — Z5112 Encounter for antineoplastic immunotherapy: Secondary | ICD-10-CM

## 2017-01-31 LAB — COMPREHENSIVE METABOLIC PANEL
ALT: 29 U/L (ref 0–55)
AST: 18 U/L (ref 5–34)
Albumin: 4 g/dL (ref 3.5–5.0)
Alkaline Phosphatase: 66 U/L (ref 40–150)
Anion Gap: 8 mEq/L (ref 3–11)
BUN: 17.4 mg/dL (ref 7.0–26.0)
CALCIUM: 10 mg/dL (ref 8.4–10.4)
CHLORIDE: 103 meq/L (ref 98–109)
CO2: 29 meq/L (ref 22–29)
CREATININE: 0.8 mg/dL (ref 0.6–1.1)
EGFR: >60 mL/min/{1.73_m2} (ref >60)
GLUCOSE: 86 mg/dL (ref 70–140)
Potassium: 4.1 mEq/L (ref 3.5–5.1)
Sodium: 141 mEq/L (ref 136–145)
Total Bilirubin: 0.57 mg/dL (ref 0.20–1.20)
Total Protein: 7.2 g/dL (ref 6.4–8.3)

## 2017-01-31 LAB — CBC WITH DIFFERENTIAL/PLATELET
BASO%: 1.1 % (ref 0.0–2.0)
BASOS ABS: 0.1 10*3/uL (ref 0.0–0.1)
EOS ABS: 0.2 10*3/uL (ref 0.0–0.5)
EOS%: 3.1 % (ref 0.0–7.0)
HCT: 42.2 % (ref 34.8–46.6)
HGB: 14.4 g/dL (ref 11.6–15.9)
LYMPH%: 24.6 % (ref 14.0–49.7)
MCH: 29.1 pg (ref 25.1–34.0)
MCHC: 34.2 g/dL (ref 31.5–36.0)
MCV: 85.2 fL (ref 79.5–101.0)
MONO#: 0.6 10*3/uL (ref 0.1–0.9)
MONO%: 9.8 % (ref 0.0–14.0)
NEUT#: 3.9 10*3/uL (ref 1.5–6.5)
NEUT%: 61.4 % (ref 38.4–76.8)
PLATELETS: 304 10*3/uL (ref 145–400)
RBC: 4.95 10*6/uL (ref 3.70–5.45)
RDW: 13.3 % (ref 11.2–14.5)
WBC: 6.3 10*3/uL (ref 3.9–10.3)
lymph#: 1.5 10*3/uL (ref 0.9–3.3)

## 2017-01-31 LAB — DRAW EXTRA CLOT TUBE

## 2017-01-31 MED ORDER — ACETAMINOPHEN 325 MG PO TABS
650.0000 mg | ORAL_TABLET | Freq: Once | ORAL | Status: AC
  Administered 2017-01-31: 650 mg via ORAL

## 2017-01-31 MED ORDER — TRASTUZUMAB CHEMO 150 MG IV SOLR
8.0000 mg/kg | Freq: Once | INTRAVENOUS | Status: AC
  Administered 2017-01-31: 420 mg via INTRAVENOUS
  Filled 2017-01-31: qty 20

## 2017-01-31 MED ORDER — FAMOTIDINE IN NACL 20-0.9 MG/50ML-% IV SOLN
INTRAVENOUS | Status: AC
  Filled 2017-01-31: qty 50

## 2017-01-31 MED ORDER — CLONIDINE HCL 0.1 MG PO TABS
0.1000 mg | ORAL_TABLET | Freq: Once | ORAL | Status: AC
  Administered 2017-01-31: 0.1 mg via ORAL

## 2017-01-31 MED ORDER — SODIUM CHLORIDE 0.9 % IV SOLN
20.0000 mg | Freq: Once | INTRAVENOUS | Status: AC
  Administered 2017-01-31: 20 mg via INTRAVENOUS
  Filled 2017-01-31: qty 2

## 2017-01-31 MED ORDER — DIPHENHYDRAMINE HCL 50 MG/ML IJ SOLN
INTRAMUSCULAR | Status: AC
  Filled 2017-01-31: qty 1

## 2017-01-31 MED ORDER — DIPHENHYDRAMINE HCL 25 MG PO CAPS: 25.0000 mg | ORAL_CAPSULE | Freq: Once | ORAL | Status: DC

## 2017-01-31 MED ORDER — SODIUM CHLORIDE 0.9 % IV SOLN
Freq: Once | INTRAVENOUS | Status: AC
  Administered 2017-01-31: 14:00:00 via INTRAVENOUS

## 2017-01-31 MED ORDER — CLONIDINE HCL 0.1 MG PO TABS
ORAL_TABLET | ORAL | Status: AC
  Filled 2017-01-31: qty 1

## 2017-01-31 MED ORDER — SODIUM CHLORIDE 0.9 % IV SOLN
80.0000 mg/m2 | Freq: Once | INTRAVENOUS | Status: AC
  Administered 2017-01-31: 120 mg via INTRAVENOUS
  Filled 2017-01-31: qty 20

## 2017-01-31 MED ORDER — HEPARIN SOD (PORK) LOCK FLUSH 100 UNIT/ML IV SOLN
500.0000 [IU] | Freq: Once | INTRAVENOUS | Status: AC | PRN
  Administered 2017-01-31: 500 [IU]
  Filled 2017-01-31: qty 5

## 2017-01-31 MED ORDER — DIPHENHYDRAMINE HCL 50 MG/ML IJ SOLN
25.0000 mg | Freq: Once | INTRAMUSCULAR | Status: AC
  Administered 2017-01-31: 25 mg via INTRAVENOUS

## 2017-01-31 MED ORDER — ACETAMINOPHEN 325 MG PO TABS
ORAL_TABLET | ORAL | Status: AC
  Filled 2017-01-31: qty 2

## 2017-01-31 MED ORDER — FAMOTIDINE IN NACL 20-0.9 MG/50ML-% IV SOLN
20.0000 mg | Freq: Once | INTRAVENOUS | Status: AC
  Administered 2017-01-31: 20 mg via INTRAVENOUS

## 2017-01-31 MED ORDER — SODIUM CHLORIDE 0.9% FLUSH
10.0000 mL | INTRAVENOUS | Status: DC | PRN
  Administered 2017-01-31: 10 mL
  Filled 2017-01-31: qty 10

## 2017-01-31 NOTE — Patient Instructions (Addendum)
[facility] Discharge Instructions for Patients Receiving Chemotherapy  Today you received the following chemotherapy agents Herceptin and Taxol.  To help prevent nausea and vomiting after your treatment, we encourage you to take your nausea medication as directed.   If you develop nausea and vomiting that is not controlled by your nausea medication, call the clinic.   BELOW ARE SYMPTOMS THAT SHOULD BE REPORTED IMMEDIATELY:  *FEVER GREATER THAN 100.5 F  *CHILLS WITH OR WITHOUT FEVER  NAUSEA AND VOMITING THAT IS NOT CONTROLLED WITH YOUR NAUSEA MEDICATION  *UNUSUAL SHORTNESS OF BREATH  *UNUSUAL BRUISING OR BLEEDING  TENDERNESS IN MOUTH AND THROAT WITH OR WITHOUT PRESENCE OF ULCERS  *URINARY PROBLEMS  *BOWEL PROBLEMS  UNUSUAL RASH Items with * indicate a potential emergency and should be followed up as soon as possible.  Feel free to call the clinic should you have any questions or concerns. The clinic phone number is [phone].  Please show the [facility] at check-in to the Emergency Department and triage nurse.   Paclitaxel injection What is this medicine? PACLITAXEL (PAK li TAX el) is a chemotherapy drug. It targets fast dividing cells, like cancer cells, and causes these cells to die. This medicine is used to treat ovarian cancer, breast cancer, and other cancers. This medicine may be used for other purposes; ask your health care provider or pharmacist if you have questions. COMMON BRAND NAME(S): Onxol, Taxol What should I tell my health care provider before I take this medicine? They need to know if you have any of these conditions: -blood disorders -irregular heartbeat -infection (especially a virus infection such as chickenpox, cold sores, or herpes) -liver disease -previous or ongoing radiation therapy -an unusual or allergic reaction to paclitaxel, alcohol, polyoxyethylated castor oil, other chemotherapy agents, other medicines, foods,  dyes, or preservatives -pregnant or trying to get pregnant -breast-feeding How should I use this medicine? This drug is given as an infusion into a vein. It is administered in a hospital or clinic by a specially trained health care professional. Talk to your pediatrician regarding the use of this medicine in children. Special care may be needed. Overdosage: If you think you have taken too much of this medicine contact a poison control center or emergency room at once. NOTE: This medicine is only for you. Do not share this medicine with others. What if I miss a dose? It is important not to miss your dose. Call your doctor or health care professional if you are unable to keep an appointment. What may interact with this medicine? Do not take this medicine with any of the following medications: -disulfiram -metronidazole This medicine may also interact with the following medications: -cyclosporine -diazepam -ketoconazole -medicines to increase blood counts like filgrastim, pegfilgrastim, sargramostim -other chemotherapy drugs like cisplatin, doxorubicin, epirubicin, etoposide, teniposide, vincristine -quinidine -testosterone -vaccines -verapamil Talk to your doctor or health care professional before taking any of these medicines: -acetaminophen -aspirin -ibuprofen -ketoprofen -naproxen This list may not describe all possible interactions. Give your health care provider a list of all the medicines, herbs, non-prescription drugs, or dietary supplements you use. Also tell them if you smoke, drink alcohol, or use illegal drugs. Some items may interact with your medicine. What should I watch for while using this medicine? Your condition will be monitored carefully while you are receiving this medicine. You will need important blood work done while you are taking this medicine. This medicine can cause serious allergic reactions. To reduce your risk you will  will need to take other medicine(s)  before treatment with this medicine. If you experience allergic reactions like skin rash, itching or hives, swelling of the face, lips, or tongue, tell your doctor or health care professional right away. In some cases, you may be given additional medicines to help with side effects. Follow all directions for their use. This drug may make you feel generally unwell. This is not uncommon, as chemotherapy can affect healthy cells as well as cancer cells. Report any side effects. Continue your course of treatment even though you feel ill unless your doctor tells you to stop. Call your doctor or health care professional for advice if you get a fever, chills or sore throat, or other symptoms of a cold or flu. Do not treat yourself. This drug decreases your body's ability to fight infections. Try to avoid being around people who are sick. This medicine may increase your risk to bruise or bleed. Call your doctor or health care professional if you notice any unusual bleeding. Be careful brushing and flossing your teeth or using a toothpick because you may get an infection or bleed more easily. If you have any dental work done, tell your dentist you are receiving this medicine. Avoid taking products that contain aspirin, acetaminophen, ibuprofen, naproxen, or ketoprofen unless instructed by your doctor. These medicines may hide a fever. Do not become pregnant while taking this medicine. Women should inform their doctor if they wish to become pregnant or think they might be pregnant. There is a potential for serious side effects to an unborn child. Talk to your health care professional or pharmacist for more information. Do not breast-feed an infant while taking this medicine. Men are advised not to father a child while receiving this medicine. This product may contain alcohol. Ask your pharmacist or healthcare provider if this medicine contains alcohol. Be sure to tell all healthcare providers you are taking this  medicine. Certain medicines, like metronidazole and disulfiram, can cause an unpleasant reaction when taken with alcohol. The reaction includes flushing, headache, nausea, vomiting, sweating, and increased thirst. The reaction can last from 30 minutes to several hours. What side effects may I notice from receiving this medicine? Side effects that you should report to your doctor or health care professional as soon as possible: -allergic reactions like skin rash, itching or hives, swelling of the face, lips, or tongue -low blood counts - This drug may decrease the number of white blood cells, red blood cells and platelets. You may be at increased risk for infections and bleeding. -signs of infection - fever or chills, cough, sore throat, pain or difficulty passing urine -signs of decreased platelets or bleeding - bruising, pinpoint red spots on the skin, black, tarry stools, nosebleeds -signs of decreased red blood cells - unusually weak or tired, fainting spells, lightheadedness -breathing problems -chest pain -high or low blood pressure -mouth sores -nausea and vomiting -pain, swelling, redness or irritation at the injection site -pain, tingling, numbness in the hands or feet -slow or irregular heartbeat -swelling of the ankle, feet, hands Side effects that usually do not require medical attention (report to your doctor or health care professional if they continue or are bothersome): -bone pain -complete hair loss including hair on your head, underarms, pubic hair, eyebrows, and eyelashes -changes in the color of fingernails -diarrhea -loosening of the fingernails -loss of appetite -muscle or joint pain -red flush to skin -sweating This list may not describe all possible side effects. Call your doctor for   advice about side effects. You may report side effects to FDA at 1-800-FDA-1088. Where should I keep my medicine? This drug is given in a hospital or clinic and will not be  stored at home. NOTE: This sheet is a summary. It may not cover all possible information. If you have questions about this medicine, talk to your doctor, pharmacist, or health care provider.  2018 [employer] (2014-11-17 19:58:00)  Trastuzumab injection for infusion What is this medicine? TRASTUZUMAB (tras TOO zoo mab) is a monoclonal antibody. It is used to treat breast cancer and stomach cancer. This medicine may be used for other purposes; ask your health care provider or pharmacist if you have questions. COMMON BRAND NAME(S): Herceptin What should I tell my health care provider before I take this medicine? They need to know if you have any of these conditions: -heart disease -heart failure -lung or breathing disease, like asthma -an unusual or allergic reaction to trastuzumab, benzyl alcohol, or other medications, foods, dyes, or preservatives -pregnant or trying to get pregnant -breast-feeding How should I use this medicine? This drug is given as an infusion into a vein. It is administered in a hospital or clinic by a specially trained health care professional. Talk to your pediatrician regarding the use of this medicine in children. This medicine is not approved for use in children. Overdosage: If you think you have taken too much of this medicine contact a poison control center or emergency room at once. NOTE: This medicine is only for you. Do not share this medicine with others. What if I miss a dose? It is important not to miss a dose. Call your doctor or health care professional if you are unable to keep an appointment. What may interact with this medicine? This medicine may interact with the following medications: -certain types of chemotherapy, such as daunorubicin, doxorubicin, epirubicin, and idarubicin This list may not describe all possible interactions. Give your health care provider a list of all the medicines, herbs, non-prescription drugs, or dietary  supplements you use. Also tell them if you smoke, drink alcohol, or use illegal drugs. Some items may interact with your medicine. What should I watch for while using this medicine? Visit your doctor for checks on your progress. Report any side effects. Continue your course of treatment even though you feel ill unless your doctor tells you to stop. Call your doctor or health care professional for advice if you get a fever, chills or sore throat, or other symptoms of a cold or flu. Do not treat yourself. Try to avoid being around people who are sick. You may experience fever, chills and shaking during your first infusion. These effects are usually mild and can be treated with other medicines. Report any side effects during the infusion to your health care professional. Fever and chills usually do not happen with later infusions. Do not become pregnant while taking this medicine or for 7 months after stopping it. Women should inform their doctor if they wish to become pregnant or think they might be pregnant. Women of child-bearing potential will need to have a negative pregnancy test before starting this medicine. There is a potential for serious side effects to an unborn child. Talk to your health care professional or pharmacist for more information. Do not breast-feed an infant while taking this medicine or for 7 months after stopping it. Women must use effective birth control with this medicine. What side effects may I notice from receiving this medicine? Side effects that you  should report to your doctor or health care professional as soon as possible: -allergic reactions like skin rash, itching or hives, swelling of the face, lips, or tongue -chest pain or palpitations -cough -dizziness -feeling faint or lightheaded, falls -fever -general ill feeling or flu-like symptoms -signs of worsening heart failure like breathing problems; swelling in your legs and feet -unusually weak or tired Side  effects that usually do not require medical attention (report to your doctor or health care professional if they continue or are bothersome): -bone pain -changes in taste -diarrhea -joint pain -nausea/vomiting -weight loss This list may not describe all possible side effects. Call your doctor for medical advice about side effects. You may report side effects to FDA at 1-800-FDA-1088. Where should I keep my medicine? This drug is given in a hospital or clinic and will not be stored at home. NOTE: This sheet is a summary. It may not cover all possible information. If you have questions about this medicine, talk to your doctor, pharmacist, or health care provider.  2018 [employer] Standard (2016-01-11 14:37:52)

## 2017-02-01 ENCOUNTER — Telehealth (HOSPITAL_COMMUNITY): Payer: Self-pay | Admitting: Vascular Surgery

## 2017-02-01 NOTE — Telephone Encounter (Signed)
Left pt message to make new brst appt w/ echo in March

## 2017-02-05 ENCOUNTER — Telehealth: Payer: Self-pay | Admitting: *Deleted

## 2017-02-06 ENCOUNTER — Other Ambulatory Visit: Payer: Self-pay | Admitting: *Deleted

## 2017-02-06 ENCOUNTER — Inpatient Hospital Stay (HOSPITAL_BASED_OUTPATIENT_CLINIC_OR_DEPARTMENT_OTHER): Payer: 59 | Admitting: Adult Health

## 2017-02-06 ENCOUNTER — Telehealth: Payer: Self-pay | Admitting: Adult Health

## 2017-02-06 ENCOUNTER — Inpatient Hospital Stay: Payer: 59 | Attending: Adult Health

## 2017-02-06 ENCOUNTER — Encounter: Payer: Self-pay | Admitting: Adult Health

## 2017-02-06 ENCOUNTER — Inpatient Hospital Stay: Payer: 59

## 2017-02-06 VITALS — BP 139/93 | HR 81 | Temp 99.6°F | Resp 16 | Ht 62.0 in | Wt 114.3 lb

## 2017-02-06 DIAGNOSIS — Z5111 Encounter for antineoplastic chemotherapy: Secondary | ICD-10-CM | POA: Insufficient documentation

## 2017-02-06 DIAGNOSIS — C50511 Malignant neoplasm of lower-outer quadrant of right female breast: Secondary | ICD-10-CM | POA: Diagnosis not present

## 2017-02-06 DIAGNOSIS — Z808 Family history of malignant neoplasm of other organs or systems: Secondary | ICD-10-CM | POA: Insufficient documentation

## 2017-02-06 DIAGNOSIS — K5900 Constipation, unspecified: Secondary | ICD-10-CM | POA: Insufficient documentation

## 2017-02-06 DIAGNOSIS — Z79899 Other long term (current) drug therapy: Secondary | ICD-10-CM | POA: Diagnosis not present

## 2017-02-06 DIAGNOSIS — Z170 Estrogen receptor positive status [ER+]: Principal | ICD-10-CM

## 2017-02-06 DIAGNOSIS — I10 Essential (primary) hypertension: Secondary | ICD-10-CM | POA: Insufficient documentation

## 2017-02-06 DIAGNOSIS — R251 Tremor, unspecified: Secondary | ICD-10-CM | POA: Insufficient documentation

## 2017-02-06 DIAGNOSIS — F419 Anxiety disorder, unspecified: Secondary | ICD-10-CM | POA: Diagnosis not present

## 2017-02-06 DIAGNOSIS — K589 Irritable bowel syndrome without diarrhea: Secondary | ICD-10-CM | POA: Insufficient documentation

## 2017-02-06 DIAGNOSIS — R011 Cardiac murmur, unspecified: Secondary | ICD-10-CM | POA: Insufficient documentation

## 2017-02-06 DIAGNOSIS — M1990 Unspecified osteoarthritis, unspecified site: Secondary | ICD-10-CM | POA: Diagnosis not present

## 2017-02-06 LAB — CBC WITH DIFFERENTIAL (CANCER CENTER ONLY)
ABS GRANULOCYTE: 4.2 10*3/uL (ref 1.5–6.5)
Basophils Absolute: 0.1 10*3/uL (ref 0.0–0.1)
Basophils Relative: 1 %
Eosinophils Absolute: 0.1 10*3/uL (ref 0.0–0.5)
Eosinophils Relative: 2 %
HEMATOCRIT: 41.3 % (ref 34.8–46.6)
HEMOGLOBIN: 14.2 g/dL (ref 11.6–15.9)
LYMPHS ABS: 0.9 10*3/uL (ref 0.9–3.3)
LYMPHS PCT: 16 %
MCH: 29 pg (ref 25.1–34.0)
MCHC: 34.4 g/dL (ref 31.5–36.0)
MCV: 84.4 fL (ref 79.5–101.0)
MONOS PCT: 9 %
Monocytes Absolute: 0.5 10*3/uL (ref 0.1–0.9)
NEUTROS ABS: 4.2 10*3/uL (ref 1.5–6.5)
Neutrophils Relative %: 72 %
Platelet Count: 277 10*3/uL (ref 145–400)
RBC: 4.89 MIL/uL (ref 3.70–5.45)
RDW: 13 % (ref 11.2–16.1)
WBC Count: 5.9 10*3/uL (ref 4.0–10.3)

## 2017-02-06 LAB — CMP (CANCER CENTER ONLY)
ALBUMIN: 3.8 g/dL (ref 3.5–5.0)
ALT: 34 U/L (ref 0–55)
ANION GAP: 7 (ref 3–11)
AST: 24 U/L (ref 5–34)
Alkaline Phosphatase: 63 U/L (ref 40–150)
BUN: 18 mg/dL (ref 7–26)
CHLORIDE: 102 mmol/L (ref 98–109)
CO2: 28 mmol/L (ref 22–29)
Calcium: 10 mg/dL (ref 8.4–10.4)
Creatinine: 0.91 mg/dL (ref 0.70–1.30)
GFR, Est AFR Am: >60 mL/min (ref >60)
GFR, Estimated: >60 mL/min (ref >60)
GLUCOSE: 107 mg/dL (ref 70–140)
POTASSIUM: 4.3 mmol/L (ref 3.3–4.7)
SODIUM: 137 mmol/L (ref 136–145)
Total Bilirubin: 0.6 mg/dL (ref 0.2–1.2)
Total Protein: 7.2 g/dL (ref 6.4–8.3)

## 2017-02-06 MED ORDER — HEPARIN SOD (PORK) LOCK FLUSH 100 UNIT/ML IV SOLN
500.0000 [IU] | Freq: Once | INTRAVENOUS | Status: AC | PRN
  Administered 2017-02-06: 500 [IU]
  Filled 2017-02-06: qty 5

## 2017-02-06 MED ORDER — SODIUM CHLORIDE 0.9 % IV SOLN: 10.0000 mg | Freq: Once | INTRAVENOUS | Status: DC

## 2017-02-06 MED ORDER — SODIUM CHLORIDE 0.9 % IV SOLN
Freq: Once | INTRAVENOUS | Status: AC
Start: 2017-02-06 — End: 2017-02-06
  Administered 2017-02-06: 13:00:00 via INTRAVENOUS

## 2017-02-06 MED ORDER — DEXAMETHASONE SODIUM PHOSPHATE 10 MG/ML IJ SOLN
10.0000 mg | Freq: Once | INTRAMUSCULAR | Status: AC
  Administered 2017-02-06: 10 mg via INTRAVENOUS

## 2017-02-06 MED ORDER — FAMOTIDINE IN NACL 20-0.9 MG/50ML-% IV SOLN
20.0000 mg | Freq: Once | INTRAVENOUS | Status: AC
  Administered 2017-02-06: 20 mg via INTRAVENOUS

## 2017-02-06 MED ORDER — SODIUM CHLORIDE 0.9 % IV SOLN
80.0000 mg/m2 | Freq: Once | INTRAVENOUS | Status: AC
  Administered 2017-02-06: 120 mg via INTRAVENOUS
  Filled 2017-02-06: qty 20

## 2017-02-06 MED ORDER — DIPHENHYDRAMINE HCL 50 MG/ML IJ SOLN
25.0000 mg | Freq: Once | INTRAMUSCULAR | Status: AC
  Administered 2017-02-06: 25 mg via INTRAVENOUS

## 2017-02-06 MED ORDER — SODIUM CHLORIDE 0.9% FLUSH
10.0000 mL | INTRAVENOUS | Status: DC | PRN
  Administered 2017-02-06: 10 mL
  Filled 2017-02-06: qty 10

## 2017-02-06 NOTE — Telephone Encounter (Signed)
Gave patient AVS and calendar of upcoming January appointments °

## 2017-02-06 NOTE — Progress Notes (Signed)
[place]  [phone] [phone]     ID: Katie Marquez DOB: 11-16-1955  MR#: [id number]  HAL#:[id number]  Patient Care Team: Jonathon Jordan, MD as PCP - General (Family Medicine) Fanny Skates, MD as Consulting Physician (General Surgery) Magrinat, Virgie Dad, MD as Consulting Physician (Oncology) Richmond Campbell, MD as Consulting Physician (Gastroenterology) Eppie Gibson, MD as Attending Physician (Radiation Oncology) Lovey Newcomer, MD as Attending Physician (Radiology) Bensimhon, Shaune Pascal, MD as Consulting Physician (Cardiology) Molli Posey, MD as Consulting Physician (Obstetrics and Gynecology) Scot Dock, NP OTHER MD:  CHIEF COMPLAINT: Triple positive breast cancer  CURRENT TREATMENT: Paclitaxel and Trastuzumab   HISTORY OF CURRENT ILLNESS: Katie Marquez had bilateral screening mammography with tomography at the breast center 12/12/2016, showing a possible mass in the right breast.  Right breast diagnostic mammography with tomography and right breast ultrasonography 12/15/2016 found the breast density to be category D.  In the lower right breast there was a small lobulated mass which was not palpable.  Ultrasound showed a 0.8 cm mass in the right breast 7:00 radiant 2 cm from the nipple.  The right axilla was benign.  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  The patient's subsequent history is as detailed below.  INTERVAL HISTORY: Katie Marquez is here today to discuss her upcoming treatment.  She is receiving weekly Paclitaxel and every three week Trastuzumab as adjuvant chemotherapy for her estrogen positive breast cancer.  She started this last week.  Today  is her second week of Pacliatxel.  REVIEW OF SYSTEMS: Katie Marquez tolerated her first treatment with Paclitaxel well.  She wants to convey to [place] her bowel issues however.  She has PMH of extensive diverticulosis previously requiring partial colectomy.  She sees Dr. Earlean Shawl for this and has Augmentin on hand if she feels like she is getting a flare of diverticulitis.  She notes that she also has irritable bowel syndrome.  Right now she has some left lower quadrant discomfort.  She notes that she was slightly constipated after receiving chemotherapy.  She has been taking a stool softener and miralax for the past couple of days.  She does not feel it is a diverticulitis flare.  She denies fevers, chills, nausea, vomiting, mucositis, chest pain, palpitations, or peripheral neuropathy.  Other than the bowel issue a detailed ROS is non contributory.    PAST MEDICAL HISTORY: Past Medical History:  Diagnosis Date  . Anxiety   . Cancer [facility])    breast cancer  . Diverticulosis   . Heart murmur   . Hypertension   . IBS (irritable bowel syndrome)   . Iron deficiency   . Osteoarthritis   . PONV (postoperative nausea and vomiting)   . Tremors of nervous system    essential tremors    PAST SURGICAL HISTORY: Past Surgical History:  Procedure Laterality Date  . BREAST LUMPECTOMY WITH RADIOACTIVE SEED AND SENTINEL LYMPH NODE BIOPSY Right 01/08/2017   Procedure: RIGHT BREAST LUMPECTOMY WITH RADIOACTIVE SEED AND RIGHT SENTINEL LYMPH NODE BIOPSY ERAS PATHWAY;  Surgeon: Fanny Skates, MD;  Location: [facility];  Service: General;  Laterality: Right;  PEC BLOCK  . LAPAROSCOPIC SIGMOID COLECTOMY    . LAPAROSCOPY    . PORTACATH PLACEMENT Left 01/08/2017  Procedure: INSERTION PORT-A-CATH WITH ULTRA SOUND;  Surgeon: Fanny Skates, MD;  [facility];  Service: General;  Laterality: Left;    FAMILY HISTORY Family History  Problem Relation Age of Onset  . Rheum arthritis Mother   . Hyperlipidemia Mother   .  Hypertension Mother   . Diverticulitis Father   . Parkinson's disease Father   . Breast cancer Neg Hx   Katie Marquez comes from an [place] background.  Her father died at 38 with Parkinson's disease and complications from tobacco abuse including a history of renal cancer; the patient's mother died at age 84, with a history of rheumatoid arthritis and thyroid cancer.  The patient had no brothers, 1 sister, in good health.  There is no history of breast or ovarian cancer in the family  GYNECOLOGIC HISTORY:  No LMP recorded. Patient is postmenopausal. Menarche age 17, first live birth age 22.  The patient is GX P1.  She had infertility treatments after her first live birth but they were not successful.  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  Their daughter [name] lives in [place].  She attended [employer] and then [employer] in business and psychology.  She is planning to get married in September 2019.  Her fiance is in finance.     ADVANCED DIRECTIVES:    HEALTH MAINTENANCE: Social History   Tobacco Use  . Smoking status: Never Smoker  . Smokeless tobacco: Never Used  Substance Use Topics  . Alcohol use: Yes    Frequency: Never    Comment: occas  . Drug use: No     Colonoscopy: [facility]  PAP: [place]  Bone density: Osteopenia, remote test   No Known Allergies  Current Outpatient Medications  Medication Sig Dispense Refill  . CALCIUM PO Take by mouth daily.    . cholecalciferol (VITAMIN D) 1000 units tablet Take 2,000 Units by mouth daily.    Marland Kitchen FLUoxetine (PROZAC) 10 MG capsule Take 20 mg by mouth daily.     Marland Kitchen lidocaine-prilocaine (EMLA) cream Apply to affected area once 30 g 3  . lisinopril (PRINIVIL,ZESTRIL) 20 MG tablet Take 20 mg by mouth daily.    Marland Kitchen LORazepam (ATIVAN) 0.5 MG  tablet Take 1 tablet (0.5 mg total) by mouth every 6 (six) hours as needed (Nausea or vomiting). 30 tablet 0  . ondansetron (ZOFRAN) 8 MG tablet Take 1 tablet (8 mg total) by mouth 2 (two) times daily as needed (Nausea or vomiting). 30 tablet 1  . Probiotic Product (PROBIOTIC-10 PO) Take by mouth.    . prochlorperazine (COMPAZINE) 10 MG tablet Take 1 tablet (10 mg total) by mouth every 6 (six) hours as needed (Nausea or vomiting). 30 tablet 1  . Red Yeast Rice 600 MG CAPS Take by mouth.     No current facility-administered medications for this visit.    Facility-Administered Medications Ordered in Other Visits  Medication Dose Route Frequency Provider Last Rate Last Dose  . famotidine (PEPCID) IVPB 20 mg premix  20 mg Intravenous Once Magrinat, Virgie Dad, MD      . PACLitaxel (TAXOL) 120 mg in sodium chloride 0.9 % 250 mL chemo infusion (</= '80mg'$ /m2)  80 mg/m2 (Treatment Plan Recorded) Intravenous Once Magrinat, Virgie Dad, MD      . sodium chloride flush (NS) 0.9 %  injection 10 mL  10 mL Intracatheter PRN Magrinat, Virgie Dad, MD        OBJECTIVE:   Vitals:   02/06/17 1113  BP: (!) 139/93  Pulse: 81  Resp: 16  Temp: 99.6 F (37.6 C)  SpO2: 99%     Body mass index is 20.91 kg/m.   Wt Readings from Last 3 Encounters:  02/06/17 114 lb 4.8 oz (51.8 kg)  01/26/17 117 lb 11.2 oz (53.4 kg)  01/08/17 115 lb (52.2 kg)      ECOG FS:0 - Asymptomatic GENERAL: Patient is a well appearing female in no acute distress HEENT:  Sclerae anicteric.  Oropharynx clear and moist. No ulcerations or evidence of oropharyngeal candidiasis. Neck is supple.  NODES:  No cervical, supraclavicular, or axillary lymphadenopathy palpated.  BREAST EXAM: right axilla is swollen, no erythema or warmth, ? Seroma, lumpectomy site is healing well.   LUNGS:  Clear to auscultation bilaterally.  No wheezes or rhonchi. HEART:  Regular rate and rhythm. No murmur appreciated. ABDOMEN:  Soft, nontender.  Positive, normoactive  bowel sounds. No organomegaly palpated. MSK:  No focal spinal tenderness to palpation. Full range of motion bilaterally in the upper extremities. EXTREMITIES:  No peripheral edema.   SKIN:  Clear with no obvious rashes or skin changes. No nail dyscrasia. NEURO:  Nonfocal. Well oriented.  Appropriate affect.    LAB RESULTS:  CMP     Component Value Date/Time   NA 137 02/06/2017 1040   NA 141 01/31/2017 1225   K 4.3 02/06/2017 1040   K 4.1 01/31/2017 1225   CL 102 02/06/2017 1040   CO2 28 02/06/2017 1040   CO2 29 01/31/2017 1225   GLUCOSE 107 02/06/2017 1040   GLUCOSE 86 01/31/2017 1225   BUN 18 02/06/2017 1040   BUN 17.4 01/31/2017 1225   CREATININE 0.8 01/31/2017 1225   CALCIUM 10.0 02/06/2017 1040   CALCIUM 10.0 01/31/2017 1225   PROT 7.2 02/06/2017 1040   PROT 7.2 01/31/2017 1225   ALBUMIN 3.8 02/06/2017 1040   ALBUMIN 4.0 01/31/2017 1225   AST 24 02/06/2017 1040   AST 18 01/31/2017 1225   ALT 34 02/06/2017 1040   ALT 29 01/31/2017 1225   ALKPHOS 63 02/06/2017 1040   ALKPHOS 66 01/31/2017 1225   BILITOT 0.6 02/06/2017 1040   BILITOT 0.57 01/31/2017 1225   GFRNONAA >60 01/03/2017 1511   GFRAA >60 01/03/2017 1511    No results found for: TOTALPROTELP, ALBUMINELP, A1GS, A2GS, BETS, BETA2SER, GAMS, MSPIKE, SPEI  No results found for: Nils Pyle, [facility]  Lab Results  Component Value Date   WBC 6.3 01/31/2017   NEUTROABS 4.2 02/06/2017   HGB 14.4 01/31/2017   HCT 41.3 02/06/2017   MCV 84.4 02/06/2017   PLT 304 01/31/2017      Chemistry      Component Value Date/Time   NA 137 02/06/2017 1040   NA 141 01/31/2017 1225   K 4.3 02/06/2017 1040   K 4.1 01/31/2017 1225   CL 102 02/06/2017 1040   CO2 28 02/06/2017 1040   CO2 29 01/31/2017 1225   BUN 18 02/06/2017 1040   BUN 17.4 01/31/2017 1225   CREATININE 0.8 01/31/2017 1225      Component Value Date/Time   CALCIUM 10.0 02/06/2017 1040   CALCIUM 10.0 01/31/2017 1225   ALKPHOS 63  02/06/2017 1040   ALKPHOS 66 01/31/2017 1225   AST 24 02/06/2017 1040   AST 18 01/31/2017 1225   ALT 34 02/06/2017  1040   ALT 29 01/31/2017 1225   BILITOT 0.6 02/06/2017 1040   BILITOT 0.57 01/31/2017 1225       No results found for: LABCA2  No components found for: [id number]  No results for input(s): INR in the last 168 hours.  No results found for: LABCA2  No results found for: OJJ009  No results found for: FGH829  No results found for: HBZ169  No results found for: CA2729  No components found for: HGQUANT  No results found for: CEA1 / No results found for: CEA1   No results found for: AFPTUMOR  No results found for: [place]  No results found for: PSA1  Appointment on 02/06/2017  Component Date Value Ref Range Status  . Sodium 02/06/2017 137  136 - 145 mmol/L Final  . Potassium 02/06/2017 4.3  3.3 - 4.7 mmol/L Final  . Chloride 02/06/2017 102  98 - 109 mmol/L Final  . CO2 02/06/2017 28  22 - 29 mmol/L Final  . Glucose, Bld 02/06/2017 107  70 - 140 mg/dL Final  . BUN 02/06/2017 18  7 - 26 mg/dL Final  . Creatinine 02/06/2017 0.91  0.70 - 1.30 mg/dL Final  . Calcium 02/06/2017 10.0  8.4 - 10.4 mg/dL Final  . Total Protein 02/06/2017 7.2  6.4 - 8.3 g/dL Final  . Albumin 02/06/2017 3.8  3.5 - 5.0 g/dL Final  . AST 02/06/2017 24  5 - 34 U/L Final  . ALT 02/06/2017 34  0 - 55 U/L Final  . Alkaline Phosphatase 02/06/2017 63  40 - 150 U/L Final  . Total Bilirubin 02/06/2017 0.6  0.2 - 1.2 mg/dL Final  . GFR, Est Non Af Am 02/06/2017 >60  >60 mL/min Final  . GFR, Est AFR Am 02/06/2017 >60  >60 mL/min Final   Comment: (NOTE) The eGFR has been calculated using the CKD EPI equation. This calculation has not been validated in all clinical situations. eGFR's persistently <60 mL/min signify possible Chronic Kidney Disease.   Georgiann Hahn gap 02/06/2017 7  3 - 11 Final   Performed at [facility], [place] [address] [place], [place] [address]  .  WBC Count 02/06/2017 5.9  4.0 - 10.3 K/uL Final  . RBC 02/06/2017 4.89  3.70 - 5.45 MIL/uL Final  . Hemoglobin 02/06/2017 14.2  11.6 - 15.9 g/dL Final  . HCT 02/06/2017 41.3  34.8 - 46.6 % Final  . MCV 02/06/2017 84.4  79.5 - 101.0 fL Final  . MCH 02/06/2017 29.0  25.1 - 34.0 pg Final  . MCHC 02/06/2017 34.4  31.5 - 36.0 g/dL Final  . RDW 02/06/2017 13.0  11.2 - 16.1 % Final  . Platelet Count 02/06/2017 277  145 - 400 K/uL Final  . Neutrophils Relative % 02/06/2017 72  % Final  . Neutro Abs 02/06/2017 4.2  1.5 - 6.5 K/uL Final  . Abs Granulocyte 02/06/2017 4.2  1.5 - 6.5 K/uL Final  . Lymphocytes Relative 02/06/2017 16  % Final  . Lymphs Abs 02/06/2017 0.9  0.9 - 3.3 K/uL Final  . Monocytes Relative 02/06/2017 9  % Final  . Monocytes Absolute 02/06/2017 0.5  0.1 - 0.9 K/uL Final  . Eosinophils Relative 02/06/2017 2  % Final  . Eosinophils Absolute 02/06/2017 0.1  0.0 - 0.5 K/uL Final  . Basophils Relative 02/06/2017 1  % Final  . Basophils Absolute 02/06/2017 0.1  0.0 - 0.1 K/uL Final   Performed at [facility], [place]  [address] [place], [place] [address]    (this displays the last labs from the last 3 days)  No results found for: TOTALPROTELP, ALBUMINELP, A1GS, A2GS, BETS, BETA2SER, GAMS, MSPIKE, SPEI (this displays SPEP labs)  No results found for: KPAFRELGTCHN, LAMBDASER, KAPLAMBRATIO (kappa/lambda light chains)  No results found for: HGBA, HGBA2QUANT, HGBFQUANT, HGBSQUAN (Hemoglobinopathy evaluation)   No results found for: LDH  No results found for: IRON, TIBC, IRONPCTSAT (Iron and TIBC)  No results found for: FERRITIN  Urinalysis No results found for: COLORURINE, APPEARANCEUR, LABSPEC, PHURINE, GLUCOSEU, HGBUR, BILIRUBINUR, KETONESUR, PROTEINUR, UROBILINOGEN, NITRITE, LEUKOCYTESUR   STUDIES: Nm Sentinel Node Inj-no Rpt (breast)  Result Date: 01/08/2017 Sulfur colloid was injected by the nuclear medicine technologist for melanoma sentinel  node.   Mm Breast Surgical Specimen  Result Date: 01/08/2017 CLINICAL DATA:  Right breast cancer status post right lumpectomy. EXAM: SPECIMEN RADIOGRAPH OF THE RIGHT BREAST COMPARISON:  Previous exam(s). FINDINGS: Status post excision of the right breast. The radioactive seed and ribbon biopsy marker clip are present, completely intact, and were marked for pathology. IMPRESSION: Specimen radiograph of the right breast. Electronically Signed   By: Abelardo Diesel M.D.   On: 01/08/2017 12:36   Dg Chest Port 1 View  Result Date: 01/08/2017 CLINICAL DATA:  Port-A-Cath placement EXAM: PORTABLE CHEST 1 VIEW COMPARISON:  01/04/2005 chest radiograph. FINDINGS: Left subclavian Port-A-Cath terminates in the lower third of the superior vena cava. Stable cardiomediastinal silhouette with normal heart size. No pneumothorax. No pleural effusion. Lungs appear clear, with no acute consolidative airspace disease and no pulmonary edema. Surgical clips overlie the right axilla and right breast. IMPRESSION: 1. Left subclavian Port-A-Cath terminates in the lower third of the SVC. No pneumothorax. 2. No active cardiopulmonary disease. Electronically Signed   By: Ilona Sorrel M.D.   On: 01/08/2017 13:55   Dg Fluoro Guide Cv Line-no Report  Result Date: 01/08/2017 Fluoroscopy was utilized by the requesting physician.  No radiographic interpretation.    ELIGIBLE FOR AVAILABLE RESEARCH PROTOCOL: no  ASSESSMENT: 62 y.o. [place] woman status post right breast lower outer quadrant biopsy 12/15/2016 for a clinical T1b N0, stage IA invasive ductal carcinoma, grade 3, triple positive, with an MIB-1 of 15%  (1) Lumpectomy on 01/08/2017 that demonstrated IDC, grade 2, 1.1cm, 4 SLN negative, T1c, N0, stage IA  (2) adjuvant chemotherapy will consist of paclitaxel weekly x12, together with trastuzumab every 21 days to start on 01/31/2017  (a) Echocardiogram on 01/17/2017 demonstrates a LVEF of 65-70%  (3) the trastuzumab to be  continued to total 1 year  (4) adjuvant radiation planned  (5) antiestrogens to follow at the completion of local treatment  (6) genetics testing pending  PLAN: Tenelle is doing very well today.  Her CBC is normal.  I see no sign that she has an infection brewing in her WBC or ANC.  She will proceed with chemotherapy today.  She and I reviewed that should she have bowel issues to contact Dr. Earlean Shawl, and that we can discuss with him as well.  She is planning a trip in the future with her daughter to go wedding dress shopping in [place] during treatment.  I reassured her this trip is ok.  She will return next week for labs and Paclitaxel, and in 2 weeks for labs, f/u with Dr. Jana Hakim, and Paclitaxel.    Skyelynn has a good understanding of the overall plan. She agrees with it. She knows the goal of treatment in her case is cure. She will call  with any problems that may develop before her next visit here.  A total of (30) minutes of face-to-face time was spent with this patient with greater than 50% of that time in counseling and care-coordination.   Scot Dock, NP   02/06/2017 12:41 PM Medical Oncology and Hematology [facility] [address] [place], [place] [address] Tel. [phone]    Fax. [phone]

## 2017-02-06 NOTE — Patient Instructions (Addendum)
[facility] Discharge Instructions for Patients Receiving Chemotherapy  Today you received the following chemotherapy agents Taxol  To help prevent nausea and vomiting after your treatment, we encourage you to take your nausea medication as directed   If you develop nausea and vomiting that is not controlled by your nausea medication, call the clinic.   BELOW ARE SYMPTOMS THAT SHOULD BE REPORTED IMMEDIATELY:  *FEVER GREATER THAN 100.5 F  *CHILLS WITH OR WITHOUT FEVER  NAUSEA AND VOMITING THAT IS NOT CONTROLLED WITH YOUR NAUSEA MEDICATION  *UNUSUAL SHORTNESS OF BREATH  *UNUSUAL BRUISING OR BLEEDING  TENDERNESS IN MOUTH AND THROAT WITH OR WITHOUT PRESENCE OF ULCERS  *URINARY PROBLEMS  *BOWEL PROBLEMS  UNUSUAL RASH Items with * indicate a potential emergency and should be followed up as soon as possible.  Feel free to call the clinic should you have any questions or concerns. The clinic phone number is [phone].  Please show the CHEMO ALERT CARD at check-in to the Emergency Department and triage nurse.   Paclitaxel injection What is this medicine? PACLITAXEL (PAK li TAX el) is a chemotherapy drug. It targets fast dividing cells, like cancer cells, and causes these cells to die. This medicine is used to treat ovarian cancer, breast cancer, and other cancers. This medicine may be used for other purposes; ask your health care provider or pharmacist if you have questions. COMMON BRAND NAME(S): Onxol, Taxol What should I tell my health care provider before I take this medicine? They need to know if you have any of these conditions: -blood disorders -irregular heartbeat -infection (especially a virus infection such as chickenpox, cold sores, or herpes) -liver disease -previous or ongoing radiation therapy -an unusual or allergic reaction to paclitaxel, alcohol, polyoxyethylated castor oil, other chemotherapy agents, other medicines, foods, dyes, or  preservatives -pregnant or trying to get pregnant -breast-feeding How should I use this medicine? This drug is given as an infusion into a vein. It is administered in a hospital or clinic by a specially trained health care professional. Talk to your pediatrician regarding the use of this medicine in children. Special care may be needed. Overdosage: If you think you have taken too much of this medicine contact a poison control center or emergency room at once. NOTE: This medicine is only for you. Do not share this medicine with others. What if I miss a dose? It is important not to miss your dose. Call your doctor or health care professional if you are unable to keep an appointment. What may interact with this medicine? Do not take this medicine with any of the following medications: -disulfiram -metronidazole This medicine may also interact with the following medications: -cyclosporine -diazepam -ketoconazole -medicines to increase blood counts like filgrastim, pegfilgrastim, sargramostim -other chemotherapy drugs like cisplatin, doxorubicin, epirubicin, etoposide, teniposide, vincristine -quinidine -testosterone -vaccines -verapamil Talk to your doctor or health care professional before taking any of these medicines: -acetaminophen -aspirin -ibuprofen -ketoprofen -naproxen This list may not describe all possible interactions. Give your health care provider a list of all the medicines, herbs, non-prescription drugs, or dietary supplements you use. Also tell them if you smoke, drink alcohol, or use illegal drugs. Some items may interact with your medicine. What should I watch for while using this medicine? Your condition will be monitored carefully while you are receiving this medicine. You will need important blood work done while you are taking this medicine. This medicine can cause serious allergic reactions. To reduce your risk you will need to   take other medicine(s) before  treatment with this medicine. If you experience allergic reactions like skin rash, itching or hives, swelling of the face, lips, or tongue, tell your doctor or health care professional right away. In some cases, you may be given additional medicines to help with side effects. Follow all directions for their use. This drug may make you feel generally unwell. This is not uncommon, as chemotherapy can affect healthy cells as well as cancer cells. Report any side effects. Continue your course of treatment even though you feel ill unless your doctor tells you to stop. Call your doctor or health care professional for advice if you get a fever, chills or sore throat, or other symptoms of a cold or flu. Do not treat yourself. This drug decreases your body's ability to fight infections. Try to avoid being around people who are sick. This medicine may increase your risk to bruise or bleed. Call your doctor or health care professional if you notice any unusual bleeding. Be careful brushing and flossing your teeth or using a toothpick because you may get an infection or bleed more easily. If you have any dental work done, tell your dentist you are receiving this medicine. Avoid taking products that contain aspirin, acetaminophen, ibuprofen, naproxen, or ketoprofen unless instructed by your doctor. These medicines may hide a fever. Do not become pregnant while taking this medicine. Women should inform their doctor if they wish to become pregnant or think they might be pregnant. There is a potential for serious side effects to an unborn child. Talk to your health care professional or pharmacist for more information. Do not breast-feed an infant while taking this medicine. Men are advised not to father a child while receiving this medicine. This product may contain alcohol. Ask your pharmacist or healthcare provider if this medicine contains alcohol. Be sure to tell all healthcare providers you are taking this medicine.  Certain medicines, like metronidazole and disulfiram, can cause an unpleasant reaction when taken with alcohol. The reaction includes flushing, headache, nausea, vomiting, sweating, and increased thirst. The reaction can last from 30 minutes to several hours. What side effects may I notice from receiving this medicine? Side effects that you should report to your doctor or health care professional as soon as possible: -allergic reactions like skin rash, itching or hives, swelling of the face, lips, or tongue -low blood counts - This drug may decrease the number of white blood cells, red blood cells and platelets. You may be at increased risk for infections and bleeding. -signs of infection - fever or chills, cough, sore throat, pain or difficulty passing urine -signs of decreased platelets or bleeding - bruising, pinpoint red spots on the skin, black, tarry stools, nosebleeds -signs of decreased red blood cells - unusually weak or tired, fainting spells, lightheadedness -breathing problems -chest pain -high or low blood pressure -mouth sores -nausea and vomiting -pain, swelling, redness or irritation at the injection site -pain, tingling, numbness in the hands or feet -slow or irregular heartbeat -swelling of the ankle, feet, hands Side effects that usually do not require medical attention (report to your doctor or health care professional if they continue or are bothersome): -bone pain -complete hair loss including hair on your head, underarms, pubic hair, eyebrows, and eyelashes -changes in the color of fingernails -diarrhea -loosening of the fingernails -loss of appetite -muscle or joint pain -red flush to skin -sweating This list may not describe all possible side effects. Call your doctor for medical advice about   side effects. You may report side effects to FDA at 1-800-FDA-1088. Where should I keep my medicine? This drug is given in a hospital or clinic and will not be stored at  home. NOTE: This sheet is a summary. It may not cover all possible information. If you have questions about this medicine, talk to your doctor, pharmacist, or health care provider.  2018 Elsevier/Gold Standard (2014-11-17 19:58:00)   

## 2017-02-12 ENCOUNTER — Other Ambulatory Visit: Payer: Self-pay | Admitting: *Deleted

## 2017-02-12 DIAGNOSIS — Z170 Estrogen receptor positive status [ER+]: Principal | ICD-10-CM

## 2017-02-12 DIAGNOSIS — C50511 Malignant neoplasm of lower-outer quadrant of right female breast: Secondary | ICD-10-CM

## 2017-02-13 ENCOUNTER — Inpatient Hospital Stay: Payer: 59

## 2017-02-13 VITALS — BP 154/91 | HR 71 | Temp 97.5°F | Resp 18

## 2017-02-13 DIAGNOSIS — C50511 Malignant neoplasm of lower-outer quadrant of right female breast: Secondary | ICD-10-CM

## 2017-02-13 DIAGNOSIS — Z170 Estrogen receptor positive status [ER+]: Principal | ICD-10-CM

## 2017-02-13 LAB — CMP (CANCER CENTER ONLY)
ALK PHOS: 68 U/L (ref 40–150)
ALT: 24 U/L (ref 0–55)
AST: 14 U/L (ref 5–34)
Albumin: 3.4 g/dL — ABNORMAL LOW (ref 3.5–5.0)
Anion gap: 8 (ref 3–11)
BUN: 21 mg/dL (ref 7–26)
CALCIUM: 9.7 mg/dL (ref 8.4–10.4)
CO2: 29 mmol/L (ref 22–29)
Chloride: 102 mmol/L (ref 98–109)
Creatinine: 0.83 mg/dL (ref 0.60–1.10)
GFR, Estimated: >60 mL/min (ref >60)
GLUCOSE: 91 mg/dL (ref 70–140)
Potassium: 4.5 mmol/L (ref 3.3–4.7)
SODIUM: 139 mmol/L (ref 136–145)
Total Bilirubin: 0.3 mg/dL (ref 0.2–1.2)
Total Protein: 6.8 g/dL (ref 6.4–8.3)

## 2017-02-13 LAB — CBC WITH DIFFERENTIAL (CANCER CENTER ONLY)
Basophils Absolute: 0 10*3/uL (ref 0.0–0.1)
Basophils Relative: 1 %
EOS ABS: 0.1 10*3/uL (ref 0.0–0.5)
Eosinophils Relative: 1 %
HEMATOCRIT: 38.4 % (ref 34.8–46.6)
Hemoglobin: 13.3 g/dL (ref 11.6–15.9)
LYMPHS ABS: 1.8 10*3/uL (ref 0.9–3.3)
LYMPHS PCT: 25 %
MCH: 29.2 pg (ref 25.1–34.0)
MCHC: 34.6 g/dL (ref 31.5–36.0)
MCV: 84.6 fL (ref 79.5–101.0)
Monocytes Absolute: 0.5 10*3/uL (ref 0.1–0.9)
Monocytes Relative: 8 %
NEUTROS ABS: 4.7 10*3/uL (ref 1.5–6.5)
NEUTROS PCT: 65 %
Platelet Count: 415 10*3/uL — ABNORMAL HIGH (ref 145–400)
RBC: 4.54 MIL/uL (ref 3.70–5.45)
RDW: 13.3 % (ref 11.2–16.1)
WBC: 7.2 10*3/uL (ref 3.9–10.3)

## 2017-02-13 MED ORDER — SODIUM CHLORIDE 0.9% FLUSH
10.0000 mL | INTRAVENOUS | Status: DC | PRN
  Administered 2017-02-13: 10 mL
  Filled 2017-02-13: qty 10

## 2017-02-13 MED ORDER — DIPHENHYDRAMINE HCL 50 MG/ML IJ SOLN
25.0000 mg | Freq: Once | INTRAMUSCULAR | Status: AC
  Administered 2017-02-13: 25 mg via INTRAVENOUS

## 2017-02-13 MED ORDER — DEXAMETHASONE SODIUM PHOSPHATE 10 MG/ML IJ SOLN
4.0000 mg | Freq: Once | INTRAMUSCULAR | Status: AC
  Administered 2017-02-13: 4 mg via INTRAVENOUS

## 2017-02-13 MED ORDER — SODIUM CHLORIDE 0.9 % IV SOLN
Freq: Once | INTRAVENOUS | Status: AC
  Administered 2017-02-13: 12:00:00 via INTRAVENOUS

## 2017-02-13 MED ORDER — FAMOTIDINE IN NACL 20-0.9 MG/50ML-% IV SOLN
20.0000 mg | Freq: Once | INTRAVENOUS | Status: AC
  Administered 2017-02-13: 20 mg via INTRAVENOUS

## 2017-02-13 MED ORDER — DEXAMETHASONE SODIUM PHOSPHATE 100 MG/10ML IJ SOLN: 4.0000 mg | Freq: Once | INTRAMUSCULAR | Status: DC

## 2017-02-13 MED ORDER — SODIUM CHLORIDE 0.9 % IV SOLN
80.0000 mg/m2 | Freq: Once | INTRAVENOUS | Status: AC
  Administered 2017-02-13: 120 mg via INTRAVENOUS
  Filled 2017-02-13: qty 20

## 2017-02-13 MED ORDER — HEPARIN SOD (PORK) LOCK FLUSH 100 UNIT/ML IV SOLN
500.0000 [IU] | Freq: Once | INTRAVENOUS | Status: AC | PRN
  Administered 2017-02-13: 500 [IU]
  Filled 2017-02-13: qty 5

## 2017-02-13 NOTE — Patient Instructions (Signed)
[facility] Discharge Instructions for Patients Receiving Chemotherapy  Today you received the following chemotherapy agents:  Taxol.  To help prevent nausea and vomiting after your treatment, we encourage you to take your nausea medication as directed.   If you develop nausea and vomiting that is not controlled by your nausea medication, call the clinic.   BELOW ARE SYMPTOMS THAT SHOULD BE REPORTED IMMEDIATELY:  *FEVER GREATER THAN 100.5 F  *CHILLS WITH OR WITHOUT FEVER  NAUSEA AND VOMITING THAT IS NOT CONTROLLED WITH YOUR NAUSEA MEDICATION  *UNUSUAL SHORTNESS OF BREATH  *UNUSUAL BRUISING OR BLEEDING  TENDERNESS IN MOUTH AND THROAT WITH OR WITHOUT PRESENCE OF ULCERS  *URINARY PROBLEMS  *BOWEL PROBLEMS  UNUSUAL RASH Items with * indicate a potential emergency and should be followed up as soon as possible.  Feel free to call the clinic should you have any questions or concerns. The clinic phone number is [phone].  Please show the CHEMO ALERT CARD at check-in to the Emergency Department and triage nurse.   

## 2017-02-19 ENCOUNTER — Other Ambulatory Visit: Payer: Self-pay | Admitting: *Deleted

## 2017-02-19 ENCOUNTER — Encounter: Payer: Self-pay | Admitting: Genetic Counselor

## 2017-02-19 ENCOUNTER — Other Ambulatory Visit: Payer: 59

## 2017-02-19 ENCOUNTER — Inpatient Hospital Stay (HOSPITAL_BASED_OUTPATIENT_CLINIC_OR_DEPARTMENT_OTHER): Payer: 59 | Admitting: Genetic Counselor

## 2017-02-19 DIAGNOSIS — Z8051 Family history of malignant neoplasm of kidney: Secondary | ICD-10-CM

## 2017-02-19 DIAGNOSIS — C50511 Malignant neoplasm of lower-outer quadrant of right female breast: Secondary | ICD-10-CM

## 2017-02-19 DIAGNOSIS — Z7183 Encounter for nonprocreative genetic counseling: Secondary | ICD-10-CM

## 2017-02-19 DIAGNOSIS — Z170 Estrogen receptor positive status [ER+]: Secondary | ICD-10-CM | POA: Diagnosis not present

## 2017-02-19 DIAGNOSIS — Z808 Family history of malignant neoplasm of other organs or systems: Secondary | ICD-10-CM | POA: Diagnosis not present

## 2017-02-19 NOTE — Progress Notes (Signed)
REFERRING PROVIDER: Chauncey Cruel, MD [place], [place] [address]  PRIMARY PROVIDER:  Jonathon Jordan, MD  PRIMARY REASON FOR VISIT:  1. 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 Family history of kidney cancer   3. Family history of thyroid cancer      HISTORY OF PRESENT ILLNESS:   Katie Marquez, a 62 y.o. female, was seen for a [facility] cancer genetics consultation at the request of Dr. Jana Hakim due to a personal and family history of cancer.  Katie Marquez presents to clinic today to discuss the possibility of a hereditary predisposition to cancer, genetic testing, and to further clarify her future cancer risks, as well as potential cancer risks for family members.   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 This is being treated with taxol and herceptin, she had a lumpectomy, and will have radiation and tamoxifen in the future.    CANCER HISTORY:   No history exists.     HORMONAL RISK FACTORS:  Menarche was at age 51-14.  First live birth at age 32.  OCP use for approximately <1 years.  Ovaries intact: yes.  Hysterectomy: no.  Menopausal status: postmenopausal.  Colonoscopy: yes; patient has sigmoid colectomy due to diverticulosis. Mammogram within the last year: yes. Number of breast biopsies: 1. Up to date with pelvic exams:  yes. Any excessive radiation exposure in the past:  no  Past Medical History:  Diagnosis Date  . Anxiety   . Cancer [facility])    breast cancer  . Diverticulosis   . Family history of kidney cancer   . Family history of thyroid cancer   . Heart murmur   . Hypertension   . IBS (irritable bowel syndrome)   . Iron deficiency   . Osteoarthritis   . PONV (postoperative nausea and vomiting)   . Tremors of nervous system    essential tremors    Past Surgical History:  Procedure  Laterality Date  . BREAST LUMPECTOMY WITH RADIOACTIVE SEED AND SENTINEL LYMPH NODE BIOPSY Right 01/08/2017   Procedure: RIGHT BREAST LUMPECTOMY WITH RADIOACTIVE SEED AND RIGHT SENTINEL LYMPH NODE BIOPSY ERAS PATHWAY;  Surgeon: Fanny Skates, MD;  Location: [facility];  Service: General;  Laterality: Right;  PEC BLOCK  . LAPAROSCOPIC SIGMOID COLECTOMY    . LAPAROSCOPY    . PORTACATH PLACEMENT Left 01/08/2017   Procedure: INSERTION PORT-A-CATH WITH ULTRA SOUND;  Surgeon: Fanny Skates, MD;  Location: [place];  Service: General;  Laterality: Left;    Social History   Socioeconomic History  . Marital status: Married    Spouse name: Not on file  . Number of children: Not on file  . Years of education: Not on file  . Highest education level: Not on file  Social Needs  . Financial resource strain: Not on file  . Food insecurity - worry: Not on file  . Food insecurity - inability: Not on file  . Transportation needs - medical: Not on file  . Transportation needs - non-medical: Not on file  Occupational History  . Not on file  Tobacco Use  . Smoking status: Never Smoker  . Smokeless tobacco: Never Used  Substance and Sexual Activity  . Alcohol use: Yes    Frequency: Never    Comment: occas  . Drug use: No  . Sexual activity: Not on file  Other  Topics Concern  . Not on file  Social History Narrative  . Not on file     FAMILY HISTORY:  We obtained a detailed, 4-generation family history.  Significant diagnoses are listed below: Family History  Problem Relation Age of Onset  . Rheum arthritis Mother   . Hyperlipidemia Mother   . Hypertension Mother   . Thyroid cancer Mother 4  . Diverticulitis Father   . Parkinson's disease Father   . Kidney cancer Father 76  . Multiple sclerosis Maternal Aunt   . Breast cancer Neg Hx     The patient has one daughter who is cancer free.  She has one sister who is alive and cancer free.  Both parents are deceased.  The patient's father had  kidney cancer at age 70.  He was an only child.  Both paternal grandparents are deceased from unknown reasons.  The patient's mother had thyroid cancer at age 44 and she died at 28.  Her mother had two sisters, one who died from [place] and the other is alive at 4.  Both maternal grandparents are deceased.  Katie Marquez is unaware of previous family history of genetic testing for hereditary cancer risks. Patient's maternal ancestors are of [place] descent, and paternal ancestors are of [place] descent. There is reported Ashkenazi Jewish ancestry. There is no known consanguinity.  GENETIC COUNSELING ASSESSMENT: Katie Marquez is a 62 y.o. female with a personal history of breast cancer and family history of cancer which is somewhat suggestive of a hereditary cancer syndrome vs sporadic cancer and predisposition to cancer. We, therefore, discussed and recommended the following at today's visit.   DISCUSSION: We disucssed that about 5-10% of breast cancer is hereditary with most cases due to BRCA mutations.  About 1/300-1/400 individuals in the general population have a BRCA mutation.  The patient has Ashkenazi Jewish ancestry, and women with Jewish ancestry have a 1/40 risk for a BRCA mutation.  There are 3 common mutations within the Jewish population that identifies approximately 90% of BRCA cases.  10% of cases are mutations outside of these three mutations.  We discussed that there are other genes that can increase the risk for  Breast cancer, most commonly ATM, CHEK2 and PALB2. We reviewed the characteristics, features and inheritance patterns of hereditary cancer syndromes. We also discussed genetic testing, including the appropriate family members to test, the process of testing, insurance coverage and turn-around-time for results. We discussed the implications of a negative, positive and/or variant of uncertain significant result. We recommended Katie Marquez pursue genetic testing for the common hereditary  cancer gene panel and renal cancer panel.  The Hereditary Gene Panel offered by [facility] includes sequencing and/or deletion duplication testing of the following 47 genes: APC, ATM, AXIN2, BARD1, BMPR1A, BRCA1, BRCA2, BRIP1, CDH1, CDK4, CDKN2A (p14ARF), CDKN2A (p16INK4a), CHEK2, CTNNA1, DICER1, EPCAM (Deletion/duplication testing only), GREM1 (promoter region deletion/duplication testing only), KIT, MEN1, MLH1, MSH2, MSH3, MSH6, MUTYH, NBN, NF1, NHTL1, PALB2, PDGFRA, PMS2, POLD1, POLE, PTEN, RAD50, RAD51C, RAD51D, SDHB, SDHC, SDHD, SMAD4, SMARCA4. STK11, TP53, TSC1, TSC2, and VHL.  The following genes were evaluated for sequence changes only: SDHA and HOXB13 c.251G>A variant only. The Renal/Urinary Tract cancer panel offered by [facility] includes sequencing and/or deletion/duplication testing of the following 30 genes: BAP1, BUB1B, CDC73, CEP57, CDKN1C, DICER1, DIS3L2,  EPCAM, FH, FLCN, GPC3, MET, MITF, MLH1, MSH2, MSH6, PALB2, PMS2, PTEN, SDHA, SDHB, SDHC, SDHD, SMARCA4, SMARCB1, TP53, TSC1, TSC2, VHL, and WT1.  Based on Katie Marquez's personal and  family history of cancer, she meets medical criteria for genetic testing. Despite that she meets criteria, she may still have an out of pocket cost. We discussed that if her out of pocket cost for testing is over $100, the laboratory will call and confirm whether she wants to proceed with testing.  If the out of pocket cost of testing is less than $100 she will be billed by the genetic testing laboratory.   PLAN: After considering the risks, benefits, and limitations, Katie Marquez  provided informed consent to pursue genetic testing and the blood sample was sent to [facility] for analysis of the Common hereditary cancer panel and renal cancer panel. Blood will be drawn when she comes in for chemotherapy.  Results should be available within approximately 2-3 weeks' time, at which point they will be disclosed by telephone to Katie Marquez, as will any additional  recommendations warranted by these results. Katie Marquez will receive a summary of her genetic counseling visit and a copy of her results once available. This information will also be available in Epic. We encouraged Katie Marquez to remain in contact with cancer genetics annually so that we can continuously update the family history and inform her of any changes in cancer genetics and testing that may be of benefit for her family. Katie Marquez questions were answered to her satisfaction today. Our contact information was provided should additional questions or concerns arise.  Lastly, we encouraged Katie Marquez to remain in contact with cancer genetics annually so that we can continuously update the family history and inform her of any changes in cancer genetics and testing that may be of benefit for this family.   Ms.  Marquez questions were answered to her satisfaction today. Our contact information was provided should additional questions or concerns arise. Thank you for the referral and allowing [place] to share in the care of your patient.   Loella Hickle P. Florene Glen, [place], [facility] Certified Genetic Counselor [place] [email or web address] phone: [phone]  The patient was seen for a total of 45 minutes in face-to-face genetic counseling.  This patient was discussed with Drs. Magrinat, Lindi Adie and/or Burr Medico who agrees with the above.    _______________________________________________________________________ For Office Staff:  Number of people involved in session: 1 Was an Intern/ student involved with case: no

## 2017-02-20 ENCOUNTER — Telehealth: Payer: Self-pay | Admitting: Oncology

## 2017-02-20 ENCOUNTER — Inpatient Hospital Stay: Payer: 59

## 2017-02-20 ENCOUNTER — Encounter: Payer: Self-pay | Admitting: *Deleted

## 2017-02-20 ENCOUNTER — Inpatient Hospital Stay (HOSPITAL_BASED_OUTPATIENT_CLINIC_OR_DEPARTMENT_OTHER): Payer: 59 | Admitting: Oncology

## 2017-02-20 VITALS — BP 139/90 | HR 81 | Temp 98.4°F | Resp 18 | Ht 62.0 in | Wt 115.2 lb

## 2017-02-20 DIAGNOSIS — C50511 Malignant neoplasm of lower-outer quadrant of right female breast: Secondary | ICD-10-CM

## 2017-02-20 DIAGNOSIS — I10 Essential (primary) hypertension: Secondary | ICD-10-CM | POA: Diagnosis not present

## 2017-02-20 DIAGNOSIS — Z5111 Encounter for antineoplastic chemotherapy: Secondary | ICD-10-CM | POA: Diagnosis not present

## 2017-02-20 DIAGNOSIS — Z808 Family history of malignant neoplasm of other organs or systems: Secondary | ICD-10-CM

## 2017-02-20 DIAGNOSIS — R251 Tremor, unspecified: Secondary | ICD-10-CM

## 2017-02-20 DIAGNOSIS — Z170 Estrogen receptor positive status [ER+]: Principal | ICD-10-CM

## 2017-02-20 DIAGNOSIS — Z79899 Other long term (current) drug therapy: Secondary | ICD-10-CM | POA: Diagnosis not present

## 2017-02-20 DIAGNOSIS — R011 Cardiac murmur, unspecified: Secondary | ICD-10-CM

## 2017-02-20 DIAGNOSIS — K589 Irritable bowel syndrome without diarrhea: Secondary | ICD-10-CM

## 2017-02-20 DIAGNOSIS — F419 Anxiety disorder, unspecified: Secondary | ICD-10-CM | POA: Diagnosis not present

## 2017-02-20 DIAGNOSIS — M1990 Unspecified osteoarthritis, unspecified site: Secondary | ICD-10-CM | POA: Diagnosis not present

## 2017-02-20 DIAGNOSIS — K5900 Constipation, unspecified: Secondary | ICD-10-CM | POA: Diagnosis not present

## 2017-02-20 DIAGNOSIS — Z8051 Family history of malignant neoplasm of kidney: Secondary | ICD-10-CM | POA: Diagnosis not present

## 2017-02-20 LAB — CMP (CANCER CENTER ONLY)
ALK PHOS: 61 U/L (ref 40–150)
ALT: 17 U/L (ref 0–55)
ANION GAP: 6 (ref 3–11)
AST: 14 U/L (ref 5–34)
Albumin: 3.6 g/dL (ref 3.5–5.0)
BUN: 18 mg/dL (ref 7–26)
CHLORIDE: 103 mmol/L (ref 98–109)
CO2: 29 mmol/L (ref 22–29)
CREATININE: 0.82 mg/dL (ref 0.60–1.10)
Calcium: 9.8 mg/dL (ref 8.4–10.4)
GFR, Estimated: >60 mL/min (ref >60)
GLUCOSE: 76 mg/dL (ref 70–140)
Potassium: 4.7 mmol/L (ref 3.3–4.7)
Sodium: 138 mmol/L (ref 136–145)
Total Bilirubin: 0.3 mg/dL (ref 0.2–1.2)
Total Protein: 7 g/dL (ref 6.4–8.3)

## 2017-02-20 LAB — CBC WITH DIFFERENTIAL (CANCER CENTER ONLY)
Basophils Absolute: 0 10*3/uL (ref 0.0–0.1)
Basophils Relative: 1 %
Eosinophils Absolute: 0.1 10*3/uL (ref 0.0–0.5)
Eosinophils Relative: 1 %
HEMATOCRIT: 39.8 % (ref 34.8–46.6)
HEMOGLOBIN: 13.4 g/dL (ref 11.6–15.9)
LYMPHS ABS: 1.8 10*3/uL (ref 0.9–3.3)
Lymphocytes Relative: 28 %
MCH: 29.1 pg (ref 25.1–34.0)
MCHC: 33.7 g/dL (ref 31.5–36.0)
MCV: 86.5 fL (ref 79.5–101.0)
MONO ABS: 0.5 10*3/uL (ref 0.1–0.9)
MONOS PCT: 7 %
NEUTROS ABS: 4.1 10*3/uL (ref 1.5–6.5)
NEUTROS PCT: 63 %
Platelet Count: 447 10*3/uL — ABNORMAL HIGH (ref 145–400)
RBC: 4.6 MIL/uL (ref 3.70–5.45)
RDW: 12.9 % (ref 11.2–16.1)
WBC Count: 6.4 10*3/uL (ref 3.9–10.3)

## 2017-02-20 MED ORDER — HEPARIN SOD (PORK) LOCK FLUSH 100 UNIT/ML IV SOLN
500.0000 [IU] | Freq: Once | INTRAVENOUS | Status: AC | PRN
  Administered 2017-02-20: 500 [IU]
  Filled 2017-02-20: qty 5

## 2017-02-20 MED ORDER — SODIUM CHLORIDE 0.9% FLUSH
10.0000 mL | INTRAVENOUS | Status: DC | PRN
  Administered 2017-02-20: 10 mL
  Filled 2017-02-20: qty 10

## 2017-02-20 MED ORDER — SODIUM CHLORIDE 0.9 % IV SOLN: 4.0000 mg | Freq: Once | INTRAVENOUS | Status: DC

## 2017-02-20 MED ORDER — TRASTUZUMAB CHEMO 150 MG IV SOLR
300.0000 mg | Freq: Once | INTRAVENOUS | Status: AC
  Administered 2017-02-20: 300 mg via INTRAVENOUS
  Filled 2017-02-20: qty 14.29

## 2017-02-20 MED ORDER — ACETAMINOPHEN 325 MG PO TABS
650.0000 mg | ORAL_TABLET | Freq: Once | ORAL | Status: AC
  Administered 2017-02-20: 650 mg via ORAL

## 2017-02-20 MED ORDER — PROCHLORPERAZINE MALEATE 10 MG PO TABS
10.0000 mg | ORAL_TABLET | Freq: Once | ORAL | Status: AC
Start: 2017-02-20 — End: 2017-02-20
  Administered 2017-02-20: 10 mg via ORAL

## 2017-02-20 MED ORDER — SODIUM CHLORIDE 0.9 % IV SOLN
Freq: Once | INTRAVENOUS | Status: AC
  Administered 2017-02-20: 13:00:00 via INTRAVENOUS

## 2017-02-20 MED ORDER — SODIUM CHLORIDE 0.9 % IV SOLN
80.0000 mg/m2 | Freq: Once | INTRAVENOUS | Status: AC
  Administered 2017-02-20: 120 mg via INTRAVENOUS
  Filled 2017-02-20: qty 20

## 2017-02-20 MED ORDER — FAMOTIDINE IN NACL 20-0.9 MG/50ML-% IV SOLN
20.0000 mg | Freq: Once | INTRAVENOUS | Status: AC
  Administered 2017-02-20: 20 mg via INTRAVENOUS

## 2017-02-20 MED ORDER — DEXAMETHASONE SODIUM PHOSPHATE 10 MG/ML IJ SOLN
4.0000 mg | Freq: Once | INTRAMUSCULAR | Status: AC
  Administered 2017-02-20: 13:00:00 via INTRAVENOUS

## 2017-02-20 MED ORDER — DIPHENHYDRAMINE HCL 50 MG/ML IJ SOLN
25.0000 mg | Freq: Once | INTRAMUSCULAR | Status: AC
  Administered 2017-02-20: 25 mg via INTRAVENOUS

## 2017-02-20 MED ORDER — PROCHLORPERAZINE EDISYLATE 5 MG/ML IJ SOLN: 10.0000 mg | Freq: Once | INTRAMUSCULAR | Status: DC

## 2017-02-20 NOTE — Patient Instructions (Signed)
[facility] Discharge Instructions for Patients Receiving Chemotherapy  Today you received the following chemotherapy agents Paclitaxel; Herceptin  To help prevent nausea and vomiting after your treatment, we encourage you to take your nausea medication as directed  If you develop nausea and vomiting that is not controlled by your nausea medication, call the clinic.   BELOW ARE SYMPTOMS THAT SHOULD BE REPORTED IMMEDIATELY:  *FEVER GREATER THAN 100.5 F  *CHILLS WITH OR WITHOUT FEVER  NAUSEA AND VOMITING THAT IS NOT CONTROLLED WITH YOUR NAUSEA MEDICATION  *UNUSUAL SHORTNESS OF BREATH  *UNUSUAL BRUISING OR BLEEDING  TENDERNESS IN MOUTH AND THROAT WITH OR WITHOUT PRESENCE OF ULCERS  *URINARY PROBLEMS  *BOWEL PROBLEMS  UNUSUAL RASH Items with * indicate a potential emergency and should be followed up as soon as possible.  Feel free to call the clinic should you have any questions or concerns. The clinic phone number is [phone].  Please show the [place] at check-in to the Emergency Department and triage nurse.

## 2017-02-20 NOTE — Telephone Encounter (Signed)
Gave patient AVs and calendar of upcoming February appointments.  °

## 2017-02-20 NOTE — Progress Notes (Signed)
[facility]  Telephone:([phone]) [phone] Fax:([phone]) [phone]     ID: [name] DOB: October 15, 1955  MR#: [id number]  KXF#:[id number]  Patient Care Team: Jonathon Jordan, MD as PCP - General (Family Medicine) Fanny Skates, MD as Consulting Physician (General Surgery) Magrinat, Virgie Dad, MD as Consulting Physician (Oncology) Richmond Campbell, MD as Consulting Physician (Gastroenterology) Eppie Gibson, MD as Attending Physician (Radiation Oncology) Lovey Newcomer, MD as Attending Physician (Radiology) Bensimhon, Shaune Pascal, MD as Consulting Physician (Cardiology) Molli Posey, MD as Consulting Physician (Obstetrics and Gynecology) OTHER MD:  CHIEF COMPLAINT: Triple positive breast cancer  CURRENT TREATMENT: Paclitaxel and Trastuzumab   HISTORY OF CURRENT ILLNESS: From the original intake note:  Katie Marquez had bilateral screening mammography with tomography at the breast center 12/12/2016, showing a possible mass in the right breast.  Right breast diagnostic mammography with tomography and right breast ultrasonography 12/15/2016 found the breast density to be category D.  In the lower right breast there was a small lobulated mass which was not palpable.  Ultrasound showed a 0.8 cm mass in the right breast 7:00 radiant 2 cm from the nipple.  The right axilla was benign.  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  The patient's subsequent history is as detailed below.  INTERVAL HISTORY: Katie Marquez returns today for a follow-up of her triple positive breast cancer. She is accompanied by her husband. She continues on weekly paclitaxel, today being the fourth of 12 planned doses.   She also receives  trastuzumab every 3 weeks, with a dose due today.  Her most recent echocardiogram, 01/17/2017, shows an excellent ejection fraction   REVIEW OF SYSTEMS: Petrita is doing well, overall. She reports mild side affects that include a slight change in her taste buds, one episode of chills, one episode of nausea and fatigue. She recently started walking every day for 25 minutes on the treadmill. She is trying to avoid big crowds. Raushanah states that she hasn't been drinking as much water as she should be. She denies unusual headaches, visual changes, vomiting, or dizziness. There has been no unusual cough, phlegm production, or pleurisy. This been no change in bowel or bladder habits. She denies unexplained weight loss, bleeding, rash, or fever. A detailed review of systems was otherwise noncontributory.     PAST MEDICAL HISTORY: Past Medical History:  Diagnosis Date  . Anxiety   . Cancer [facility])    breast cancer  . Diverticulosis   . Family history of kidney cancer   . Family history of thyroid cancer   . Heart murmur   . Hypertension   . IBS (irritable bowel syndrome)   . Iron deficiency   . Osteoarthritis   . PONV (postoperative nausea and vomiting)   . Tremors of nervous system    essential tremors    PAST SURGICAL HISTORY: Past Surgical History:  Procedure Laterality Date  . BREAST LUMPECTOMY WITH RADIOACTIVE SEED AND SENTINEL LYMPH NODE BIOPSY Right 01/08/2017   Procedure: RIGHT BREAST LUMPECTOMY WITH RADIOACTIVE SEED AND RIGHT SENTINEL LYMPH NODE BIOPSY ERAS PATHWAY;  Surgeon: Fanny Skates, MD;  Location: [facility];  Service: General;  Laterality: Right;  PEC BLOCK  . LAPAROSCOPIC SIGMOID COLECTOMY    . LAPAROSCOPY    . PORTACATH PLACEMENT  Left 01/08/2017   Procedure: INSERTION PORT-A-CATH WITH ULTRA SOUND;  Surgeon: Fanny Skates, MD;  Location: [facility] OR;  Service: General;  Laterality: Left;    FAMILY HISTORY Family History  Problem Relation Age of Onset  . Rheum arthritis  Mother   . Hyperlipidemia Mother   . Hypertension Mother   . Thyroid cancer Mother 34  . Diverticulitis Father   . Parkinson's disease Father   . Kidney cancer Father 55  . Multiple sclerosis Maternal Aunt   . Breast cancer Neg Hx   Katie Marquez comes from an [place] background.  Her father died at 64 with Parkinson's disease and complications from tobacco abuse including a history of renal cancer; the patient's mother died at age 64, with a history of rheumatoid arthritis and thyroid cancer.  The patient had no brothers, 1 sister, in good health.  There is no history of breast or ovarian cancer in the family  GYNECOLOGIC HISTORY:  No LMP recorded. Patient is postmenopausal. Menarche age 23, first live birth age 19.  The patient is GX P1.  She had infertility treatments after her first live birth but they were not successful.  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  Their daughter [name] lives in [place].  She attended [employer] and then [employer] in business and psychology.  She is planning to get married in September 2019.  Her fiance is in finance.     ADVANCED DIRECTIVES:    HEALTH MAINTENANCE: Social History   Tobacco Use  . Smoking status: Never Smoker  . Smokeless tobacco: Never Used  Substance Use Topics  . Alcohol use: Yes    Frequency: Never    Comment: occas  . Drug use: No     Colonoscopy: [facility]  PAP: [place]  Bone density: Osteopenia, remote test   No Known Allergies  Current Outpatient Medications  Medication Sig Dispense Refill  . CALCIUM PO Take by mouth daily.    . cholecalciferol (VITAMIN D) 1000 units tablet Take 2,000 Units by mouth daily.    Marland Kitchen FLUoxetine (PROZAC) 10 MG capsule Take 20 mg by mouth daily.     Marland Kitchen lidocaine-prilocaine (EMLA) cream Apply  to affected area once 30 g 3  . lisinopril (PRINIVIL,ZESTRIL) 20 MG tablet Take 20 mg by mouth daily.    Marland Kitchen LORazepam (ATIVAN) 0.5 MG tablet Take 1 tablet (0.5 mg total) by mouth every 6 (six) hours as needed (Nausea or vomiting). 30 tablet 0  . ondansetron (ZOFRAN) 8 MG tablet Take 1 tablet (8 mg total) by mouth 2 (two) times daily as needed (Nausea or vomiting). 30 tablet 1  . Probiotic Product (PROBIOTIC-10 PO) Take by mouth.    . prochlorperazine (COMPAZINE) 10 MG tablet Take 1 tablet (10 mg total) by mouth every 6 (six) hours as needed (Nausea or vomiting). 30 tablet 1  . Red Yeast Rice 600 MG CAPS Take by mouth.     No current facility-administered medications for this visit.     OBJECTIVE: Middle-aged white woman who appears well  Vitals:   02/20/17 1119  BP: 139/90  Pulse: 81  Resp: 18  Temp: 98.4 F (36.9 C)  SpO2: 99%     Body mass index is 21.07 kg/m.   Wt Readings from Last 3 Encounters:  02/20/17 115 lb 3.2  oz (52.3 kg)  02/06/17 114 lb 4.8 oz (51.8 kg)  01/26/17 117 lb 11.2 oz (53.4 kg)      ECOG FS:0 - Asymptomatic   Sclerae unicteric, pupils round and equal Oropharynx clear and moist No cervical or supraclavicular adenopathy Lungs no rales or rhonchi Heart regular rate and rhythm Abd soft, nontender, positive bowel sounds MSK no focal spinal tenderness, no upper extremity lymphedema Neuro: nonfocal, well oriented, appropriate affect Breasts: The right breast is status post lumpectomy.  The cosmetic result is excellent.  There is no residual mass erythema or swelling.  The left breast is unremarkable.  Both axillae are benign.    LAB RESULTS:  CMP     Component Value Date/Time   NA 138 02/20/2017 1042   NA 141 01/31/2017 1225   K 4.7 02/20/2017 1042   K 4.1 01/31/2017 1225   CL 103 02/20/2017 1042   CO2 29 02/20/2017 1042   CO2 29 01/31/2017 1225   GLUCOSE 76 02/20/2017 1042   GLUCOSE 86 01/31/2017 1225   BUN 18 02/20/2017 1042   BUN 17.4  01/31/2017 1225   CREATININE 0.8 01/31/2017 1225   CALCIUM 9.8 02/20/2017 1042   CALCIUM 10.0 01/31/2017 1225   PROT 7.0 02/20/2017 1042   PROT 7.2 01/31/2017 1225   ALBUMIN 3.6 02/20/2017 1042   ALBUMIN 4.0 01/31/2017 1225   AST 14 02/20/2017 1042   AST 18 01/31/2017 1225   ALT 17 02/20/2017 1042   ALT 29 01/31/2017 1225   ALKPHOS 61 02/20/2017 1042   ALKPHOS 66 01/31/2017 1225   BILITOT 0.3 02/20/2017 1042   BILITOT 0.57 01/31/2017 1225   GFRNONAA >60 02/20/2017 1042   GFRAA >60 02/20/2017 1042    No results found for: TOTALPROTELP, ALBUMINELP, A1GS, A2GS, BETS, BETA2SER, GAMS, MSPIKE, SPEI  No results found for: Nils Pyle, [facility]  Lab Results  Component Value Date   WBC 6.4 02/20/2017   NEUTROABS 4.1 02/20/2017   HGB 14.4 01/31/2017   HCT 39.8 02/20/2017   MCV 86.5 02/20/2017   PLT 447 (H) 02/20/2017      Chemistry      Component Value Date/Time   NA 138 02/20/2017 1042   NA 141 01/31/2017 1225   K 4.7 02/20/2017 1042   K 4.1 01/31/2017 1225   CL 103 02/20/2017 1042   CO2 29 02/20/2017 1042   CO2 29 01/31/2017 1225   BUN 18 02/20/2017 1042   BUN 17.4 01/31/2017 1225   CREATININE 0.8 01/31/2017 1225      Component Value Date/Time   CALCIUM 9.8 02/20/2017 1042   CALCIUM 10.0 01/31/2017 1225   ALKPHOS 61 02/20/2017 1042   ALKPHOS 66 01/31/2017 1225   AST 14 02/20/2017 1042   AST 18 01/31/2017 1225   ALT 17 02/20/2017 1042   ALT 29 01/31/2017 1225   BILITOT 0.3 02/20/2017 1042   BILITOT 0.57 01/31/2017 1225       No results found for: LABCA2  No components found for: ZMOQHU765  No results for input(s): INR in the last 168 hours.  No results found for: LABCA2  No results found for: YYT035  No results found for: WSF681  No results found for: EXN170  No results found for: CA2729  No components found for: HGQUANT  No results found for: CEA1 / No results found for: CEA1   No results found for: AFPTUMOR  No results  found for: [place]  No results found for: PSA1  Appointment on 02/20/2017  Component Date  Value Ref Range Status  . Sodium 02/20/2017 138  136 - 145 mmol/L Final  . Potassium 02/20/2017 4.7  3.3 - 4.7 mmol/L Final  . Chloride 02/20/2017 103  98 - 109 mmol/L Final  . CO2 02/20/2017 29  22 - 29 mmol/L Final  . Glucose, Bld 02/20/2017 76  70 - 140 mg/dL Final  . BUN 02/20/2017 18  7 - 26 mg/dL Final  . Creatinine 02/20/2017 0.82  0.60 - 1.10 mg/dL Final  . Calcium 02/20/2017 9.8  8.4 - 10.4 mg/dL Final  . Total Protein 02/20/2017 7.0  6.4 - 8.3 g/dL Final  . Albumin 02/20/2017 3.6  3.5 - 5.0 g/dL Final  . AST 02/20/2017 14  5 - 34 U/L Final  . ALT 02/20/2017 17  0 - 55 U/L Final  . Alkaline Phosphatase 02/20/2017 61  40 - 150 U/L Final  . Total Bilirubin 02/20/2017 0.3  0.2 - 1.2 mg/dL Final  . GFR, Est Non Af Am 02/20/2017 >60  >60 mL/min Final  . GFR, Est AFR Am 02/20/2017 >60  >60 mL/min Final   Comment: (NOTE) The eGFR has been calculated using the CKD EPI equation. This calculation has not been validated in all clinical situations. eGFR's persistently <60 mL/min signify possible Chronic Kidney Disease.   Georgiann Hahn gap 02/20/2017 6  3 - 11 Final   Performed at [facility], [place] [address], [place], [place] [address]  . WBC Count 02/20/2017 6.4  3.9 - 10.3 K/uL Final  . RBC 02/20/2017 4.60  3.70 - 5.45 MIL/uL Final  . Hemoglobin 02/20/2017 13.4  11.6 - 15.9 g/dL Final  . HCT 02/20/2017 39.8  34.8 - 46.6 % Final  . MCV 02/20/2017 86.5  79.5 - 101.0 fL Final  . MCH 02/20/2017 29.1  25.1 - 34.0 pg Final  . MCHC 02/20/2017 33.7  31.5 - 36.0 g/dL Final  . RDW 02/20/2017 12.9  11.2 - 16.1 % Final  . Platelet Count 02/20/2017 447* 145 - 400 K/uL Final  . Neutrophils Relative % 02/20/2017 63  % Final  . Neutro Abs 02/20/2017 4.1  1.5 - 6.5 K/uL Final  . Lymphocytes Relative 02/20/2017 28  % Final  . Lymphs Abs 02/20/2017 1.8  0.9 - 3.3 K/uL Final  .  Monocytes Relative 02/20/2017 7  % Final  . Monocytes Absolute 02/20/2017 0.5  0.1 - 0.9 K/uL Final  . Eosinophils Relative 02/20/2017 1  % Final  . Eosinophils Absolute 02/20/2017 0.1  0.0 - 0.5 K/uL Final  . Basophils Relative 02/20/2017 1  % Final  . Basophils Absolute 02/20/2017 0.0  0.0 - 0.1 K/uL Final   Performed at [facility], [place] [address] [place], [place] [address]    (this displays the last labs from the last 3 days)  No results found for: TOTALPROTELP, ALBUMINELP, A1GS, A2GS, BETS, BETA2SER, GAMS, MSPIKE, SPEI (this displays SPEP labs)  No results found for: KPAFRELGTCHN, LAMBDASER, KAPLAMBRATIO (kappa/lambda light chains)  No results found for: HGBA, HGBA2QUANT, HGBFQUANT, HGBSQUAN (Hemoglobinopathy evaluation)   No results found for: LDH  No results found for: IRON, TIBC, IRONPCTSAT (Iron and TIBC)  No results found for: FERRITIN  Urinalysis No results found for: COLORURINE, APPEARANCEUR, LABSPEC, PHURINE, GLUCOSEU, HGBUR, BILIRUBINUR, KETONESUR, PROTEINUR, UROBILINOGEN, NITRITE, LEUKOCYTESUR   STUDIES: No results found.  ELIGIBLE FOR AVAILABLE RESEARCH PROTOCOL: no  ASSESSMENT: 62 y.o. [place] woman status post right breast lower outer quadrant biopsy 12/15/2016 for a clinical T1b N0, stage  IA invasive ductal carcinoma, grade 3, triple positive, with an MIB-1 of 15%  (1) Lumpectomy on 01/08/2017 that demonstrated IDC, grade 2, 1.1cm, 4 SLN negative, T1c, N0, stage IA  (2) adjuvant chemotherapy will consist of paclitaxel weekly x12, together with trastuzumab every 21 days to start on 01/31/2017  (a) Echocardiogram on 01/17/2017 demonstrates a LVEF of 65-70%  (3) the trastuzumab to be continued to total 1 year  (4) adjuvant radiation planned  (5) antiestrogens to follow at the completion of local treatment  (6) genetics testing pending  PLAN: Katie Marquez is tolerating the chemotherapy remarkably well.  In particular she has not  yet begun to lose her hair.  She also has absolutely no peripheral neuropathy symptoms.  She has gone back to exercising which is exactly the right move.  I have encouraged her to go to movies, restaurants, and other group activities so long as she stays away from hospitals, nursing homes, or babies.  She understands the need to keep her hands washed at all times and she should carry of course the alcohol gel with her so when people shake her hands she can get then sterilized.  I reminded her about the neuropathy issue.  I am going to be seeing her every other dose until we get to the eighth dose after which I will see her every dose.  If we do begin to develop neuropathy we will simply stop for a week and see if it clears.  If it does not we would stop the Taxol but of course we are going to continue the trastuzumab for a total of 1 year  She knows to call for any other issues that may develop before the next visit  Magrinat, Virgie Dad, MD  02/20/17 11:46 AM Medical Oncology and Hematology [facility] [place], [place] [address] Tel. [phone]    Fax. [phone]  This document serves as a record of services personally performed by Chauncey Cruel, MD. It was created on his behalf by Margit Banda, a trained medical scribe. The creation of this record is based on the scribe's personal observations and the provider's statements to them.   I have reviewed the above documentation for accuracy and completeness, and I agree with the above.

## 2017-02-20 NOTE — Addendum Note (Signed)
Addended by: Chauncey Cruel on: 02/20/2017 12:33 PM   Modules accepted: Orders

## 2017-02-26 ENCOUNTER — Other Ambulatory Visit: Payer: Self-pay | Admitting: *Deleted

## 2017-02-26 DIAGNOSIS — C50511 Malignant neoplasm of lower-outer quadrant of right female breast: Secondary | ICD-10-CM

## 2017-02-26 DIAGNOSIS — Z170 Estrogen receptor positive status [ER+]: Principal | ICD-10-CM

## 2017-02-27 ENCOUNTER — Inpatient Hospital Stay: Payer: 59

## 2017-02-27 DIAGNOSIS — C50511 Malignant neoplasm of lower-outer quadrant of right female breast: Secondary | ICD-10-CM

## 2017-02-27 DIAGNOSIS — Z170 Estrogen receptor positive status [ER+]: Principal | ICD-10-CM

## 2017-02-27 LAB — CBC WITH DIFFERENTIAL (CANCER CENTER ONLY)
BASOS ABS: 0.1 10*3/uL (ref 0.0–0.1)
BASOS PCT: 1 %
EOS PCT: 2 %
Eosinophils Absolute: 0.1 10*3/uL (ref 0.0–0.5)
HCT: 39.7 % (ref 34.8–46.6)
Hemoglobin: 13.6 g/dL (ref 11.6–15.9)
Lymphocytes Relative: 25 %
Lymphs Abs: 1.7 10*3/uL (ref 0.9–3.3)
MCH: 29.3 pg (ref 25.1–34.0)
MCHC: 34.3 g/dL (ref 31.5–36.0)
MCV: 85.4 fL (ref 79.5–101.0)
Monocytes Absolute: 0.7 10*3/uL (ref 0.1–0.9)
Monocytes Relative: 9 %
NEUTROS ABS: 4.3 10*3/uL (ref 1.5–6.5)
Neutrophils Relative %: 63 %
PLATELETS: 409 10*3/uL — AB (ref 145–400)
RBC: 4.66 MIL/uL (ref 3.70–5.45)
RDW: 13.6 % (ref 11.2–16.1)
WBC Count: 6.9 10*3/uL (ref 3.9–10.3)

## 2017-02-27 LAB — CMP (CANCER CENTER ONLY)
ALT: 25 U/L (ref 0–55)
ANION GAP: 7 (ref 3–11)
AST: 19 U/L (ref 5–34)
Albumin: 3.6 g/dL (ref 3.5–5.0)
Alkaline Phosphatase: 64 U/L (ref 40–150)
BUN: 21 mg/dL (ref 7–26)
CO2: 31 mmol/L — AB (ref 22–29)
Calcium: 9.9 mg/dL (ref 8.4–10.4)
Chloride: 103 mmol/L (ref 98–109)
Creatinine: 0.82 mg/dL (ref 0.60–1.10)
Glucose, Bld: 84 mg/dL (ref 70–140)
Potassium: 4.4 mmol/L (ref 3.3–4.7)
SODIUM: 141 mmol/L (ref 136–145)
TOTAL PROTEIN: 7 g/dL (ref 6.4–8.3)
Total Bilirubin: 0.3 mg/dL (ref 0.2–1.2)

## 2017-02-27 MED ORDER — DIPHENHYDRAMINE HCL 50 MG/ML IJ SOLN
25.0000 mg | Freq: Once | INTRAMUSCULAR | Status: AC
  Administered 2017-02-27: 25 mg via INTRAVENOUS

## 2017-02-27 MED ORDER — PACLITAXEL CHEMO INJECTION 300 MG/50ML
80.0000 mg/m2 | Freq: Once | INTRAVENOUS | Status: AC
  Administered 2017-02-27: 120 mg via INTRAVENOUS
  Filled 2017-02-27: qty 20

## 2017-02-27 MED ORDER — SODIUM CHLORIDE 0.9% FLUSH
10.0000 mL | INTRAVENOUS | Status: DC | PRN
  Administered 2017-02-27: 10 mL
  Filled 2017-02-27: qty 10

## 2017-02-27 MED ORDER — PROCHLORPERAZINE EDISYLATE 5 MG/ML IJ SOLN
10.0000 mg | Freq: Once | INTRAMUSCULAR | Status: AC
  Administered 2017-02-27: 10 mg via INTRAVENOUS

## 2017-02-27 MED ORDER — FAMOTIDINE IN NACL 20-0.9 MG/50ML-% IV SOLN
20.0000 mg | Freq: Once | INTRAVENOUS | Status: AC
  Administered 2017-02-27: 20 mg via INTRAVENOUS

## 2017-02-27 MED ORDER — SODIUM CHLORIDE 0.9 % IV SOLN
Freq: Once | INTRAVENOUS | Status: AC
  Administered 2017-02-27: 12:00:00 via INTRAVENOUS

## 2017-02-27 MED ORDER — DEXAMETHASONE SODIUM PHOSPHATE 10 MG/ML IJ SOLN
4.0000 mg | Freq: Once | INTRAMUSCULAR | Status: AC
  Administered 2017-02-27: 4 mg via INTRAVENOUS

## 2017-02-27 MED ORDER — HEPARIN SOD (PORK) LOCK FLUSH 100 UNIT/ML IV SOLN
500.0000 [IU] | Freq: Once | INTRAVENOUS | Status: AC | PRN
  Administered 2017-02-27: 500 [IU]
  Filled 2017-02-27: qty 5

## 2017-02-27 NOTE — Patient Instructions (Signed)
[facility] Discharge Instructions for Patients Receiving Chemotherapy  Today you received the following chemotherapy agents Paclitaxel  To help prevent nausea and vomiting after your treatment, we encourage you to take your nausea medication as directed   If you develop nausea and vomiting that is not controlled by your nausea medication, call the clinic.   BELOW ARE SYMPTOMS THAT SHOULD BE REPORTED IMMEDIATELY:  *FEVER GREATER THAN 100.5 F  *CHILLS WITH OR WITHOUT FEVER  NAUSEA AND VOMITING THAT IS NOT CONTROLLED WITH YOUR NAUSEA MEDICATION  *UNUSUAL SHORTNESS OF BREATH  *UNUSUAL BRUISING OR BLEEDING  TENDERNESS IN MOUTH AND THROAT WITH OR WITHOUT PRESENCE OF ULCERS  *URINARY PROBLEMS  *BOWEL PROBLEMS  UNUSUAL RASH Items with * indicate a potential emergency and should be followed up as soon as possible.  Feel free to call the clinic should you have any questions or concerns. The clinic phone number is [phone].  Please show the CHEMO ALERT CARD at check-in to the Emergency Department and triage nurse.   

## 2017-02-27 NOTE — Progress Notes (Signed)
No show

## 2017-03-06 ENCOUNTER — Inpatient Hospital Stay: Payer: 59

## 2017-03-06 ENCOUNTER — Inpatient Hospital Stay (HOSPITAL_BASED_OUTPATIENT_CLINIC_OR_DEPARTMENT_OTHER): Payer: 59 | Admitting: Oncology

## 2017-03-06 ENCOUNTER — Inpatient Hospital Stay: Payer: 59 | Attending: Adult Health

## 2017-03-06 VITALS — BP 136/92 | HR 76 | Temp 98.7°F | Resp 18 | Ht 62.0 in | Wt 116.7 lb

## 2017-03-06 DIAGNOSIS — Z5111 Encounter for antineoplastic chemotherapy: Secondary | ICD-10-CM | POA: Insufficient documentation

## 2017-03-06 DIAGNOSIS — Z79899 Other long term (current) drug therapy: Secondary | ICD-10-CM | POA: Diagnosis not present

## 2017-03-06 DIAGNOSIS — Z8051 Family history of malignant neoplasm of kidney: Secondary | ICD-10-CM | POA: Insufficient documentation

## 2017-03-06 DIAGNOSIS — Z170 Estrogen receptor positive status [ER+]: Secondary | ICD-10-CM | POA: Insufficient documentation

## 2017-03-06 DIAGNOSIS — C50511 Malignant neoplasm of lower-outer quadrant of right female breast: Secondary | ICD-10-CM

## 2017-03-06 DIAGNOSIS — Z5112 Encounter for antineoplastic immunotherapy: Secondary | ICD-10-CM | POA: Insufficient documentation

## 2017-03-06 DIAGNOSIS — R011 Cardiac murmur, unspecified: Secondary | ICD-10-CM | POA: Diagnosis not present

## 2017-03-06 DIAGNOSIS — I10 Essential (primary) hypertension: Secondary | ICD-10-CM | POA: Diagnosis not present

## 2017-03-06 DIAGNOSIS — D509 Iron deficiency anemia, unspecified: Secondary | ICD-10-CM | POA: Diagnosis not present

## 2017-03-06 DIAGNOSIS — M1990 Unspecified osteoarthritis, unspecified site: Secondary | ICD-10-CM | POA: Diagnosis not present

## 2017-03-06 DIAGNOSIS — K589 Irritable bowel syndrome without diarrhea: Secondary | ICD-10-CM | POA: Insufficient documentation

## 2017-03-06 DIAGNOSIS — R232 Flushing: Secondary | ICD-10-CM | POA: Diagnosis not present

## 2017-03-06 DIAGNOSIS — F419 Anxiety disorder, unspecified: Secondary | ICD-10-CM | POA: Diagnosis not present

## 2017-03-06 DIAGNOSIS — Z808 Family history of malignant neoplasm of other organs or systems: Secondary | ICD-10-CM | POA: Diagnosis not present

## 2017-03-06 LAB — CMP (CANCER CENTER ONLY)
ALT: 30 U/L (ref 0–55)
AST: 21 U/L (ref 5–34)
Albumin: 3.5 g/dL (ref 3.5–5.0)
Alkaline Phosphatase: 63 U/L (ref 40–150)
Anion gap: 6 (ref 3–11)
BILIRUBIN TOTAL: 0.4 mg/dL (ref 0.2–1.2)
BUN: 18 mg/dL (ref 7–26)
CO2: 29 mmol/L (ref 22–29)
Calcium: 9.6 mg/dL (ref 8.4–10.4)
Chloride: 101 mmol/L (ref 98–109)
Creatinine: 0.81 mg/dL (ref 0.60–1.10)
Glucose, Bld: 98 mg/dL (ref 70–140)
POTASSIUM: 4.6 mmol/L (ref 3.5–5.1)
Sodium: 136 mmol/L (ref 136–145)
TOTAL PROTEIN: 6.7 g/dL (ref 6.4–8.3)

## 2017-03-06 LAB — CBC WITH DIFFERENTIAL (CANCER CENTER ONLY)
BASOS ABS: 0 10*3/uL (ref 0.0–0.1)
Basophils Relative: 1 %
EOS PCT: 3 %
Eosinophils Absolute: 0.2 10*3/uL (ref 0.0–0.5)
HCT: 38.3 % (ref 34.8–46.6)
Hemoglobin: 13.1 g/dL (ref 11.6–15.9)
LYMPHS PCT: 26 %
Lymphs Abs: 1.6 10*3/uL (ref 0.9–3.3)
MCH: 29.6 pg (ref 25.1–34.0)
MCHC: 34.2 g/dL (ref 31.5–36.0)
MCV: 86.5 fL (ref 79.5–101.0)
Monocytes Absolute: 0.4 10*3/uL (ref 0.1–0.9)
Monocytes Relative: 7 %
Neutro Abs: 4 10*3/uL (ref 1.5–6.5)
Neutrophils Relative %: 63 %
PLATELETS: 289 10*3/uL (ref 145–400)
RBC: 4.43 MIL/uL (ref 3.70–5.45)
RDW: 13.7 % (ref 11.2–14.5)
WBC: 6.3 10*3/uL (ref 3.9–10.3)

## 2017-03-06 MED ORDER — SODIUM CHLORIDE 0.9 % IV SOLN
80.0000 mg/m2 | Freq: Once | INTRAVENOUS | Status: AC
  Administered 2017-03-06: 120 mg via INTRAVENOUS
  Filled 2017-03-06: qty 20

## 2017-03-06 MED ORDER — DEXAMETHASONE SODIUM PHOSPHATE 10 MG/ML IJ SOLN
INTRAMUSCULAR | Status: AC
  Filled 2017-03-06: qty 1

## 2017-03-06 MED ORDER — FAMOTIDINE IN NACL 20-0.9 MG/50ML-% IV SOLN
INTRAVENOUS | Status: AC
  Filled 2017-03-06: qty 50

## 2017-03-06 MED ORDER — SODIUM CHLORIDE 0.9% FLUSH
10.0000 mL | INTRAVENOUS | Status: DC | PRN
  Administered 2017-03-06: 10 mL
  Filled 2017-03-06: qty 10

## 2017-03-06 MED ORDER — DEXAMETHASONE SODIUM PHOSPHATE 10 MG/ML IJ SOLN
4.0000 mg | Freq: Once | INTRAMUSCULAR | Status: AC
  Administered 2017-03-06: 4 mg via INTRAVENOUS

## 2017-03-06 MED ORDER — DIPHENHYDRAMINE HCL 50 MG/ML IJ SOLN
INTRAMUSCULAR | Status: AC
  Filled 2017-03-06: qty 1

## 2017-03-06 MED ORDER — PROCHLORPERAZINE EDISYLATE 5 MG/ML IJ SOLN
INTRAMUSCULAR | Status: AC
  Filled 2017-03-06: qty 2

## 2017-03-06 MED ORDER — HEPARIN SOD (PORK) LOCK FLUSH 100 UNIT/ML IV SOLN
500.0000 [IU] | Freq: Once | INTRAVENOUS | Status: AC | PRN
  Administered 2017-03-06: 500 [IU]
  Filled 2017-03-06: qty 5

## 2017-03-06 MED ORDER — SODIUM CHLORIDE 0.9 % IV SOLN
Freq: Once | INTRAVENOUS | Status: AC
  Administered 2017-03-06: 13:00:00 via INTRAVENOUS

## 2017-03-06 MED ORDER — PROCHLORPERAZINE EDISYLATE 5 MG/ML IJ SOLN
10.0000 mg | Freq: Once | INTRAMUSCULAR | Status: AC
  Administered 2017-03-06: 10 mg via INTRAVENOUS

## 2017-03-06 MED ORDER — DIPHENHYDRAMINE HCL 50 MG/ML IJ SOLN
25.0000 mg | Freq: Once | INTRAMUSCULAR | Status: AC
  Administered 2017-03-06: 25 mg via INTRAVENOUS

## 2017-03-06 MED ORDER — FAMOTIDINE IN NACL 20-0.9 MG/50ML-% IV SOLN
20.0000 mg | Freq: Once | INTRAVENOUS | Status: AC
  Administered 2017-03-06: 20 mg via INTRAVENOUS

## 2017-03-06 MED ORDER — ALTEPLASE 2 MG IJ SOLR
INTRAMUSCULAR | Status: AC
  Filled 2017-03-06: qty 2

## 2017-03-06 MED ORDER — ALTEPLASE 2 MG IJ SOLR
2.0000 mg | Freq: Once | INTRAMUSCULAR | Status: AC | PRN
  Administered 2017-03-06: 2 mg
  Filled 2017-03-06: qty 2

## 2017-03-08 ENCOUNTER — Telehealth: Payer: Self-pay | Admitting: Genetic Counselor

## 2017-03-08 ENCOUNTER — Ambulatory Visit: Payer: Self-pay | Admitting: Genetic Counselor

## 2017-03-08 ENCOUNTER — Encounter: Payer: Self-pay | Admitting: Genetic Counselor

## 2017-03-08 DIAGNOSIS — Z808 Family history of malignant neoplasm of other organs or systems: Secondary | ICD-10-CM

## 2017-03-08 DIAGNOSIS — Z1379 Encounter for other screening for genetic and chromosomal anomalies: Secondary | ICD-10-CM

## 2017-03-08 DIAGNOSIS — Z8051 Family history of malignant neoplasm of kidney: Secondary | ICD-10-CM

## 2017-03-08 DIAGNOSIS — Z170 Estrogen receptor positive status [ER+]: Secondary | ICD-10-CM

## 2017-03-08 DIAGNOSIS — C50511 Malignant neoplasm of lower-outer quadrant of right female breast: Secondary | ICD-10-CM

## 2017-03-08 NOTE — Progress Notes (Signed)
HPI: Ms. Feild was previously seen in the [facility] clinic due to a personal and family history of cancer and concerns regarding a hereditary predisposition to cancer. Please refer to our prior cancer genetics clinic note for more information regarding Ms. Bamford's medical, social and family histories, and our assessment and recommendations, at the time. Ms. Zetino recent genetic test results were disclosed to her, as were recommendations warranted by these results. These results and recommendations are discussed in more detail below.  CANCER HISTORY:    Carcinoma of lower-outer quadrant of right breast in female, estrogen receptor positive ([place])   01/02/2017 Initial Diagnosis    Carcinoma of lower-outer quadrant of right breast in female, estrogen receptor positive ([facility] [place])      03/07/2017 Genetic Testing    Negative genetic testing on a 60 gene panel that reviewed genes associated with common hereditary cancer syndromes and renal cancer syndromes.  The Hereditary Gene Panel offered by [facility] includes sequencing and/or deletion duplication testing of the following 47 genes: APC, ATM, AXIN2, BARD1, BMPR1A, BRCA1, BRCA2, BRIP1, CDH1, CDK4, CDKN2A (p14ARF), CDKN2A (p16INK4a), CHEK2, CTNNA1, DICER1, EPCAM (Deletion/duplication testing only), GREM1 (promoter region deletion/duplication testing only), KIT, MEN1, MLH1, MSH2, MSH3, MSH6, MUTYH, NBN, NF1, NHTL1, PALB2, PDGFRA, PMS2, POLD1, POLE, PTEN, RAD50, RAD51C, RAD51D, SDHB, SDHC, SDHD, SMAD4, SMARCA4. STK11, TP53, TSC1, TSC2, and VHL.  The following genes were evaluated for sequence changes only: SDHA and HOXB13 c.251G>A variant only. The Renal/Urinary Tract cancer panel offered by [facility] includes sequencing and/or deletion/duplication testing of the following 30 genes: BAP1, BUB1B, CDC73, CEP57, CDKN1C, DICER1, DIS3L2,  EPCAM, FH, FLCN, GPC3, MET, MITF, MLH1, MSH2, MSH6, PALB2, PMS2, PTEN, SDHA, SDHB, SDHC, SDHD, SMARCA4, SMARCB1, TP53,  TSC1, TSC2, VHL, and WT1. The report date is March 07, 2017.        FAMILY HISTORY:  We obtained a detailed, 4-generation family history.  Significant diagnoses are listed below: Family History  Problem Relation Age of Onset  . Rheum arthritis Mother   . Hyperlipidemia Mother   . Hypertension Mother   . Thyroid cancer Mother 81  . Diverticulitis Father   . Parkinson's disease Father   . Kidney cancer Father 52  . Multiple sclerosis Maternal Aunt   . Breast cancer Neg Hx     The patient has one daughter who is cancer free.  She has one sister who is alive and cancer free.  Both parents are deceased.  The patient's father had kidney cancer at age 1.  He was an only child.  Both paternal grandparents are deceased from unknown reasons.  The patient's mother had thyroid cancer at age 87 and she died at 40.  Her mother had two sisters, one who died from [place] and the other is alive at 30.  Both maternal grandparents are deceased.  Ms. Micallef is unaware of previous family history of genetic testing for hereditary cancer risks. Patient's maternal ancestors are of [place] descent, and paternal ancestors are of [place] descent. There is reported Ashkenazi Jewish ancestry. There is no known consanguinity.  GENETIC TEST RESULTS: Genetic testing reported out on March 07, 2017 through the common hereditary cancer panel and the renal cancer panel found no deleterious mutations.  The Hereditary Gene Panel offered by [facility] includes sequencing and/or deletion duplication testing of the following 47 genes: APC, ATM, AXIN2, BARD1, BMPR1A, BRCA1, BRCA2, BRIP1, CDH1, CDK4, CDKN2A (p14ARF), CDKN2A (p16INK4a), CHEK2, CTNNA1, DICER1, EPCAM (Deletion/duplication testing only), GREM1 (promoter region deletion/duplication testing only), KIT, MEN1, MLH1,  MSH2, MSH3, MSH6, MUTYH, NBN, NF1, NHTL1, PALB2, PDGFRA, PMS2, POLD1, POLE, PTEN, RAD50, RAD51C, RAD51D, SDHB, SDHC, SDHD, SMAD4, SMARCA4. STK11, TP53, TSC1,  TSC2, and VHL.  The following genes were evaluated for sequence changes only: SDHA and HOXB13 c.251G>A variant only. The Renal/Urinary Tract cancer panel offered by [facility] includes sequencing and/or deletion/duplication testing of the following 30 genes: BAP1, BUB1B, CDC73, CEP57, CDKN1C, DICER1, DIS3L2,  EPCAM, FH, FLCN, GPC3, MET, MITF, MLH1, MSH2, MSH6, PALB2, PMS2, PTEN, SDHA, SDHB, SDHC, SDHD, SMARCA4, SMARCB1, TP53, TSC1, TSC2, VHL, and WT1.   The test report has been scanned into EPIC and is located under the Molecular Pathology section of the Results Review tab.   We discussed with Ms. Jolin that since the current genetic testing is not perfect, it is possible there may be a gene mutation in one of these genes that current testing cannot detect, but that chance is small. We also discussed, that it is possible that another gene that has not yet been discovered, or that we have not yet tested, is responsible for the cancer diagnoses in the family, and it is, therefore, important to remain in touch with cancer genetics in the future so that we can continue to offer Ms. Mancebo the most up to date genetic testing.     CANCER SCREENING RECOMMENDATIONS: This result is reassuring and indicates that Ms. Smyre likely does not have an increased risk for a future cancer due to a mutation in one of these genes. This normal test also suggests that Ms. Tavares's cancer was most likely not due to an inherited predisposition associated with one of these genes.  Most cancers happen by chance and this negative test suggests that her cancer falls into this category.  We, therefore, recommended she continue to follow the cancer management and screening guidelines provided by her oncology and primary healthcare provider.   RECOMMENDATIONS FOR FAMILY MEMBERS: Women in this family might be at some increased risk of developing cancer, over the general population risk, simply due to the family history of cancer. We  recommended women in this family have a yearly mammogram beginning at age 38, or 23 years younger than the earliest onset of cancer, an annual clinical breast exam, and perform monthly breast self-exams. Women in this family should also have a gynecological exam as recommended by their primary provider. All family members should have a colonoscopy by age 62.  FOLLOW-UP: Lastly, we discussed with Ms. Osmundson that cancer genetics is a rapidly advancing field and it is possible that new genetic tests will be appropriate for her and/or her family members in the future. We encouraged her to remain in contact with cancer genetics on an annual basis so we can update her personal and family histories and let her know of advances in cancer genetics that may benefit this family.   Our contact number was provided. Ms. Walpole questions were answered to her satisfaction, and she knows she is welcome to call us at anytime with additional questions or concerns.   Roma Kayser, MS, [facility] Certified Genetic Counselor [email or web address]

## 2017-03-08 NOTE — Telephone Encounter (Signed)
Revealed negative genetic testing.  Discussed that we do not know why she has breast cancer or why there is cancer in the family. It could be due to a different gene that we are not testing, or maybe our current technology may not be able to pick something up.  It will be important for her to keep in contact with genetics to keep up with whether additional testing may be needed. 

## 2017-03-13 ENCOUNTER — Inpatient Hospital Stay: Payer: 59

## 2017-03-13 ENCOUNTER — Other Ambulatory Visit: Payer: Self-pay | Admitting: Oncology

## 2017-03-13 VITALS — BP 139/98 | HR 78 | Temp 98.6°F | Resp 17 | Wt 117.0 lb

## 2017-03-13 DIAGNOSIS — Z170 Estrogen receptor positive status [ER+]: Principal | ICD-10-CM

## 2017-03-13 DIAGNOSIS — C50511 Malignant neoplasm of lower-outer quadrant of right female breast: Secondary | ICD-10-CM

## 2017-03-13 LAB — CMP (CANCER CENTER ONLY)
ALT: 33 U/L (ref 0–55)
AST: 21 U/L (ref 5–34)
Albumin: 3.8 g/dL (ref 3.5–5.0)
Alkaline Phosphatase: 61 U/L (ref 40–150)
Anion gap: 9 (ref 3–11)
BUN: 17 mg/dL (ref 7–26)
CALCIUM: 9.3 mg/dL (ref 8.4–10.4)
CO2: 24 mmol/L (ref 22–29)
CREATININE: 0.83 mg/dL (ref 0.60–1.10)
Chloride: 102 mmol/L (ref 98–109)
GLUCOSE: 127 mg/dL (ref 70–140)
Potassium: 4.2 mmol/L (ref 3.5–5.1)
Sodium: 135 mmol/L — ABNORMAL LOW (ref 136–145)
TOTAL PROTEIN: 6.8 g/dL (ref 6.4–8.3)
Total Bilirubin: 0.5 mg/dL (ref 0.2–1.2)

## 2017-03-13 LAB — CBC WITH DIFFERENTIAL (CANCER CENTER ONLY)
BASOS PCT: 1 %
Basophils Absolute: 0 10*3/uL (ref 0.0–0.1)
EOS ABS: 0.2 10*3/uL (ref 0.0–0.5)
EOS PCT: 3 %
HCT: 38.9 % (ref 34.8–46.6)
Hemoglobin: 13.4 g/dL (ref 11.6–15.9)
Lymphocytes Relative: 24 %
Lymphs Abs: 1.6 10*3/uL (ref 0.9–3.3)
MCH: 29.6 pg (ref 25.1–34.0)
MCHC: 34.4 g/dL (ref 31.5–36.0)
MCV: 86.1 fL (ref 79.5–101.0)
MONO ABS: 0.7 10*3/uL (ref 0.1–0.9)
MONOS PCT: 10 %
NEUTROS ABS: 4.2 10*3/uL (ref 1.5–6.5)
Neutrophils Relative %: 62 %
Platelet Count: 335 10*3/uL (ref 145–400)
RBC: 4.52 MIL/uL (ref 3.70–5.45)
RDW: 14.2 % (ref 11.2–14.5)
WBC Count: 6.8 10*3/uL (ref 3.9–10.3)
nRBC: 0 /100 WBC

## 2017-03-13 MED ORDER — PROCHLORPERAZINE EDISYLATE 5 MG/ML IJ SOLN
INTRAMUSCULAR | Status: AC
  Filled 2017-03-13: qty 2

## 2017-03-13 MED ORDER — HEPARIN SOD (PORK) LOCK FLUSH 100 UNIT/ML IV SOLN
500.0000 [IU] | Freq: Once | INTRAVENOUS | Status: AC | PRN
  Administered 2017-03-13: 500 [IU]
  Filled 2017-03-13: qty 5

## 2017-03-13 MED ORDER — ACETAMINOPHEN 325 MG PO TABS
ORAL_TABLET | ORAL | Status: AC
  Filled 2017-03-13: qty 2

## 2017-03-13 MED ORDER — SODIUM CHLORIDE 0.9 % IV SOLN
300.0000 mg | Freq: Once | INTRAVENOUS | Status: AC
  Administered 2017-03-13: 300 mg via INTRAVENOUS
  Filled 2017-03-13: qty 14.29

## 2017-03-13 MED ORDER — DEXAMETHASONE SODIUM PHOSPHATE 10 MG/ML IJ SOLN
INTRAMUSCULAR | Status: AC
  Filled 2017-03-13: qty 1

## 2017-03-13 MED ORDER — DIPHENHYDRAMINE HCL 50 MG/ML IJ SOLN
INTRAMUSCULAR | Status: AC
  Filled 2017-03-13: qty 1

## 2017-03-13 MED ORDER — DEXAMETHASONE SODIUM PHOSPHATE 10 MG/ML IJ SOLN
4.0000 mg | Freq: Once | INTRAMUSCULAR | Status: AC
Start: 2017-03-13 — End: 2017-03-13
  Administered 2017-03-13: 4 mg via INTRAVENOUS

## 2017-03-13 MED ORDER — FAMOTIDINE IN NACL 20-0.9 MG/50ML-% IV SOLN: 20.0000 mg | Freq: Once | INTRAVENOUS | Status: DC

## 2017-03-13 MED ORDER — SODIUM CHLORIDE 0.9% FLUSH
10.0000 mL | INTRAVENOUS | Status: DC | PRN
  Administered 2017-03-13: 10 mL
  Filled 2017-03-13: qty 10

## 2017-03-13 MED ORDER — DIPHENHYDRAMINE HCL 25 MG PO CAPS: 25.0000 mg | ORAL_CAPSULE | Freq: Once | ORAL | Status: DC

## 2017-03-13 MED ORDER — SODIUM CHLORIDE 0.9 % IV SOLN
Freq: Once | INTRAVENOUS | Status: AC
Start: 2017-03-13 — End: 2017-03-13
  Administered 2017-03-13: 12:00:00 via INTRAVENOUS

## 2017-03-13 MED ORDER — ACETAMINOPHEN 325 MG PO TABS
650.0000 mg | ORAL_TABLET | Freq: Once | ORAL | Status: AC
  Administered 2017-03-13: 650 mg via ORAL

## 2017-03-13 MED ORDER — SODIUM CHLORIDE 0.9 % IV SOLN
20.0000 mg | Freq: Once | INTRAVENOUS | Status: AC
  Administered 2017-03-13: 20 mg via INTRAVENOUS
  Filled 2017-03-13: qty 2

## 2017-03-13 MED ORDER — PROCHLORPERAZINE EDISYLATE 5 MG/ML IJ SOLN
10.0000 mg | Freq: Once | INTRAMUSCULAR | Status: AC
  Administered 2017-03-13: 10 mg via INTRAVENOUS

## 2017-03-13 MED ORDER — DIPHENHYDRAMINE HCL 50 MG/ML IJ SOLN
25.0000 mg | Freq: Once | INTRAMUSCULAR | Status: AC
  Administered 2017-03-13: 25 mg via INTRAVENOUS

## 2017-03-13 MED ORDER — SODIUM CHLORIDE 0.9 % IV SOLN
80.0000 mg/m2 | Freq: Once | INTRAVENOUS | Status: AC
  Administered 2017-03-13: 120 mg via INTRAVENOUS
  Filled 2017-03-13: qty 20

## 2017-03-13 MED ORDER — PROCHLORPERAZINE MALEATE 10 MG PO TABS
ORAL_TABLET | ORAL | Status: AC
  Filled 2017-03-13: qty 1

## 2017-03-13 NOTE — Patient Instructions (Signed)
[facility] Discharge Instructions for Patients Receiving Chemotherapy  Today you received the following chemotherapy agents Paclitaxel; Herceptin  To help prevent nausea and vomiting after your treatment, we encourage you to take your nausea medication as directed  If you develop nausea and vomiting that is not controlled by your nausea medication, call the clinic.   BELOW ARE SYMPTOMS THAT SHOULD BE REPORTED IMMEDIATELY:  *FEVER GREATER THAN 100.5 F  *CHILLS WITH OR WITHOUT FEVER  NAUSEA AND VOMITING THAT IS NOT CONTROLLED WITH YOUR NAUSEA MEDICATION  *UNUSUAL SHORTNESS OF BREATH  *UNUSUAL BRUISING OR BLEEDING  TENDERNESS IN MOUTH AND THROAT WITH OR WITHOUT PRESENCE OF ULCERS  *URINARY PROBLEMS  *BOWEL PROBLEMS  UNUSUAL RASH Items with * indicate a potential emergency and should be followed up as soon as possible.  Feel free to call the clinic should you have any questions or concerns. The clinic phone number is [phone].  Please show the [facility] at check-in to the Emergency Department and triage nurse.

## 2017-03-16 NOTE — Progress Notes (Signed)
[facility]  [phone] [phone]     ID: Katie Marquez DOB: 20-Oct-1955  MR#: [id number]  ZWC#:[id number]  Patient Care Team: Jonathon Jordan, MD as PCP - General (Family Medicine) Fanny Skates, MD as Consulting Physician (General Surgery) Magrinat, Virgie Dad, MD as Consulting Physician (Oncology) Richmond Campbell, MD as Consulting Physician (Gastroenterology) Eppie Gibson, MD as Attending Physician (Radiation Oncology) Lovey Newcomer, MD as Attending Physician (Radiology) Bensimhon, Shaune Pascal, MD as Consulting Physician (Cardiology) Molli Posey, MD as Consulting Physician (Obstetrics and Gynecology) OTHER MD:  CHIEF COMPLAINT: Triple positive breast cancer  CURRENT TREATMENT: Paclitaxel and Trastuzumab   HISTORY OF CURRENT ILLNESS: From the original intake note:  Katie Marquez had bilateral screening mammography with tomography at the breast center 12/12/2016, showing a possible mass in the right breast.  Right breast diagnostic mammography with tomography and right breast ultrasonography 12/15/2016 found the breast density to be category D.  In the lower right breast there was a small lobulated mass which was not palpable.  Ultrasound showed a 0.8 cm mass in the right breast 7:00 radiant 2 cm from the nipple.  The right axilla was benign.  Biopsy of the right breast mass in question 12/15/2016 showed ([id number]) and invasive ductal carcinoma, grade 3, estrogen receptor at 95% positive, progesterone receptor 50% positive, both with strong staining intensity, with an MIB-1 of 15%, and HER-2 amplification, the signals ratio being 1.83 but the number per cell 10.23.  The patient's subsequent history is as detailed below.  INTERVAL HISTORY: Katie Marquez returns today for a follow-up of her triple positive breast cancer accompanied by her husband. She continues on weekly paclitaxel, with today being cycle 8 of 12 planned cycles. She tolerates this treatment  well. She denies any symptoms suggestive of peripheral neuropathy.  She also receives trastuzumab every 21 days, with her most recent dose 03/13/2017. She also tolerates this well. She notes that she has frequent hot flashes at night, but they usually do not occur in the day.  She notes that after chemotherapy, she will follow up with Dr. Isidore Moos for radiation treatments.   REVIEW OF SYSTEMS: Katie Marquez reports that she is doing fine. She notes that her hair is slowly growing back, and she never completely lost her hair. She notes that her eye sight has slightly changed back to a previous prescription. She reports a slight clear nasal discharge. She notes stretching and pulling in the left axilla for which she is scheduled for PT. She notes that her weight has slightly increased. She notes that she has gone swimming twice, but she had friends/family over this weekend and ate plenty of food. She notes that regularly, she is not much of a breakfast person, but she eats yogurt or a granola bar in the morning. For lunch, she may have fruits and vegetables or a sandwich. She notes that for dinner, she may eat chicken salad but she tries to avoid carbohydrates. She notes that she had 2 glasses of wine for the 1st time this weekend. She denies unusual headaches, nausea, vomiting, or dizziness. There has been no unusual cough, phlegm production, or pleurisy. This been no change in bowel or bladder habits. She denies unexplained fatigue or unexplained weight loss, bleeding, rash, or fever. A detailed review of systems was otherwise stable.     PAST MEDICAL HISTORY: Past Medical History:  Diagnosis Date  . Anxiety   . Cancer [facility])    breast cancer  . Diverticulosis   . Family history  of kidney cancer   . Family history of thyroid cancer   . Heart murmur   . Hypertension   . IBS (irritable bowel syndrome)   . Iron deficiency   . Osteoarthritis   . PONV (postoperative nausea and vomiting)   . Tremors of  nervous system    essential tremors    PAST SURGICAL HISTORY: Past Surgical History:  Procedure Laterality Date  . BREAST LUMPECTOMY WITH RADIOACTIVE SEED AND SENTINEL LYMPH NODE BIOPSY Right 01/08/2017   Procedure: RIGHT BREAST LUMPECTOMY WITH RADIOACTIVE SEED AND RIGHT SENTINEL LYMPH NODE BIOPSY ERAS PATHWAY;  Surgeon: Fanny Skates, MD;  Location: [facility];  Service: General;  Laterality: Right;  PEC BLOCK  . LAPAROSCOPIC SIGMOID COLECTOMY    . LAPAROSCOPY    . PORTACATH PLACEMENT Left 01/08/2017   Procedure: INSERTION PORT-A-CATH WITH ULTRA SOUND;  Surgeon: Fanny Skates, MD;  [facility];  Service: General;  Laterality: Left;    FAMILY HISTORY Family History  Problem Relation Age of Onset  . Rheum arthritis Mother   . Hyperlipidemia Mother   . Hypertension Mother   . Thyroid cancer Mother 78  . Diverticulitis Father   . Parkinson's disease Father   . Kidney cancer Father 52  . Multiple sclerosis Maternal Aunt   . Breast cancer Neg Hx   Katie Marquez comes from an [place] background.  Her father died at 43 with Parkinson's disease and complications from tobacco abuse including a history of renal cancer; the patient's mother died at age 106, with a history of rheumatoid arthritis and thyroid cancer.  The patient had no brothers, 1 sister, in good health.  There is no history of breast or ovarian cancer in the family  GYNECOLOGIC HISTORY:  No LMP recorded. Patient is postmenopausal. Menarche age 24, first live birth age 84.  The patient is GX P1.  She had infertility treatments after her first live birth but they were not successful.  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  Their daughter Katie Marquez lives in [place].  She attended [employer] and then the [employer] in business and  psychology.  She is planning to get married in September 2019.  Her fiance is in finance.     ADVANCED DIRECTIVES:    HEALTH MAINTENANCE: Social History   Tobacco Use  . Smoking status: Never Smoker  . Smokeless tobacco: Never Used  Substance Use Topics  . Alcohol use: Yes    Frequency: Never    Comment: occas  . Drug use: No     Colonoscopy: [facility]  PAP: [place]  Bone density: Osteopenia, remote test   No Known Allergies  Current Outpatient Medications  Medication Sig Dispense Refill  . CALCIUM PO Take by mouth daily.    . cholecalciferol (VITAMIN D) 1000 units tablet Take 2,000 Units by mouth daily.    Marland Kitchen FLUoxetine (PROZAC) 10 MG capsule Take 20 mg by mouth daily.     Marland Kitchen lidocaine-prilocaine (EMLA) cream Apply to affected area once 30 g 3  . lisinopril (PRINIVIL,ZESTRIL) 20 MG tablet Take 20 mg by mouth daily.    Marland Kitchen LORazepam (ATIVAN) 0.5 MG tablet Take 1 tablet (0.5 mg total) by mouth every 6 (six) hours as needed (Nausea or vomiting). 30 tablet 0  . ondansetron (ZOFRAN) 8 MG tablet Take 1 tablet (8  mg total) by mouth 2 (two) times daily as needed (Nausea or vomiting). 30 tablet 1  . Probiotic Product (PROBIOTIC-10 PO) Take by mouth.    . prochlorperazine (COMPAZINE) 10 MG tablet Take 1 tablet (10 mg total) by mouth every 6 (six) hours as needed (Nausea or vomiting). 30 tablet 1  . Red Yeast Rice 600 MG CAPS Take by mouth.     No current facility-administered medications for this visit.     OBJECTIVE: Middle-aged white woman in no acute distress  Vitals:   03/20/17 0956  BP: (!) 145/90  Pulse: 71  Resp: 18  Temp: 98.4 F (36.9 C)  SpO2: 100%     Body mass index is 21.71 kg/m.   Wt Readings from Last 3 Encounters:  03/20/17 118 lb 11.2 oz (53.8 kg)  03/13/17 117 lb (53.1 kg)  03/06/17 116 lb 11.2 oz (52.9 kg)      ECOG FS:0 - Asymptomatic   Sclerae unicteric, EOMs intact Oropharynx clear and moist No cervical or supraclavicular adenopathy Lungs no rales  or rhonchi Heart regular rate and rhythm Abd soft, nontender, positive bowel sounds MSK no focal spinal tenderness, no upper extremity lymphedema Neuro: nonfocal, well oriented, appropriate affect Breasts: Deferred  LAB RESULTS:  CMP     Component Value Date/Time   NA 135 (L) 03/13/2017 1123   NA 141 01/31/2017 1225   K 4.2 03/13/2017 1123   K 4.1 01/31/2017 1225   CL 102 03/13/2017 1123   CO2 24 03/13/2017 1123   CO2 29 01/31/2017 1225   GLUCOSE 127 03/13/2017 1123   GLUCOSE 86 01/31/2017 1225   BUN 17 03/13/2017 1123   BUN 17.4 01/31/2017 1225   CREATININE 0.83 03/13/2017 1123   CREATININE 0.8 01/31/2017 1225   CALCIUM 9.3 03/13/2017 1123   CALCIUM 10.0 01/31/2017 1225   PROT 6.8 03/13/2017 1123   PROT 7.2 01/31/2017 1225   ALBUMIN 3.8 03/13/2017 1123   ALBUMIN 4.0 01/31/2017 1225   AST 21 03/13/2017 1123   AST 18 01/31/2017 1225   ALT 33 03/13/2017 1123   ALT 29 01/31/2017 1225   ALKPHOS 61 03/13/2017 1123   ALKPHOS 66 01/31/2017 1225   BILITOT 0.5 03/13/2017 1123   BILITOT 0.57 01/31/2017 1225   GFRNONAA >60 03/13/2017 1123   GFRAA >60 03/13/2017 1123    No results found for: TOTALPROTELP, ALBUMINELP, A1GS, A2GS, BETS, BETA2SER, GAMS, MSPIKE, SPEI  No results found for: [facility]  Lab Results  Component Value Date   WBC 6.6 03/20/2017   NEUTROABS 4.1 03/20/2017   HGB 14.4 01/31/2017   HCT 38.6 03/20/2017   MCV 86.6 03/20/2017   PLT 373 03/20/2017      Chemistry      Component Value Date/Time   NA 135 (L) 03/13/2017 1123   NA 141 01/31/2017 1225   K 4.2 03/13/2017 1123   K 4.1 01/31/2017 1225   CL 102 03/13/2017 1123   CO2 24 03/13/2017 1123   CO2 29 01/31/2017 1225   BUN 17 03/13/2017 1123   BUN 17.4 01/31/2017 1225   CREATININE 0.83 03/13/2017 1123   CREATININE 0.8 01/31/2017 1225      Component Value Date/Time   CALCIUM 9.3 03/13/2017 1123   CALCIUM 10.0 01/31/2017 1225   ALKPHOS 61 03/13/2017 1123   ALKPHOS  66 01/31/2017 1225   AST 21 03/13/2017 1123   AST 18 01/31/2017 1225   ALT 33 03/13/2017 1123   ALT 29 01/31/2017 1225   BILITOT  0.5 03/13/2017 1123   BILITOT 0.57 01/31/2017 1225       No results found for: LABCA2  No components found for: POEUMP536  No results for input(s): INR in the last 168 hours.  No results found for: LABCA2  No results found for: RWE315  No results found for: QMG867  No results found for: YPP509  No results found for: CA2729  No components found for: HGQUANT  No results found for: CEA1 / No results found for: CEA1   No results found for: AFPTUMOR  No results found for: CHROMOGRNA  No results found for: PSA1  Appointment on 03/20/2017  Component Date Value Ref Range Status  . WBC Count 03/20/2017 6.6  3.9 - 10.3 K/uL Final  . RBC 03/20/2017 4.46  3.70 - 5.45 MIL/uL Final  . Hemoglobin 03/20/2017 13.2  11.6 - 15.9 g/dL Final  . HCT 03/20/2017 38.6  34.8 - 46.6 % Final  . MCV 03/20/2017 86.6  79.5 - 101.0 fL Final  . MCH 03/20/2017 29.5  25.1 - 34.0 pg Final  . MCHC 03/20/2017 34.0  31.5 - 36.0 g/dL Final  . RDW 03/20/2017 14.6* 11.2 - 14.5 % Final  . Platelet Count 03/20/2017 373  145 - 400 K/uL Final  . Neutrophils Relative % 03/20/2017 62  % Final  . Neutro Abs 03/20/2017 4.1  1.5 - 6.5 K/uL Final  . Lymphocytes Relative 03/20/2017 24  % Final  . Lymphs Abs 03/20/2017 1.6  0.9 - 3.3 K/uL Final  . Monocytes Relative 03/20/2017 10  % Final  . Monocytes Absolute 03/20/2017 0.6  0.1 - 0.9 K/uL Final  . Eosinophils Relative 03/20/2017 3  % Final  . Eosinophils Absolute 03/20/2017 0.2  0.0 - 0.5 K/uL Final  . Basophils Relative 03/20/2017 1  % Final  . Basophils Absolute 03/20/2017 0.1  0.0 - 0.1 K/uL Final   Performed at [facility], [place] [address] [place], [place] [address]    (this displays the last labs from the last 3 days)  No results found for: TOTALPROTELP, ALBUMINELP, A1GS, A2GS, BETS, BETA2SER, GAMS,  MSPIKE, SPEI (this displays SPEP labs)  No results found for: KPAFRELGTCHN, LAMBDASER, KAPLAMBRATIO (kappa/lambda light chains)  No results found for: HGBA, HGBA2QUANT, HGBFQUANT, HGBSQUAN (Hemoglobinopathy evaluation)   No results found for: LDH  No results found for: IRON, TIBC, IRONPCTSAT (Iron and TIBC)  No results found for: FERRITIN  Urinalysis No results found for: COLORURINE, APPEARANCEUR, LABSPEC, PHURINE, GLUCOSEU, HGBUR, BILIRUBINUR, KETONESUR, PROTEINUR, UROBILINOGEN, NITRITE, LEUKOCYTESUR   STUDIES: No results found.  Her last echo on 01/17/2017 showed an ejection fraction in the 65-70% range.  ELIGIBLE FOR AVAILABLE RESEARCH PROTOCOL: no  ASSESSMENT: 62 y.o. [place] woman status post right breast lower outer quadrant biopsy 12/15/2016 for a clinical T1b N0, stage IA invasive ductal carcinoma, grade 3, triple positive, with an MIB-1 of 15%  (1) Lumpectomy on 01/08/2017 that demonstrated IDC, grade 2, 1.1cm, 4 SLN negative, T1c, N0, stage IA  (2) adjuvant chemotherapy will consist of paclitaxel weekly x12, together with trastuzumab every 21 days to start on 01/31/2017  (a) Echocardiogram on 01/17/2017 demonstrates a LVEF of 65-70%  (3) trastuzumab to be continued to total 1 year   (4) adjuvant radiation planned  (5) antiestrogens to follow at the completion of local treatment  (6) genetics testing completed on 02/20/2017 offered through [facility] showed: no deleterious mutations. The following genes were evaluated for sequence changes and exonic deletions/duplications: APC, ATM, AXIN2, BAP1, BARD1, BMPR1A,  BRCA1, BRCA2, BRIP1, BUB1B, CDC73, CDH1, CDK4, CDKN1C, CDKN2A (p14ARF), CDKN2A (p16INK4a), CEP57, CHEK2, CTNNA1, DICER1, DIS3L2, EPCAM*, FH, FLCN, GPC3, GREM1*, KIT, MEN1, MET, MLH1, MSH2, MSH3, MSH6, MUTYH, NBN, NF1, PALB2, PDGFRA, PMS2, POLD1, POLE, PTEN, RAD50, RAD51C, RAD51D, SDHB,SDHC, SDHD, SMAD4, SMARCA4, SMARCB1, STK11, TP53, TSC1, TSC2, VHL, WT1. The  following genes were evaluated for sequence changes only: HOXB13*, MITF*, NTHL1*, SDHA. Results are negative unless otherwise indicated  PLAN: Katie Marquez is doing remarkably well with her chemotherapy.  She has not lost her hair.  She has had absolutely no peripheral neuropathy symptoms.  She has gained a little bit of weight.  This concerns her.  Today we talked about omitting carbohydrates.  She has some very relevant questions about dark wheat bread, brown rice instead of white rice, etc.  That is more than I know how to answer.  In general though if she eats fewer carbohydrates she should at least maintain her perhaps lose her weight.  For her nighttime hot flashes were going to try gabapentin.  Today we talked about the possible toxicity side effects and complications of this agent and she will let me know if she develops any problems  Otherwise she will see me again in 2 weeks.  I have asked her not to let herself get treated next week if she has the slightest concern regarding possible neuropathy.  She would like to start her radiations as soon as possible after completing chemotherapy.  I will alert Dr. Lanell Persons regarding that.   Magrinat, Virgie Dad, MD  03/20/17 10:00 AM Medical Oncology and Hematology [facility] [address] [place],  [address] Tel. [phone]    Fax. [phone]  This document serves as a record of services personally performed by Lurline Del, MD. It was created on his behalf by Sheron Nightingale, a trained medical scribe. The creation of this record is based on the scribe's personal observations and the provider's statements to them.   I have reviewed the above documentation for accuracy and completeness, and I agree with the above.

## 2017-03-20 ENCOUNTER — Inpatient Hospital Stay (HOSPITAL_BASED_OUTPATIENT_CLINIC_OR_DEPARTMENT_OTHER): Payer: 59 | Admitting: Oncology

## 2017-03-20 ENCOUNTER — Inpatient Hospital Stay: Payer: 59

## 2017-03-20 ENCOUNTER — Encounter: Payer: Self-pay | Admitting: Radiation Oncology

## 2017-03-20 VITALS — BP 145/90 | HR 71 | Temp 98.4°F | Resp 18 | Ht 62.0 in | Wt 118.7 lb

## 2017-03-20 DIAGNOSIS — F419 Anxiety disorder, unspecified: Secondary | ICD-10-CM | POA: Diagnosis not present

## 2017-03-20 DIAGNOSIS — Z79899 Other long term (current) drug therapy: Secondary | ICD-10-CM | POA: Diagnosis not present

## 2017-03-20 DIAGNOSIS — Z170 Estrogen receptor positive status [ER+]: Secondary | ICD-10-CM | POA: Diagnosis not present

## 2017-03-20 DIAGNOSIS — Z5111 Encounter for antineoplastic chemotherapy: Secondary | ICD-10-CM | POA: Diagnosis not present

## 2017-03-20 DIAGNOSIS — I10 Essential (primary) hypertension: Secondary | ICD-10-CM

## 2017-03-20 DIAGNOSIS — Z5112 Encounter for antineoplastic immunotherapy: Secondary | ICD-10-CM | POA: Diagnosis not present

## 2017-03-20 DIAGNOSIS — C50511 Malignant neoplasm of lower-outer quadrant of right female breast: Secondary | ICD-10-CM

## 2017-03-20 DIAGNOSIS — Z8051 Family history of malignant neoplasm of kidney: Secondary | ICD-10-CM

## 2017-03-20 DIAGNOSIS — M1990 Unspecified osteoarthritis, unspecified site: Secondary | ICD-10-CM | POA: Diagnosis not present

## 2017-03-20 DIAGNOSIS — R011 Cardiac murmur, unspecified: Secondary | ICD-10-CM

## 2017-03-20 DIAGNOSIS — K589 Irritable bowel syndrome without diarrhea: Secondary | ICD-10-CM | POA: Diagnosis not present

## 2017-03-20 DIAGNOSIS — R232 Flushing: Secondary | ICD-10-CM | POA: Diagnosis not present

## 2017-03-20 DIAGNOSIS — D509 Iron deficiency anemia, unspecified: Secondary | ICD-10-CM | POA: Diagnosis not present

## 2017-03-20 DIAGNOSIS — Z808 Family history of malignant neoplasm of other organs or systems: Secondary | ICD-10-CM

## 2017-03-20 LAB — CBC WITH DIFFERENTIAL (CANCER CENTER ONLY)
BASOS ABS: 0.1 10*3/uL (ref 0.0–0.1)
Basophils Relative: 1 %
EOS PCT: 3 %
Eosinophils Absolute: 0.2 10*3/uL (ref 0.0–0.5)
HCT: 38.6 % (ref 34.8–46.6)
Hemoglobin: 13.2 g/dL (ref 11.6–15.9)
LYMPHS PCT: 24 %
Lymphs Abs: 1.6 10*3/uL (ref 0.9–3.3)
MCH: 29.5 pg (ref 25.1–34.0)
MCHC: 34 g/dL (ref 31.5–36.0)
MCV: 86.6 fL (ref 79.5–101.0)
Monocytes Absolute: 0.6 10*3/uL (ref 0.1–0.9)
Monocytes Relative: 10 %
NEUTROS PCT: 62 %
Neutro Abs: 4.1 10*3/uL (ref 1.5–6.5)
PLATELETS: 373 10*3/uL (ref 145–400)
RBC: 4.46 MIL/uL (ref 3.70–5.45)
RDW: 14.6 % — ABNORMAL HIGH (ref 11.2–14.5)
WBC: 6.6 10*3/uL (ref 3.9–10.3)

## 2017-03-20 LAB — CMP (CANCER CENTER ONLY)
ALT: 29 U/L (ref 0–55)
AST: 20 U/L (ref 5–34)
Albumin: 3.6 g/dL (ref 3.5–5.0)
Alkaline Phosphatase: 57 U/L (ref 40–150)
Anion gap: 7 (ref 3–11)
BILIRUBIN TOTAL: 0.4 mg/dL (ref 0.2–1.2)
BUN: 18 mg/dL (ref 7–26)
CO2: 26 mmol/L (ref 22–29)
Calcium: 9.7 mg/dL (ref 8.4–10.4)
Chloride: 103 mmol/L (ref 98–109)
Creatinine: 0.81 mg/dL (ref 0.60–1.10)
Glucose, Bld: 83 mg/dL (ref 70–140)
POTASSIUM: 4.4 mmol/L (ref 3.5–5.1)
Sodium: 136 mmol/L (ref 136–145)
TOTAL PROTEIN: 6.7 g/dL (ref 6.4–8.3)

## 2017-03-20 MED ORDER — PROCHLORPERAZINE EDISYLATE 5 MG/ML IJ SOLN
INTRAMUSCULAR | Status: AC
  Filled 2017-03-20: qty 2

## 2017-03-20 MED ORDER — DEXAMETHASONE SODIUM PHOSPHATE 10 MG/ML IJ SOLN
INTRAMUSCULAR | Status: AC
  Filled 2017-03-20: qty 1

## 2017-03-20 MED ORDER — DIPHENHYDRAMINE HCL 50 MG/ML IJ SOLN
25.0000 mg | Freq: Once | INTRAMUSCULAR | Status: AC
  Administered 2017-03-20: 25 mg via INTRAVENOUS

## 2017-03-20 MED ORDER — PROCHLORPERAZINE EDISYLATE 5 MG/ML IJ SOLN
10.0000 mg | Freq: Once | INTRAMUSCULAR | Status: AC
  Administered 2017-03-20: 10 mg via INTRAVENOUS

## 2017-03-20 MED ORDER — SODIUM CHLORIDE 0.9 % IV SOLN
Freq: Once | INTRAVENOUS | Status: AC
  Administered 2017-03-20: 10:00:00 via INTRAVENOUS

## 2017-03-20 MED ORDER — HEPARIN SOD (PORK) LOCK FLUSH 100 UNIT/ML IV SOLN
500.0000 [IU] | Freq: Once | INTRAVENOUS | Status: AC | PRN
  Administered 2017-03-20: 500 [IU]
  Filled 2017-03-20: qty 5

## 2017-03-20 MED ORDER — SODIUM CHLORIDE 0.9 % IV SOLN
20.0000 mg | Freq: Once | INTRAVENOUS | Status: AC
  Administered 2017-03-20: 20 mg via INTRAVENOUS
  Filled 2017-03-20: qty 2

## 2017-03-20 MED ORDER — DEXAMETHASONE SODIUM PHOSPHATE 10 MG/ML IJ SOLN
4.0000 mg | Freq: Once | INTRAMUSCULAR | Status: AC
  Administered 2017-03-20: 4 mg via INTRAVENOUS

## 2017-03-20 MED ORDER — SODIUM CHLORIDE 0.9% FLUSH
10.0000 mL | INTRAVENOUS | Status: DC | PRN
  Administered 2017-03-20: 10 mL
  Filled 2017-03-20: qty 10

## 2017-03-20 MED ORDER — SODIUM CHLORIDE 0.9 % IV SOLN
80.0000 mg/m2 | Freq: Once | INTRAVENOUS | Status: AC
  Administered 2017-03-20: 120 mg via INTRAVENOUS
  Filled 2017-03-20: qty 20

## 2017-03-20 MED ORDER — DIPHENHYDRAMINE HCL 50 MG/ML IJ SOLN
INTRAMUSCULAR | Status: AC
  Filled 2017-03-20: qty 1

## 2017-03-20 MED ORDER — FAMOTIDINE IN NACL 20-0.9 MG/50ML-% IV SOLN: 20.0000 mg | Freq: Once | INTRAVENOUS | Status: DC

## 2017-03-20 MED ORDER — GABAPENTIN 300 MG PO CAPS: 300.0000 mg | ORAL_CAPSULE | Freq: Every day | ORAL | 4 refills | Status: DC

## 2017-03-20 NOTE — Patient Instructions (Signed)
[facility] Discharge Instructions for Patients Receiving Chemotherapy  Today you received the following chemotherapy agents:  Taxol.  To help prevent nausea and vomiting after your treatment, we encourage you to take your nausea medication as directed.   If you develop nausea and vomiting that is not controlled by your nausea medication, call the clinic.   BELOW ARE SYMPTOMS THAT SHOULD BE REPORTED IMMEDIATELY:  *FEVER GREATER THAN 100.5 F  *CHILLS WITH OR WITHOUT FEVER  NAUSEA AND VOMITING THAT IS NOT CONTROLLED WITH YOUR NAUSEA MEDICATION  *UNUSUAL SHORTNESS OF BREATH  *UNUSUAL BRUISING OR BLEEDING  TENDERNESS IN MOUTH AND THROAT WITH OR WITHOUT PRESENCE OF ULCERS  *URINARY PROBLEMS  *BOWEL PROBLEMS  UNUSUAL RASH Items with * indicate a potential emergency and should be followed up as soon as possible.  Feel free to call the clinic should you have any questions or concerns. The clinic phone number is [phone].  Please show the CHEMO ALERT CARD at check-in to the Emergency Department and triage nurse.   

## 2017-03-22 ENCOUNTER — Telehealth: Payer: Self-pay

## 2017-03-22 NOTE — Telephone Encounter (Signed)
Received VM from pt regarding having tingling in her right forefinger and knows she is to let her doctor know of any changes. Called pt and per Dr Jana Hakim, pt instructed to continue to watch for worsening of symptoms or any new symptoms and notify our office.  Also, pt instructed to come by Dr Magrinat's office after her lab appt on 03/27/2017 briefly to be seen by nurse/Dr Magrinat prior to infusion appt. Pt voiced understanding.

## 2017-03-23 ENCOUNTER — Other Ambulatory Visit: Payer: Self-pay

## 2017-03-23 ENCOUNTER — Encounter: Payer: Self-pay | Admitting: Physical Therapy

## 2017-03-23 ENCOUNTER — Ambulatory Visit: Payer: 59 | Attending: General Surgery | Admitting: Physical Therapy

## 2017-03-23 DIAGNOSIS — M6281 Muscle weakness (generalized): Secondary | ICD-10-CM | POA: Diagnosis present

## 2017-03-23 DIAGNOSIS — M25611 Stiffness of right shoulder, not elsewhere classified: Secondary | ICD-10-CM

## 2017-03-23 NOTE — Patient Instructions (Signed)
Copyright  VHI. All rights reserved.  Shoulder: Abduction (Supine)    With right arm flat on floor, hold dowel in palm. Slowly move arm up to side of head by pushing with opposite arm. Do not let elbow bend. Hold _10-15___ seconds. Repeat _10___ times. Do _2___ sessions per day. CAUTION: Stretch slowly and gently.  Copyright  VHI. All rights reserved.

## 2017-03-23 NOTE — Therapy (Signed)
[place], [place], [address] Phone: [phone]   Fax:  [phone]  Physical Therapy Evaluation  Patient Details  Name: Katie Marquez MRN: [id number] Date of Birth: 12-31-1955 Referring Provider: Dr. Dalbert Batman   Encounter Date: 03/23/2017  PT End of Session - 03/23/17 1155    Visit Number  1    Number of Visits  9    Date for PT Re-Evaluation  04/20/17    PT Start Time  1110    PT Stop Time  1148    PT Time Calculation (min)  38 min    Activity Tolerance  Patient tolerated treatment well    Behavior During Therapy  [facility] for tasks assessed/performed       Past Medical History:  Diagnosis Date  . Anxiety   . Cancer [facility])    breast cancer  . Diverticulosis   . Family history of kidney cancer   . Family history of thyroid cancer   . Heart murmur   . Hypertension   . IBS (irritable bowel syndrome)   . Iron deficiency   . Osteoarthritis   . PONV (postoperative nausea and vomiting)   . Tremors of nervous system    essential tremors    Past Surgical History:  Procedure Laterality Date  . BREAST LUMPECTOMY WITH RADIOACTIVE SEED AND SENTINEL LYMPH NODE BIOPSY Right 01/08/2017   Procedure: RIGHT BREAST LUMPECTOMY WITH RADIOACTIVE SEED AND RIGHT SENTINEL LYMPH NODE BIOPSY ERAS PATHWAY;  Surgeon: Fanny Skates, MD;  Location: [place];  Service: General;  Laterality: Right;  PEC BLOCK  . LAPAROSCOPIC SIGMOID COLECTOMY    . LAPAROSCOPY    . PORTACATH PLACEMENT Left 01/08/2017   Procedure: INSERTION PORT-A-CATH WITH ULTRA SOUND;  Surgeon: Fanny Skates, MD;  Location: [place];  Service: General;  Laterality: Left;    There were no vitals filed for this visit.   Subjective Assessment - 03/23/17 1116    Subjective  I am feeling tightness in my arm and it goes down to my wrist. It makes me want to keep my elbow bent. I am also having numbness in my axilla and some tingling in my second finger. I  think my scar is softening.     Pertinent History  01/08/17- R lumpectomy and SLNB (4 removed all negative), pt is currently taking chemo and herceptin, pt will require radiation once she completes radiation    Patient Stated Goals  get full range of motion back with no discomfort    Currently in Pain?  No/denies         [facility] PT Assessment - 03/23/17 0001      Assessment   Medical Diagnosis  right breast cancer    Referring Provider  Dr. Dalbert Batman    Onset Date/Surgical Date  01/08/17    Hand Dominance  Right    Prior Therapy  none      Precautions   Precautions  Other (comment)    Precaution Comments  at risk for lymphedema      Restrictions   Weight Bearing Restrictions  No      Balance Screen   Has the patient fallen in the past 6 months  No    Has the patient had a decrease in activity level because of a fear of falling?   No    Is the patient reluctant to leave their home because of a fear of falling?   No      Home Environment  Living Environment  Private residence    Living Arrangements  Spouse/significant other    Available Help at Discharge  Family    Type of [place] to enter    Entrance Stairs-Number of Steps  1    [place] to live on main level with bedroom/bathroom;Two level      Prior Function   Level of Independence  Independent    Vocation  Works at home    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    Leisure  prior to surgery: treadmill several times a week, had started swimming twice a week, yoga, since surgery: treadmill 2x/wk, swimming twice since surgery      Cognition   Overall Cognitive Status  Within Functional Limits for tasks assessed      ROM / Strength   AROM / PROM / Strength  AROM      AROM   AROM Assessment Site  Shoulder    Right/Left Shoulder  Right;Left    Right Shoulder Flexion  163 Degrees    Right Shoulder ABduction  177  Degrees    Right Shoulder Internal Rotation  72 Degrees    Right Shoulder External Rotation  90 Degrees    Left Shoulder Flexion  168 Degrees    Left Shoulder ABduction  179 Degrees    Left Shoulder Internal Rotation  67 Degrees    Left Shoulder External Rotation  90 Degrees        LYMPHEDEMA/ONCOLOGY QUESTIONNAIRE - 03/23/17 1131      Type   Cancer Type  right breast cancer      Surgeries   Lumpectomy Date  01/08/17    Sentinel Lymph Node Biopsy Date  01/08/17    Number Lymph Nodes Removed  4      Treatment   Active Chemotherapy Treatment  Yes    Past Chemotherapy Treatment  No    Active Radiation Treatment  -- will begin after radiation    Past Radiation Treatment  No      What other symptoms do you have   Are you Having Heaviness or Tightness  Yes    Are you having Pain  No    Are you having pitting edema  No    Is it Hard or Difficult finding clothes that fit  No    Do you have infections  No    Is there Decreased scar mobility  No      Lymphedema Assessments   Lymphedema Assessments  Upper extremities      Right Upper Extremity Lymphedema   15 cm Proximal to Olecranon Process  23 cm    Olecranon Process  20.8 cm    15 cm Proximal to Ulnar Styloid Process  21 cm    Just Proximal to Ulnar Styloid Process  14 cm    Across Hand at [employer]  17.7 cm    At [place] of 2nd Digit  5.6 cm      Left Upper Extremity Lymphedema   15 cm Proximal to Olecranon Process  24 cm    Olecranon Process  20.4 cm    15 cm Proximal to Ulnar Styloid Process  20.5 cm    Just Proximal to Ulnar Styloid Process  13.5 cm    Across Hand at [employer]  17.7 cm    At [place] of 2nd  Digit  5.7 cm        Quick Dash - 03/23/17 0001    Open a tight or new jar  No difficulty    Do heavy household chores (wash walls, wash floors)  Moderate difficulty    Carry a shopping bag or briefcase  No difficulty    Wash your back  No difficulty    Use a knife to cut food  No difficulty     Recreational activities in which you take some force or impact through your arm, shoulder, or hand (golf, hammering, tennis)  Moderate difficulty    During the past week, to what extent has your arm, shoulder or hand problem interfered with your normal social activities with family, friends, neighbors, or groups?  Not at all    During the past week, to what extent has your arm, shoulder or hand problem limited your work or other regular daily activities  Slightly    Arm, shoulder, or hand pain.  Mild    Tingling (pins and needles) in your arm, shoulder, or hand  None    Difficulty Sleeping  No difficulty    DASH Score  13.64 %       Objective measurements completed on examination: See above findings.              PT Education - 03/23/17 1201    Education provided  Yes    Education Details  anatomy and physiology of lymphatic system, lymphedema risk reduction practices, ABC class    Person(s) Educated  Patient    Methods  Explanation;Handout    Comprehension  Verbalized understanding          PT Long Term Goals - 03/23/17 1159      PT LONG TERM GOAL #1   Title  Pt will report 75% improvement in feelings of tightness and discomfort from cording in R axilla during RUE movement    Time  4    Period  Weeks    Status  New    Target Date  04/20/17      PT LONG TERM GOAL #2   Title  Pt will be independent in a home exercise program for continued strengthening and stretching    Time  4    Period  Weeks    Status  New    Target Date  04/20/17      PT LONG TERM GOAL #3   Title  Pt will report a 50% improvement in cording from right axilla to wrist to allow improved comfort.    Time  4    Period  Weeks    Status  New    Target Date  04/20/17             Plan - 03/23/17 1155    Clinical Impression Statement  Pt presents to PT with decreased right shoulder ROM when compared to the left side and tightness and discomfort with right UE movement secondary to axillary  cording. Pt underwent a right lumpectomy and SLNB for treatment of right breast cancer on 01/08/17. She is currently receiving chemotherapy and will begin radiation after she completes that. She has numerous cords present from axilla to wrist. She would benefit from skilled PT services to decrease cording in RUE, decrease tightness and improve ROM.     History and Personal Factors relevant to plan of care:  pt is right handed    Clinical Presentation  Evolving    Clinical Presentation due to:  pt currently receiving chemo and then will begin radiation which could make her tightness worse    Rehab Potential  Good    Clinical Impairments Affecting Rehab Potential  pt to begin radiation after chemo    PT Frequency  2x / week    PT Duration  4 weeks    PT Treatment/Interventions  ADLs/Self Care Home Management;Therapeutic exercise;Therapeutic activities;Patient/family education;Manual techniques;Manual lymph drainage;Scar mobilization;Taping    PT Next Visit Plan  make sure pt is signed up for abc class in March, begin gentle AA/A/PROM to R shoulder, myofascial and STM to cording in right axilla, give supine scap, TG soft to UE?    PT Home Exercise Plan  supine dowel exercises    Consulted and Agree with Plan of Care  Patient       Patient will benefit from skilled therapeutic intervention in order to improve the following deficits and impairments:  Increased fascial restricitons, Decreased range of motion, Decreased strength, Decreased knowledge of precautions, Postural dysfunction  Visit Diagnosis: Stiffness of right shoulder, not elsewhere classified - Plan: PT plan of care cert/re-cert  Muscle weakness (generalized) - Plan: PT plan of care cert/re-cert     Problem List Patient Active Problem List   Diagnosis Date Noted  . Genetic testing 03/08/2017  . Family history of kidney cancer   . Family history of thyroid cancer   . 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

## 2017-03-27 ENCOUNTER — Other Ambulatory Visit: Payer: Self-pay | Admitting: Oncology

## 2017-03-27 ENCOUNTER — Inpatient Hospital Stay: Payer: 59

## 2017-03-27 DIAGNOSIS — C50511 Malignant neoplasm of lower-outer quadrant of right female breast: Secondary | ICD-10-CM

## 2017-03-27 DIAGNOSIS — Z170 Estrogen receptor positive status [ER+]: Principal | ICD-10-CM

## 2017-03-27 LAB — CMP (CANCER CENTER ONLY)
ALBUMIN: 3.6 g/dL (ref 3.5–5.0)
ALT: 30 U/L (ref 0–55)
AST: 18 U/L (ref 5–34)
Alkaline Phosphatase: 59 U/L (ref 40–150)
Anion gap: 8 (ref 3–11)
BUN: 17 mg/dL (ref 7–26)
CO2: 27 mmol/L (ref 22–29)
Calcium: 9.8 mg/dL (ref 8.4–10.4)
Chloride: 102 mmol/L (ref 98–109)
Creatinine: 0.8 mg/dL (ref 0.60–1.10)
GFR, Est AFR Am: >60 mL/min (ref >60)
GFR, Estimated: >60 mL/min (ref >60)
GLUCOSE: 80 mg/dL (ref 70–140)
POTASSIUM: 4.7 mmol/L (ref 3.5–5.1)
SODIUM: 137 mmol/L (ref 136–145)
Total Bilirubin: 0.4 mg/dL (ref 0.2–1.2)
Total Protein: 6.7 g/dL (ref 6.4–8.3)

## 2017-03-27 LAB — CBC WITH DIFFERENTIAL (CANCER CENTER ONLY)
Basophils Absolute: 0 10*3/uL (ref 0.0–0.1)
Basophils Relative: 1 %
EOS PCT: 3 %
Eosinophils Absolute: 0.2 10*3/uL (ref 0.0–0.5)
HCT: 38.4 % (ref 34.8–46.6)
Hemoglobin: 13 g/dL (ref 11.6–15.9)
LYMPHS ABS: 1.6 10*3/uL (ref 0.9–3.3)
LYMPHS PCT: 26 %
MCH: 29.7 pg (ref 25.1–34.0)
MCHC: 33.9 g/dL (ref 31.5–36.0)
MCV: 87.9 fL (ref 79.5–101.0)
MONO ABS: 0.6 10*3/uL (ref 0.1–0.9)
Monocytes Relative: 10 %
NEUTROS ABS: 3.6 10*3/uL (ref 1.5–6.5)
Neutrophils Relative %: 60 %
PLATELETS: 340 10*3/uL (ref 145–400)
RBC: 4.37 MIL/uL (ref 3.70–5.45)
RDW: 14.7 % — AB (ref 11.2–14.5)
WBC Count: 6.1 10*3/uL (ref 3.9–10.3)

## 2017-03-27 MED ORDER — DIPHENHYDRAMINE HCL 50 MG/ML IJ SOLN
25.0000 mg | Freq: Once | INTRAMUSCULAR | Status: AC
  Administered 2017-03-27: 25 mg via INTRAVENOUS

## 2017-03-27 MED ORDER — HEPARIN SOD (PORK) LOCK FLUSH 100 UNIT/ML IV SOLN
500.0000 [IU] | Freq: Once | INTRAVENOUS | Status: AC | PRN
  Administered 2017-03-27: 500 [IU]
  Filled 2017-03-27: qty 5

## 2017-03-27 MED ORDER — DEXAMETHASONE SODIUM PHOSPHATE 10 MG/ML IJ SOLN
4.0000 mg | Freq: Once | INTRAMUSCULAR | Status: AC
  Administered 2017-03-27: 4 mg via INTRAVENOUS

## 2017-03-27 MED ORDER — PROCHLORPERAZINE EDISYLATE 5 MG/ML IJ SOLN
10.0000 mg | Freq: Once | INTRAMUSCULAR | Status: AC
  Administered 2017-03-27: 10 mg via INTRAVENOUS

## 2017-03-27 MED ORDER — SODIUM CHLORIDE 0.9 % IV SOLN
80.0000 mg/m2 | Freq: Once | INTRAVENOUS | Status: AC
  Administered 2017-03-27: 120 mg via INTRAVENOUS
  Filled 2017-03-27: qty 20

## 2017-03-27 MED ORDER — SODIUM CHLORIDE 0.9 % IV SOLN
20.0000 mg | Freq: Once | INTRAVENOUS | Status: AC
  Administered 2017-03-27: 20 mg via INTRAVENOUS
  Filled 2017-03-27: qty 2

## 2017-03-27 MED ORDER — DEXAMETHASONE SODIUM PHOSPHATE 10 MG/ML IJ SOLN
INTRAMUSCULAR | Status: AC
  Filled 2017-03-27: qty 1

## 2017-03-27 MED ORDER — SODIUM CHLORIDE 0.9% FLUSH
10.0000 mL | INTRAVENOUS | Status: DC | PRN
  Administered 2017-03-27: 10 mL
  Filled 2017-03-27: qty 10

## 2017-03-27 MED ORDER — FAMOTIDINE IN NACL 20-0.9 MG/50ML-% IV SOLN: 20.0000 mg | Freq: Once | INTRAVENOUS | Status: DC

## 2017-03-27 MED ORDER — PROCHLORPERAZINE EDISYLATE 5 MG/ML IJ SOLN
INTRAMUSCULAR | Status: AC
  Filled 2017-03-27: qty 2

## 2017-03-27 MED ORDER — DIPHENHYDRAMINE HCL 50 MG/ML IJ SOLN
INTRAMUSCULAR | Status: AC
  Filled 2017-03-27: qty 1

## 2017-03-27 MED ORDER — SODIUM CHLORIDE 0.9 % IV SOLN
Freq: Once | INTRAVENOUS | Status: AC
  Administered 2017-03-27: 12:00:00 via INTRAVENOUS

## 2017-03-27 NOTE — Patient Instructions (Signed)
[facility] Discharge Instructions for Patients Receiving Chemotherapy  Today you received the following chemotherapy agents: Paclitaxel (Taxol)  To help prevent nausea and vomiting after your treatment, we encourage you to take your nausea medication as prescribed. If you develop nausea and vomiting that is not controlled by your nausea medication, call the clinic.   BELOW ARE SYMPTOMS THAT SHOULD BE REPORTED IMMEDIATELY:  *FEVER GREATER THAN 100.5 F  *CHILLS WITH OR WITHOUT FEVER  NAUSEA AND VOMITING THAT IS NOT CONTROLLED WITH YOUR NAUSEA MEDICATION  *UNUSUAL SHORTNESS OF BREATH  *UNUSUAL BRUISING OR BLEEDING  TENDERNESS IN MOUTH AND THROAT WITH OR WITHOUT PRESENCE OF ULCERS  *URINARY PROBLEMS  *BOWEL PROBLEMS  UNUSUAL RASH Items with * indicate a potential emergency and should be followed up as soon as possible.  Feel free to call the clinic should you have any questions or concerns. The clinic phone number is [phone].  Please show the CHEMO ALERT CARD at check-in to the Emergency Department and triage nurse.   

## 2017-03-27 NOTE — Progress Notes (Unsigned)
Katie Marquez wanted to see me today before she got treated for 3 reasons.  First she had developed a little numbness and tingling in her right index finger.  She finally figured out this was related to her use of the mouse.  That has completely resolved.  A second problem is that she has been found to have quarts down her right arm and she is going to have physical therapy for that.  She has some exercises she is already started.  The third thing is that she passed out this past weekend.  She was sitting next to the fire had a little bit of alcohol got up to go to the bathroom because she was feeling a little funny passed out and then vomited.  She is on lisinopril 20 mg daily.  I explained that chemotherapy lowers the blood pressure, heat lowers her blood pressure, and alcohol also causes vasodilation.  This was just too much for her so she passed out.  She is going to stop the lisinopril.  She is going to check her blood pressure daily at home when she is relaxed and if it goes up to 145/90 or more she will let me know and I will put her on a lower dose of lisinopril but for now she will be off that medication  She is proceeding to treatment today

## 2017-03-28 ENCOUNTER — Ambulatory Visit: Payer: 59

## 2017-03-28 DIAGNOSIS — M25611 Stiffness of right shoulder, not elsewhere classified: Secondary | ICD-10-CM

## 2017-03-28 DIAGNOSIS — M6281 Muscle weakness (generalized): Secondary | ICD-10-CM

## 2017-03-28 NOTE — Patient Instructions (Signed)

## 2017-03-28 NOTE — Therapy (Signed)
[place], [place], [address] Phone: [phone]   Fax:  [phone]  Physical Therapy Treatment  Patient Details  Name: Katie Marquez MRN: [id number] Date of Birth: 1955-09-09 Referring Provider: Dr. Dalbert Batman   Encounter Date: 03/28/2017  PT End of Session - 03/28/17 1006    Visit Number  2    Number of Visits  9    Date for PT Re-Evaluation  04/20/17    PT Start Time  0941    PT Stop Time  1024    PT Time Calculation (min)  43 min    Activity Tolerance  Patient tolerated treatment well    Behavior During Therapy  [facility] for tasks assessed/performed       Past Medical History:  Diagnosis Date  . Anxiety   . Cancer [facility])    breast cancer  . Diverticulosis   . Family history of kidney cancer   . Family history of thyroid cancer   . Heart murmur   . Hypertension   . IBS (irritable bowel syndrome)   . Iron deficiency   . Osteoarthritis   . PONV (postoperative nausea and vomiting)   . Tremors of nervous system    essential tremors    Past Surgical History:  Procedure Laterality Date  . BREAST LUMPECTOMY WITH RADIOACTIVE SEED AND SENTINEL LYMPH NODE BIOPSY Right 01/08/2017   Procedure: RIGHT BREAST LUMPECTOMY WITH RADIOACTIVE SEED AND RIGHT SENTINEL LYMPH NODE BIOPSY ERAS PATHWAY;  Surgeon: Fanny Skates, MD;  Location: [place];  Service: General;  Laterality: Right;  PEC BLOCK  . LAPAROSCOPIC SIGMOID COLECTOMY    . LAPAROSCOPY    . PORTACATH PLACEMENT Left 01/08/2017   Procedure: INSERTION PORT-A-CATH WITH ULTRA SOUND;  Surgeon: Fanny Skates, MD;  Location: [place];  Service: General;  Laterality: Left;    There were no vitals filed for this visit.  Subjective Assessment - 03/28/17 0948    Subjective  My doctor took me off lisinipropil (BP med) for a brief time because over the weekend I passed out and then threw up on myself. He thinks my BP must have dropped. I've been doing the [employer] gave me last time and I want to review it.     Pertinent History  01/08/17- R lumpectomy and SLNB (4 removed all negative), pt is currently taking chemo and herceptin, pt will require radiation once she completes radiation    Patient Stated Goals  get full range of motion back with no discomfort    Currently in Pain?  No/denies                      [facility] Adult PT Treatment/Exercise - 03/28/17 0001      Shoulder Exercises: Supine   Horizontal ABduction  Strengthening;Both;10 reps;Theraband    Theraband Level (Shoulder Horizontal ABduction)  Level 1 (Yellow)    External Rotation  Strengthening;Both;10 reps;Theraband    Theraband Level (Shoulder External Rotation)  Level 1 (Yellow)    Flexion  Strengthening;Both;10 reps;Theraband Narrow and Wide Grip, 5 times each    Theraband Level (Shoulder Flexion)  Level 1 (Yellow)    Other Supine Exercises  Bil D2 with yellow theraband 10 times each side. Pt returning correct therapist demonstration       Shoulder Exercises: Pulleys   Flexion  Other (comment) Pt did not feel a stretch with this motion so stopped    ABduction  2 minutes    ABduction Limitations  VCs and demonstration for correct technique      Shoulder Exercises: Therapy Ball   Flexion  Other (comment) No stretch felt so stopped    ABduction  10 reps Rt UE with same side lean into end of stretch      Shoulder Exercises: ROM/Strengthening   Other ROM/Strengthening Exercises  Reviewed dowel exercises 5 times each into flexion and abduction      Shoulder Exercises: Stretch   Wall Stretch - Flexion  3 reps;10 seconds In doorway for pt to feel stretch at cord at anterior elbow      Manual Therapy   Manual Therapy  Myofascial release;Passive ROM    Myofascial Release  To Rt anterior elbow at cording; also instructed pt in this; pop felt by therapist and pt towards end of session then cording less palpable after    Passive ROM  In Supine into Rt D2 and abduction for  stretch to be felt for cording             PT Education - 03/28/17 1005    Education provided  Yes    Education Details  Supine scapular series; myofascial release for cording (no handout just return of demo)    Person(s) Educated  Patient    Methods  Explanation;Demonstration;Handout    Comprehension  Verbalized understanding;Returned demonstration          PT Long Term Goals - 03/23/17 1159      PT LONG TERM GOAL #1   Title  Pt will report 75% improvement in feelings of tightness and discomfort from cording in R axilla during RUE movement    Time  4    Period  Weeks    Status  New    Target Date  04/20/17      PT LONG TERM GOAL #2   Title  Pt will be independent in a home exercise program for continued strengthening and stretching    Time  4    Period  Weeks    Status  New    Target Date  04/20/17      PT LONG TERM GOAL #3   Title  Pt will report a 50% improvement in cording from right axilla to wrist to allow improved comfort.    Time  4    Period  Weeks    Status  New    Target Date  04/20/17            Plan - 03/28/17 1007    Clinical Impression Statement  Was able to show pt some good stretches she could continue for stretching and myofascial release to cording. Pt tolerated stretching very well and was very recepetive to learning how to further what she could do at home. Also tolerated supine scapular series well.     Rehab Potential  Good    Clinical Impairments Affecting Rehab Potential  pt to begin radiation after chemo    PT Frequency  2x / week    PT Duration  4 weeks    PT Treatment/Interventions  ADLs/Self Care Home Management;Therapeutic exercise;Therapeutic activities;Patient/family education;Manual techniques;Manual lymph drainage;Scar mobilization;Taping    PT Next Visit Plan  make sure pt is signed up for abc class in March, cont gentle AA/A/PROM to R shoulder, myofascial and STM to cording in right axilla, review supine scap, how was TG  soft to UE?    PT Home Exercise Plan  supine dowel exercises and supine scapular series    Consulted and Agree with  Plan of Care  Patient       Patient will benefit from skilled therapeutic intervention in order to improve the following deficits and impairments:  Increased fascial restricitons, Decreased range of motion, Decreased strength, Decreased knowledge of precautions, Postural dysfunction  Visit Diagnosis: Stiffness of right shoulder, not elsewhere classified  Muscle weakness (generalized)     Problem List Patient Active Problem List   Diagnosis Date Noted  . Genetic testing 03/08/2017  . Family history of kidney cancer   . Family history of thyroid cancer   . Carcinoma of lower-outer quadrant of right breast in female, estrogen receptor positive ([place]) 01/02/2017    Otelia Limes, PTA 03/28/2017, 10:29 AM  [place], [place], [address] Phone: [phone]   Fax:  [phone]  Name: Katie Marquez MRN: [id number] Date of Birth: Mar 08, 1955

## 2017-03-28 NOTE — Telephone Encounter (Signed)
No entry 

## 2017-03-30 ENCOUNTER — Telehealth: Payer: Self-pay | Admitting: *Deleted

## 2017-03-30 ENCOUNTER — Ambulatory Visit: Payer: 59 | Attending: General Surgery | Admitting: Physical Therapy

## 2017-03-30 ENCOUNTER — Encounter: Payer: Self-pay | Admitting: Physical Therapy

## 2017-03-30 DIAGNOSIS — M6281 Muscle weakness (generalized): Secondary | ICD-10-CM

## 2017-03-30 DIAGNOSIS — M25611 Stiffness of right shoulder, not elsewhere classified: Secondary | ICD-10-CM

## 2017-03-30 MED ORDER — LISINOPRIL 10 MG PO TABS: 10.0000 mg | ORAL_TABLET | Freq: Every day | ORAL | 3 refills | Status: DC

## 2017-03-30 NOTE — Telephone Encounter (Signed)
This RN spoke with pt who stated since stopping BP medication she has been monitoring - with an average reading of 146/96.  Pt was previously on 20 mg daily.  Per MD review - recommended for pt to resume lisinopril at 10 mg daily.  Above discussed with pt and new prescription sent to verified pharmacy.

## 2017-03-30 NOTE — Therapy (Signed)
[place], [place], [address] Phone: [phone]   Fax:  [phone]  Physical Therapy Treatment  Patient Details  Name: Katie Marquez MRN: [id number] Date of Birth: Feb 06, 1955 Referring Provider: Dr. Dalbert Batman   Encounter Date: 03/30/2017  PT End of Session - 03/30/17 1208    Visit Number  3    Number of Visits  9    Date for PT Re-Evaluation  04/20/17    PT Start Time  1019    PT Stop Time  1102    PT Time Calculation (min)  43 min    Activity Tolerance  Patient tolerated treatment well    Behavior During Therapy  [facility] for tasks assessed/performed       Past Medical History:  Diagnosis Date  . Anxiety   . Cancer [facility])    breast cancer  . Diverticulosis   . Family history of kidney cancer   . Family history of thyroid cancer   . Heart murmur   . Hypertension   . IBS (irritable bowel syndrome)   . Iron deficiency   . Osteoarthritis   . PONV (postoperative nausea and vomiting)   . Tremors of nervous system    essential tremors    Past Surgical History:  Procedure Laterality Date  . BREAST LUMPECTOMY WITH RADIOACTIVE SEED AND SENTINEL LYMPH NODE BIOPSY Right 01/08/2017   Procedure: RIGHT BREAST LUMPECTOMY WITH RADIOACTIVE SEED AND RIGHT SENTINEL LYMPH NODE BIOPSY ERAS PATHWAY;  Surgeon: Fanny Skates, MD;  Location: [place];  Service: General;  Laterality: Right;  PEC BLOCK  . LAPAROSCOPIC SIGMOID COLECTOMY    . LAPAROSCOPY    . PORTACATH PLACEMENT Left 01/08/2017   Procedure: INSERTION PORT-A-CATH WITH ULTRA SOUND;  Surgeon: Fanny Skates, MD;  Location: [place];  Service: General;  Laterality: Left;    There were no vitals filed for this visit.  Subjective Assessment - 03/30/17 1020    Subjective  I am feeling good. I was here Wednesday and the massaging she did made a huge difference but now it has tightened back up. I tried to do the exercises yesterday and I am not sure if I  remember how to do them all correctly.     Pertinent History  01/08/17- R lumpectomy and SLNB (4 removed all negative), pt is currently taking chemo and herceptin, pt will require radiation once she completes radiation    Patient Stated Goals  get full range of motion back with no discomfort    Currently in Pain?  No/denies                      [facility] Adult PT Treatment/Exercise - 03/30/17 0001      Shoulder Exercises: Supine   Horizontal ABduction  Strengthening;Both;10 reps;Theraband    Theraband Level (Shoulder Horizontal ABduction)  Level 1 (Yellow)    External Rotation  Strengthening;Both;10 reps;Theraband    Theraband Level (Shoulder External Rotation)  Level 1 (Yellow)    Flexion  Strengthening;Both;10 reps;Theraband Narrow and Wide Grip, 10 times each    Theraband Level (Shoulder Flexion)  Level 1 (Yellow)    Other Supine Exercises  Bil D2 with yellow theraband 10 times each side. Pt returning correct demonstration      Shoulder Exercises: Pulleys   ABduction  2 minutes      Shoulder Exercises: Therapy Ball   ABduction  10 reps Rt UE with same side lean into end of stretch  Manual Therapy   Manual Therapy  Myofascial release;Passive ROM    Myofascial Release  To Rt anterior elbow at cording and to R axilla in area of cording -  no pops felt but cording much less visible and palpable at end of session    Passive ROM  In Supine into abduction for stretch to be felt for cording                  PT Long Term Goals - 03/23/17 1159      PT LONG TERM GOAL #1   Title  Pt will report 75% improvement in feelings of tightness and discomfort from cording in R axilla during RUE movement    Time  4    Period  Weeks    Status  New    Target Date  04/20/17      PT LONG TERM GOAL #2   Title  Pt will be independent in a home exercise program for continued strengthening and stretching    Time  4    Period  Weeks    Status  New    Target Date  04/20/17       PT LONG TERM GOAL #3   Title  Pt will report a 50% improvement in cording from right axilla to wrist to allow improved comfort.    Time  4    Period  Weeks    Status  New    Target Date  04/20/17            Plan - 03/30/17 1208    Clinical Impression Statement  Re educated pt in supine scapular exericses and issued verbal and tactile cues for external rotation as pt had not been performing this one correctly. Continued myofascial work and PROM to right shoulder and while no cords released they were much less visible and palpable at end of session.     Rehab Potential  Good    Clinical Impairments Affecting Rehab Potential  pt to begin radiation after chemo    PT Frequency  2x / week    PT Duration  4 weeks    PT Treatment/Interventions  ADLs/Self Care Home Management;Therapeutic exercise;Therapeutic activities;Patient/family education;Manual techniques;Manual lymph drainage;Scar mobilization;Taping    PT Next Visit Plan  make sure pt is signed up for abc class in March, cont gentle AA/A/PROM to R shoulder, myofascial and STM to cording in right axilla, review supine scap, how was TG soft to UE?    PT Home Exercise Plan  supine dowel exercises and supine scapular series    Consulted and Agree with Plan of Care  Patient       Patient will benefit from skilled therapeutic intervention in order to improve the following deficits and impairments:  Increased fascial restricitons, Decreased range of motion, Decreased strength, Decreased knowledge of precautions, Postural dysfunction  Visit Diagnosis: Stiffness of right shoulder, not elsewhere classified  Muscle weakness (generalized)     Problem List Patient Active Problem List   Diagnosis Date Noted  . Genetic testing 03/08/2017  . Family history of kidney cancer   . Family history of thyroid cancer   . 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

## 2017-04-02 NOTE — Progress Notes (Signed)
[place]  [phone] [phone]     ID: Katie Marquez DOB: 1955-08-28  MR#: [id number]  TXM#:[id number]  Patient Care Team: Jonathon Jordan, MD as PCP - General (Family Medicine) Fanny Skates, MD as Consulting Physician (General Surgery) Kirklin Mcduffee, Virgie Dad, MD as Consulting Physician (Oncology) Richmond Campbell, MD as Consulting Physician (Gastroenterology) Eppie Gibson, MD as Attending Physician (Radiation Oncology) Lovey Newcomer, MD as Attending Physician (Radiology) Bensimhon, Shaune Pascal, MD as Consulting Physician (Cardiology) Molli Posey, MD as Consulting Physician (Obstetrics and Gynecology) OTHER MD:  CHIEF COMPLAINT: Triple positive breast cancer  CURRENT TREATMENT: Paclitaxel and Trastuzumab   HISTORY OF CURRENT ILLNESS: From the original intake note:  Katie Marquez had bilateral screening mammography with tomography at the breast center 12/12/2016, showing a possible mass in the right breast.  Right breast diagnostic mammography with tomography and right breast ultrasonography 12/15/2016 found the breast density to be category D.  In the lower right breast there was a small lobulated mass which was not palpable.  Ultrasound showed a 0.8 cm mass in the right breast 7:00 radiant 2 cm from the nipple.  The right axilla was benign.  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  The patient's subsequent history is as detailed below.  INTERVAL HISTORY: [name] returns today for a follow-up of her triple positive breast cancer accompanied by her husband. She continues on weekly paclitaxel, with today being day 1, cycle 10 of 12 planned cycles. She tolerates this  well.  She also receives trastuzumab every 21 days, with her most recent dose 03/13/2017. She is due for a dose today. She tolerates this well.   She has had absolutely no peripheral neuropathy symptoms to date  After her fainting episode we took her off her blood pressure medicine, however her blood pressure quickly rose again to uncomfortable levels and we are resuming her blood pressure medicines as previously.   REVIEW OF SYSTEMS: [name] reports that she hasn't had any fainting spells since her last visit to the office. She reports that her blood pressure is still high at 147/97 typically even with the blood pressure medications. Her blood pressure in the office today is at 143/91. She reports that her hx of essential tremors have mildly worsened. She has had a slight increase of fatigue and she goes to bed at 8:30 PM and wakes at 8 AM. For exercise, she is using the treadmill at this time. She denies neuropathy at this time. She does note a mouth sore at this time. She denies unusual headaches, visual changes, nausea, vomiting, or dizziness. There has been no unusual cough, phlegm production, or pleurisy. This been no change in bowel or bladder habits. She denies unexplained fatigue or unexplained weight loss, bleeding, rash, or fever. A detailed review of systems was otherwise stable.      PAST MEDICAL HISTORY: Past Medical History:  Diagnosis Date  . Anxiety   . Cancer [facility])    breast cancer  . Diverticulosis   . Family history of kidney cancer   . Family history of thyroid cancer   . Heart murmur   . Hypertension   . IBS (irritable bowel syndrome)   . Iron deficiency   . Osteoarthritis   . PONV (  postoperative nausea and vomiting)   . Tremors of nervous system    essential tremors    PAST SURGICAL HISTORY: Past Surgical History:  Procedure Laterality Date  . BREAST LUMPECTOMY WITH RADIOACTIVE SEED AND SENTINEL LYMPH NODE BIOPSY Right 01/08/2017   Procedure: RIGHT BREAST  LUMPECTOMY WITH RADIOACTIVE SEED AND RIGHT SENTINEL LYMPH NODE BIOPSY ERAS PATHWAY;  Surgeon: Fanny Skates, MD;  Location: [place];  Service: General;  Laterality: Right;  PEC BLOCK  . LAPAROSCOPIC SIGMOID COLECTOMY    . LAPAROSCOPY    . PORTACATH PLACEMENT Left 01/08/2017   Procedure: INSERTION PORT-A-CATH WITH ULTRA SOUND;  Surgeon: Fanny Skates, MD;  Location: [facility];  Service: General;  Laterality: Left;    FAMILY HISTORY Family History  Problem Relation Age of Onset  . Rheum arthritis Mother   . Hyperlipidemia Mother   . Hypertension Mother   . Thyroid cancer Mother 85  . Diverticulitis Father   . Parkinson's disease Father   . Kidney cancer Father 83  . Multiple sclerosis Maternal Aunt   . Breast cancer Neg Hx   Katie Marquez comes from an [place] background.  Her father died at 58 with Parkinson's disease and complications from tobacco abuse including a history of renal cancer; the patient's mother died at age 67, with a history of rheumatoid arthritis and thyroid cancer.  The patient had no brothers, 1 sister, in good health.  There is no history of breast or ovarian cancer in the family  GYNECOLOGIC HISTORY:  No LMP recorded. Patient is postmenopausal. Menarche age 41, first live birth age 47.  The patient is GX P1.  She had infertility treatments after her first live birth but they were not successful.  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  Their daughter [name] lives in [place].  She attended [employer] and then [employer] in business and psychology.  She is planning to get married in September 2019.  Her fiance is in finance.     ADVANCED DIRECTIVES:    HEALTH MAINTENANCE: Social History   Tobacco Use  . Smoking status: Never Smoker  . Smokeless tobacco: Never Used    Substance Use Topics  . Alcohol use: Yes    Frequency: Never    Comment: occas  . Drug use: No     Colonoscopy: [facility]  PAP: [place]  Bone density: Osteopenia, remote test   No Known Allergies  Current Outpatient Medications  Medication Sig Dispense Refill  . CALCIUM PO Take by mouth daily.    . cholecalciferol (VITAMIN D) 1000 units tablet Take 2,000 Units by mouth daily.    Marland Kitchen FLUoxetine (PROZAC) 10 MG capsule Take 20 mg by mouth daily.     Marland Kitchen gabapentin (NEURONTIN) 300 MG capsule Take 1 capsule (300 mg total) by mouth at bedtime. 90 capsule 4  . lidocaine-prilocaine (EMLA) cream Apply to affected area once 30 g 3  . lisinopril (PRINIVIL,ZESTRIL) 10 MG tablet Take 1 tablet (10 mg total) by mouth daily. 30 tablet 3  . LORazepam (ATIVAN) 0.5 MG tablet Take 1 tablet (0.5 mg total) by mouth every 6 (six) hours as needed (Nausea or vomiting). 30 tablet 0  . ondansetron (ZOFRAN) 8 MG tablet Take 1 tablet (8 mg total) by mouth 2 (two) times daily as needed (Nausea or vomiting). 30 tablet  1  . Probiotic Product (PROBIOTIC-10 PO) Take by mouth.    . prochlorperazine (COMPAZINE) 10 MG tablet Take 1 tablet (10 mg total) by mouth every 6 (six) hours as needed (Nausea or vomiting). 30 tablet 1  . Red Yeast Rice 600 MG CAPS Take by mouth.    . valACYclovir (VALTREX) 1000 MG tablet Take 1 tablet (1,000 mg total) by mouth daily. 30 tablet 0   No current facility-administered medications for this visit.     OBJECTIVE: Middle-aged white woman appears well  Vitals:   04/03/17 1030  BP: (!) 143/91  Pulse: 85  Resp: 18  Temp: 98.3 F (36.8 C)  SpO2: 99%     Body mass index is 21.78 kg/m.   Wt Readings from Last 3 Encounters:  04/03/17 119 lb 1.6 oz (54 kg)  03/20/17 118 lb 11.2 oz (53.8 kg)  03/13/17 117 lb (53.1 kg)      ECOG FS:0 - Asymptomatic   Sclerae unicteric, pupils round and equal Oropharynx shows a very minimal erosion in the left cheek area No cervical or supraclavicular  adenopathy Lungs no rales or rhonchi Heart regular rate and rhythm Abd soft, nontender, positive bowel sounds MSK no focal spinal tenderness, no upper extremity lymphedema Neuro: nonfocal, well oriented, appropriate affect Breasts: The right breast is status post lumpectomy.  There is no evidence of local recurrence per the left breast is benign.  Both axillae are benign.  LAB RESULTS:  CMP     Component Value Date/Time   NA 137 03/27/2017 1022   NA 141 01/31/2017 1225   K 4.7 03/27/2017 1022   K 4.1 01/31/2017 1225   CL 102 03/27/2017 1022   CO2 27 03/27/2017 1022   CO2 29 01/31/2017 1225   GLUCOSE 80 03/27/2017 1022   GLUCOSE 86 01/31/2017 1225   BUN 17 03/27/2017 1022   BUN 17.4 01/31/2017 1225   CREATININE 0.80 03/27/2017 1022   CREATININE 0.8 01/31/2017 1225   CALCIUM 9.8 03/27/2017 1022   CALCIUM 10.0 01/31/2017 1225   PROT 6.7 03/27/2017 1022   PROT 7.2 01/31/2017 1225   ALBUMIN 3.6 03/27/2017 1022   ALBUMIN 4.0 01/31/2017 1225   AST 18 03/27/2017 1022   AST 18 01/31/2017 1225   ALT 30 03/27/2017 1022   ALT 29 01/31/2017 1225   ALKPHOS 59 03/27/2017 1022   ALKPHOS 66 01/31/2017 1225   BILITOT 0.4 03/27/2017 1022   BILITOT 0.57 01/31/2017 1225   GFRNONAA >60 03/27/2017 1022   GFRAA >60 03/27/2017 1022    No results found for: TOTALPROTELP, ALBUMINELP, A1GS, A2GS, BETS, BETA2SER, GAMS, MSPIKE, SPEI  No results found for: KPAFRELGTCHN, LAMBDASER, [facility]  Lab Results  Component Value Date   WBC 6.1 04/03/2017   NEUTROABS 3.6 04/03/2017   HGB 14.4 01/31/2017   HCT 39.3 04/03/2017   MCV 87.2 04/03/2017   PLT 383 04/03/2017      Chemistry      Component Value Date/Time   NA 137 03/27/2017 1022   NA 141 01/31/2017 1225   K 4.7 03/27/2017 1022   K 4.1 01/31/2017 1225   CL 102 03/27/2017 1022   CO2 27 03/27/2017 1022   CO2 29 01/31/2017 1225   BUN 17 03/27/2017 1022   BUN 17.4 01/31/2017 1225   CREATININE 0.80 03/27/2017 1022   CREATININE 0.8  01/31/2017 1225      Component Value Date/Time   CALCIUM 9.8 03/27/2017 1022   CALCIUM 10.0 01/31/2017 1225   ALKPHOS 59 03/27/2017  1022   ALKPHOS 66 01/31/2017 1225   AST 18 03/27/2017 1022   AST 18 01/31/2017 1225   ALT 30 03/27/2017 1022   ALT 29 01/31/2017 1225   BILITOT 0.4 03/27/2017 1022   BILITOT 0.57 01/31/2017 1225       No results found for: LABCA2  No components found for: YYTKPT465  No results for input(s): INR in the last 168 hours.  No results found for: LABCA2  No results found for: KCL275  No results found for: TZG017  No results found for: CBS496  No results found for: CA2729  No components found for: HGQUANT  No results found for: CEA1 / No results found for: CEA1   No results found for: AFPTUMOR  No results found for: CHROMOGRNA  No results found for: PSA1  Appointment on 04/03/2017  Component Date Value Ref Range Status  . WBC Count 04/03/2017 6.1  3.9 - 10.3 K/uL Final  . RBC 04/03/2017 4.51  3.70 - 5.45 MIL/uL Final  . Hemoglobin 04/03/2017 13.2  11.6 - 15.9 g/dL Final  . HCT 04/03/2017 39.3  34.8 - 46.6 % Final  . MCV 04/03/2017 87.2  79.5 - 101.0 fL Final  . MCH 04/03/2017 29.2  25.1 - 34.0 pg Final  . MCHC 04/03/2017 33.5  31.5 - 36.0 g/dL Final  . RDW 04/03/2017 15.0* 11.2 - 14.5 % Final  . Platelet Count 04/03/2017 383  145 - 400 K/uL Final  . Neutrophils Relative % 04/03/2017 59  % Final  . Neutro Abs 04/03/2017 3.6  1.5 - 6.5 K/uL Final  . Lymphocytes Relative 04/03/2017 26  % Final  . Lymphs Abs 04/03/2017 1.6  0.9 - 3.3 K/uL Final  . Monocytes Relative 04/03/2017 10  % Final  . Monocytes Absolute 04/03/2017 0.6  0.1 - 0.9 K/uL Final  . Eosinophils Relative 04/03/2017 4  % Final  . Eosinophils Absolute 04/03/2017 0.2  0.0 - 0.5 K/uL Final  . Basophils Relative 04/03/2017 1  % Final  . Basophils Absolute 04/03/2017 0.1  0.0 - 0.1 K/uL Final   Performed at [facility], [place] [address],  [place], [place] [address]    (this displays the last labs from the last 3 days)  No results found for: TOTALPROTELP, ALBUMINELP, A1GS, A2GS, BETS, BETA2SER, GAMS, MSPIKE, SPEI (this displays SPEP labs)  No results found for: KPAFRELGTCHN, LAMBDASER, KAPLAMBRATIO (kappa/lambda light chains)  No results found for: HGBA, HGBA2QUANT, HGBFQUANT, HGBSQUAN (Hemoglobinopathy evaluation)   No results found for: LDH  No results found for: IRON, TIBC, IRONPCTSAT (Iron and TIBC)  No results found for: FERRITIN  Urinalysis No results found for: COLORURINE, APPEARANCEUR, LABSPEC, PHURINE, GLUCOSEU, HGBUR, BILIRUBINUR, KETONESUR, PROTEINUR, UROBILINOGEN, NITRITE, LEUKOCYTESUR   STUDIES: No results found.   ELIGIBLE FOR AVAILABLE RESEARCH PROTOCOL: no  ASSESSMENT: 62 y.o. [place] woman status post right breast lower outer quadrant biopsy 12/15/2016 for a clinical T1b N0, stage IA invasive ductal carcinoma, grade 3, triple positive, with an MIB-1 of 15%  (1) Lumpectomy on 01/08/2017 that demonstrated IDC, grade 2, 1.1cm, 4 SLN negative, T1c, N0, stage IA  (2) adjuvant chemotherapy will consist of paclitaxel weekly x12, together with trastuzumab every 21 days to start on 01/31/2017  (a) Echocardiogram on 01/17/2017 demonstrates a LVEF of 65-70%  (3) trastuzumab to be continued to total 1 year   (4) adjuvant radiation planned  (5) antiestrogens to follow at the completion of local treatment  (6) genetics testing completed on 02/20/2017 offered through [facility]  showed: no deleterious mutations. The following genes were evaluated for sequence changes and exonic deletions/duplications: APC, ATM, AXIN2, BAP1, BARD1, BMPR1A, BRCA1, BRCA2, BRIP1, BUB1B, CDC73, CDH1, CDK4, CDKN1C, CDKN2A (p14ARF), CDKN2A (p16INK4a), CEP57, CHEK2, CTNNA1, DICER1, DIS3L2, EPCAM*, FH, FLCN, GPC3, GREM1*, KIT, MEN1, MET, MLH1, MSH2, MSH3, MSH6, MUTYH, NBN, NF1, PALB2, PDGFRA, PMS2, POLD1, POLE, PTEN, RAD50, RAD51C, RAD51D,  SDHB,SDHC, SDHD, SMAD4, SMARCA4, SMARCB1, STK11, TP53, TSC1, TSC2, VHL, WT1. The following genes were evaluated for sequence changes only: HOXB13*, MITF*, NTHL1*, SDHA. Results are negative unless otherwise indicated  PLAN: Katie Marquez continues to tolerate her chemotherapy and anti-HER-2 immunotherapy remarkably well.  She is proceeding to cycle #10 today.  She will return next week for the 11th, assuming there is no contraindication (she is very aware of the issue regarding peripheral neuropathy), and then she will see me with her final cycle of Taxol on 04/17/2017  She will continue the trastuzumab all the way through December.  We are scheduling those appointments today  She will be seeing Dr. Isidore Moos later this month to schedule the radiation treatments  I reassured her that her immune system while not perfect at present is good enough that she should feel comfortable going out to eat or seeing a movie.  She just needs to make sure to keep her hands washed and to avoid anybody who is coughing or otherwise sick  I started her on valacyclovir for her very small mouth sore, and she will continue on this medication through her chemo treatments +1-week  She is benefiting from physical therapy but still feels a "cord" down her right arm  She knows to call for any issues that may develop before her next visit.  Dashawn Bartnick, Virgie Dad, MD  04/03/17 10:51 AM Medical Oncology and Hematology [facility] [address] [place], [place] [address] Tel. [phone]    Fax. [phone]  This document serves as a record of services personally performed by Lurline Del, MD. It was created on his behalf by Steva Colder, a trained medical scribe. The creation of this record is based on the scribe's personal observations and the provider's statements to them.   I have reviewed the above documentation for accuracy and completeness, and I agree with the above.

## 2017-04-03 ENCOUNTER — Inpatient Hospital Stay (HOSPITAL_BASED_OUTPATIENT_CLINIC_OR_DEPARTMENT_OTHER): Payer: 59 | Admitting: Oncology

## 2017-04-03 ENCOUNTER — Telehealth: Payer: Self-pay | Admitting: Oncology

## 2017-04-03 ENCOUNTER — Inpatient Hospital Stay: Payer: 59

## 2017-04-03 ENCOUNTER — Other Ambulatory Visit: Payer: Self-pay | Admitting: Oncology

## 2017-04-03 ENCOUNTER — Inpatient Hospital Stay: Payer: 59 | Attending: Adult Health

## 2017-04-03 VITALS — BP 143/91 | HR 85 | Temp 98.3°F | Resp 18 | Ht 62.0 in | Wt 119.1 lb

## 2017-04-03 DIAGNOSIS — Z8051 Family history of malignant neoplasm of kidney: Secondary | ICD-10-CM

## 2017-04-03 DIAGNOSIS — C50511 Malignant neoplasm of lower-outer quadrant of right female breast: Secondary | ICD-10-CM | POA: Diagnosis not present

## 2017-04-03 DIAGNOSIS — M7989 Other specified soft tissue disorders: Secondary | ICD-10-CM | POA: Insufficient documentation

## 2017-04-03 DIAGNOSIS — R21 Rash and other nonspecific skin eruption: Secondary | ICD-10-CM | POA: Insufficient documentation

## 2017-04-03 DIAGNOSIS — Z5111 Encounter for antineoplastic chemotherapy: Secondary | ICD-10-CM | POA: Insufficient documentation

## 2017-04-03 DIAGNOSIS — Z5112 Encounter for antineoplastic immunotherapy: Secondary | ICD-10-CM | POA: Insufficient documentation

## 2017-04-03 DIAGNOSIS — R251 Tremor, unspecified: Secondary | ICD-10-CM

## 2017-04-03 DIAGNOSIS — K1379 Other lesions of oral mucosa: Secondary | ICD-10-CM

## 2017-04-03 DIAGNOSIS — Z79899 Other long term (current) drug therapy: Secondary | ICD-10-CM

## 2017-04-03 DIAGNOSIS — Z8619 Personal history of other infectious and parasitic diseases: Secondary | ICD-10-CM | POA: Diagnosis not present

## 2017-04-03 DIAGNOSIS — R419 Unspecified symptoms and signs involving cognitive functions and awareness: Secondary | ICD-10-CM | POA: Insufficient documentation

## 2017-04-03 DIAGNOSIS — M1990 Unspecified osteoarthritis, unspecified site: Secondary | ICD-10-CM | POA: Diagnosis not present

## 2017-04-03 DIAGNOSIS — Z170 Estrogen receptor positive status [ER+]: Secondary | ICD-10-CM | POA: Insufficient documentation

## 2017-04-03 DIAGNOSIS — Z808 Family history of malignant neoplasm of other organs or systems: Secondary | ICD-10-CM

## 2017-04-03 LAB — CBC WITH DIFFERENTIAL (CANCER CENTER ONLY)
BASOS ABS: 0.1 10*3/uL (ref 0.0–0.1)
BASOS PCT: 1 %
EOS PCT: 4 %
Eosinophils Absolute: 0.2 10*3/uL (ref 0.0–0.5)
HCT: 39.3 % (ref 34.8–46.6)
Hemoglobin: 13.2 g/dL (ref 11.6–15.9)
LYMPHS PCT: 26 %
Lymphs Abs: 1.6 10*3/uL (ref 0.9–3.3)
MCH: 29.2 pg (ref 25.1–34.0)
MCHC: 33.5 g/dL (ref 31.5–36.0)
MCV: 87.2 fL (ref 79.5–101.0)
MONO ABS: 0.6 10*3/uL (ref 0.1–0.9)
Monocytes Relative: 10 %
NEUTROS ABS: 3.6 10*3/uL (ref 1.5–6.5)
Neutrophils Relative %: 59 %
PLATELETS: 383 10*3/uL (ref 145–400)
RBC: 4.51 MIL/uL (ref 3.70–5.45)
RDW: 15 % — AB (ref 11.2–14.5)
WBC: 6.1 10*3/uL (ref 3.9–10.3)

## 2017-04-03 LAB — CMP (CANCER CENTER ONLY)
ALBUMIN: 3.7 g/dL (ref 3.5–5.0)
ALT: 33 U/L (ref 0–55)
AST: 24 U/L (ref 5–34)
Alkaline Phosphatase: 60 U/L (ref 40–150)
Anion gap: 8 (ref 3–11)
BUN: 18 mg/dL (ref 7–26)
CHLORIDE: 103 mmol/L (ref 98–109)
CO2: 26 mmol/L (ref 22–29)
Calcium: 10.1 mg/dL (ref 8.4–10.4)
Creatinine: 0.83 mg/dL (ref 0.60–1.10)
GFR, Est AFR Am: >60 mL/min (ref >60)
GFR, Estimated: >60 mL/min (ref >60)
GLUCOSE: 93 mg/dL (ref 70–140)
POTASSIUM: 4.4 mmol/L (ref 3.5–5.1)
Sodium: 137 mmol/L (ref 136–145)
Total Bilirubin: 0.4 mg/dL (ref 0.2–1.2)
Total Protein: 6.9 g/dL (ref 6.4–8.3)

## 2017-04-03 MED ORDER — DIPHENHYDRAMINE HCL 50 MG/ML IJ SOLN
INTRAMUSCULAR | Status: AC
  Filled 2017-04-03: qty 1

## 2017-04-03 MED ORDER — DIPHENHYDRAMINE HCL 25 MG PO CAPS
25.0000 mg | ORAL_CAPSULE | Freq: Once | ORAL | Status: AC
Start: 2017-04-03 — End: 2017-04-03

## 2017-04-03 MED ORDER — SODIUM CHLORIDE 0.9 % IV SOLN
80.0000 mg/m2 | Freq: Once | INTRAVENOUS | Status: AC
  Administered 2017-04-03: 120 mg via INTRAVENOUS
  Filled 2017-04-03: qty 20

## 2017-04-03 MED ORDER — SODIUM CHLORIDE 0.9 % IV SOLN
20.0000 mg | Freq: Once | INTRAVENOUS | Status: AC
  Administered 2017-04-03: 20 mg via INTRAVENOUS
  Filled 2017-04-03: qty 2

## 2017-04-03 MED ORDER — VALACYCLOVIR HCL 1 G PO TABS: 1000.0000 mg | ORAL_TABLET | Freq: Every day | ORAL | 0 refills | Status: DC

## 2017-04-03 MED ORDER — FAMOTIDINE IN NACL 20-0.9 MG/50ML-% IV SOLN: 20.0000 mg | Freq: Once | INTRAVENOUS | Status: DC

## 2017-04-03 MED ORDER — SODIUM CHLORIDE 0.9% FLUSH
10.0000 mL | INTRAVENOUS | Status: DC | PRN
  Administered 2017-04-03: 10 mL
  Filled 2017-04-03: qty 10

## 2017-04-03 MED ORDER — SODIUM CHLORIDE 0.9 % IV SOLN
Freq: Once | INTRAVENOUS | Status: AC
  Administered 2017-04-03: 12:00:00 via INTRAVENOUS

## 2017-04-03 MED ORDER — PROCHLORPERAZINE EDISYLATE 5 MG/ML IJ SOLN
10.0000 mg | Freq: Once | INTRAMUSCULAR | Status: AC
  Administered 2017-04-03: 10 mg via INTRAVENOUS

## 2017-04-03 MED ORDER — DIPHENHYDRAMINE HCL 50 MG/ML IJ SOLN
25.0000 mg | Freq: Once | INTRAMUSCULAR | Status: AC
  Administered 2017-04-03: 25 mg via INTRAVENOUS

## 2017-04-03 MED ORDER — DEXAMETHASONE SODIUM PHOSPHATE 10 MG/ML IJ SOLN
INTRAMUSCULAR | Status: AC
  Filled 2017-04-03: qty 1

## 2017-04-03 MED ORDER — PROCHLORPERAZINE EDISYLATE 5 MG/ML IJ SOLN
INTRAMUSCULAR | Status: AC
  Filled 2017-04-03: qty 2

## 2017-04-03 MED ORDER — ACETAMINOPHEN 325 MG PO TABS
650.0000 mg | ORAL_TABLET | Freq: Once | ORAL | Status: AC
  Administered 2017-04-03: 650 mg via ORAL

## 2017-04-03 MED ORDER — DEXAMETHASONE SODIUM PHOSPHATE 10 MG/ML IJ SOLN
4.0000 mg | Freq: Once | INTRAMUSCULAR | Status: AC
  Administered 2017-04-03: 4 mg via INTRAVENOUS

## 2017-04-03 MED ORDER — ACETAMINOPHEN 325 MG PO TABS
ORAL_TABLET | ORAL | Status: AC
  Filled 2017-04-03: qty 2

## 2017-04-03 MED ORDER — HEPARIN SOD (PORK) LOCK FLUSH 100 UNIT/ML IV SOLN
500.0000 [IU] | Freq: Once | INTRAVENOUS | Status: AC | PRN
  Administered 2017-04-03: 500 [IU]
  Filled 2017-04-03: qty 5

## 2017-04-03 MED ORDER — TRASTUZUMAB CHEMO 150 MG IV SOLR
300.0000 mg | Freq: Once | INTRAVENOUS | Status: AC
  Administered 2017-04-03: 300 mg via INTRAVENOUS
  Filled 2017-04-03: qty 14.29

## 2017-04-03 NOTE — Telephone Encounter (Signed)
Patient will receive updated copy of schedule in treatment area. Patient scheduled per 3/5 los.

## 2017-04-03 NOTE — Patient Instructions (Addendum)
[facility] Discharge Instructions for Patients Receiving Chemotherapy  Today you received the following chemotherapy agents Paclitaxel; Herceptin  To help prevent nausea and vomiting after your treatment, we encourage you to take your nausea medication as directed  If you develop nausea and vomiting that is not controlled by your nausea medication, call the clinic.   BELOW ARE SYMPTOMS THAT SHOULD BE REPORTED IMMEDIATELY:  *FEVER GREATER THAN 100.5 F  *CHILLS WITH OR WITHOUT FEVER  NAUSEA AND VOMITING THAT IS NOT CONTROLLED WITH YOUR NAUSEA MEDICATION  *UNUSUAL SHORTNESS OF BREATH  *UNUSUAL BRUISING OR BLEEDING  TENDERNESS IN MOUTH AND THROAT WITH OR WITHOUT PRESENCE OF ULCERS  *URINARY PROBLEMS  *BOWEL PROBLEMS  UNUSUAL RASH Items with * indicate a potential emergency and should be followed up as soon as possible.  Feel free to call the clinic should you have any questions or concerns. The clinic phone number is [phone].  Please show the [place] at check-in to the Emergency Department and triage nurse.

## 2017-04-04 ENCOUNTER — Ambulatory Visit: Payer: 59

## 2017-04-04 DIAGNOSIS — M25611 Stiffness of right shoulder, not elsewhere classified: Secondary | ICD-10-CM

## 2017-04-04 NOTE — Therapy (Signed)
[place], [place], [address] Phone: [phone]   Fax:  [phone]  Physical Therapy Treatment  Patient Details  Name: Katie Marquez MRN: [id number] Date of Birth: [birth year] Referring Provider: Dr. Dalbert Batman   Encounter Date: 04/04/2017  PT End of Session - 04/04/17 1151    Visit Number  4    Number of Visits  9    Date for PT Re-Evaluation  04/20/17    PT Start Time  1107    PT Stop Time  1155    PT Time Calculation (min)  48 min    Activity Tolerance  Patient tolerated treatment well    Behavior During Therapy  [facility] for tasks assessed/performed       Past Medical History:  Diagnosis Date  . Anxiety   . Cancer [facility])    breast cancer  . Diverticulosis   . Family history of kidney cancer   . Family history of thyroid cancer   . Heart murmur   . Hypertension   . IBS (irritable bowel syndrome)   . Iron deficiency   . Osteoarthritis   . PONV (postoperative nausea and vomiting)   . Tremors of nervous system    essential tremors    Past Surgical History:  Procedure Laterality Date  . BREAST LUMPECTOMY WITH RADIOACTIVE SEED AND SENTINEL LYMPH NODE BIOPSY Right 01/08/2017   Procedure: RIGHT BREAST LUMPECTOMY WITH RADIOACTIVE SEED AND RIGHT SENTINEL LYMPH NODE BIOPSY ERAS PATHWAY;  Surgeon: Fanny Skates, MD;  Location: [place];  Service: General;  Laterality: Right;  PEC BLOCK  . LAPAROSCOPIC SIGMOID COLECTOMY    . LAPAROSCOPY    . PORTACATH PLACEMENT Left 01/08/2017   Procedure: INSERTION PORT-A-CATH WITH ULTRA SOUND;  Surgeon: Fanny Skates, MD;  Location: [place];  Service: General;  Laterality: Left;    There were no vitals filed for this visit.  Subjective Assessment - 04/04/17 1112    Subjective  Nothing new except I can tell my Rt shoulder/axilla is really looseing up. It's not all the way better but it is improving! I was started on an oral antiviral for some sores in my mouth but I  can't remember the name.     Pertinent History  01/08/17- R lumpectomy and SLNB (4 removed all negative), pt is currently taking chemo and herceptin, pt will require radiation once she completes radiation    Patient Stated Goals  get full range of motion back with no discomfort    Currently in Pain?  No/denies                      [facility] Adult PT Treatment/Exercise - 04/04/17 0001      Shoulder Exercises: Therapy Ball   Flexion  10 reps;Limitations With forward lean into end of stretch    Flexion Limitations  Demonstration and tactile cuing required for proper technique and then pt able to feel a good stretch    ABduction  10 reps Rt UE with same side lean into end of stretch      Shoulder Exercises: Stretch   Corner Stretch  3 reps;20 seconds in doorway      Manual Therapy   Manual Therapy  Myofascial release;Passive ROM    Myofascial Release  To Rt anterior elbow at cording and to R axilla in area of cording -  no pops felt but cording much less visible and palpable at end of session    Passive ROM  In Supine into abduction for stretch to be felt for cording                  PT Long Term Goals - 03/23/17 1159      PT LONG TERM GOAL #1   Title  Pt will report 75% improvement in feelings of tightness and discomfort from cording in R axilla during RUE movement    Time  4    Period  Weeks    Status  New    Target Date  04/20/17      PT LONG TERM GOAL #2   Title  Pt will be independent in a home exercise program for continued strengthening and stretching    Time  4    Period  Weeks    Status  New    Target Date  04/20/17      PT LONG TERM GOAL #3   Title  Pt will report a 50% improvement in cording from right axilla to wrist to allow improved comfort.    Time  4    Period  Weeks    Status  New    Target Date  04/20/17            Plan - 04/04/17 1230    Clinical Impression Statement  Pt reports feeling good with supine scapular series and not  needing to review these. So focused on manual therapy for release of cording, no release felt but cording continues to be softer by end of session and less limiting to pt. Also reviewed a few stretches in gym at end of session that pt can cont to work on at home for end ROM stretching.     Rehab Potential  Good    Clinical Impairments Affecting Rehab Potential  pt to begin radiation after chemo    PT Frequency  2x / week    PT Duration  4 weeks    PT Treatment/Interventions  ADLs/Self Care Home Management;Therapeutic exercise;Therapeutic activities;Patient/family education;Manual techniques;Manual lymph drainage;Scar mobilization;Taping    PT Next Visit Plan  make sure pt is signed up for abc class in March, cont PROM to Rt shoulder focusing on myofascial to cording in right axilla and anterior elbow    Consulted and Agree with Plan of Care  Patient       Patient will benefit from skilled therapeutic intervention in order to improve the following deficits and impairments:  Increased fascial restricitons, Decreased range of motion, Decreased strength, Decreased knowledge of precautions, Postural dysfunction  Visit Diagnosis: Stiffness of right shoulder, not elsewhere classified     Problem List Patient Active Problem List   Diagnosis Date Noted  . Genetic testing 03/08/2017  . Family history of kidney cancer   . Family history of thyroid cancer   . 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

## 2017-04-06 ENCOUNTER — Ambulatory Visit: Payer: 59 | Admitting: Physical Therapy

## 2017-04-06 DIAGNOSIS — M6281 Muscle weakness (generalized): Secondary | ICD-10-CM

## 2017-04-06 DIAGNOSIS — M25611 Stiffness of right shoulder, not elsewhere classified: Secondary | ICD-10-CM

## 2017-04-06 NOTE — Therapy (Signed)
[place], [place], [address] Phone: [phone]   Fax:  [phone]  Physical Therapy Treatment  Patient Details  Name: Katie Marquez MRN: [id number] Date of Birth: 1955/06/04 Referring Provider: Dr. Dalbert Batman   Encounter Date: 04/06/2017  PT End of Session - 04/06/17 1235    Visit Number  5    Number of Visits  9    Date for PT Re-Evaluation  04/20/17    PT Start Time  6440    PT Stop Time  1100    PT Time Calculation (min)  45 min    Activity Tolerance  Patient tolerated treatment well       Past Medical History:  Diagnosis Date  . Anxiety   . Cancer [facility])    breast cancer  . Diverticulosis   . Family history of kidney cancer   . Family history of thyroid cancer   . Heart murmur   . Hypertension   . IBS (irritable bowel syndrome)   . Iron deficiency   . Osteoarthritis   . PONV (postoperative nausea and vomiting)   . Tremors of nervous system    essential tremors    Past Surgical History:  Procedure Laterality Date  . BREAST LUMPECTOMY WITH RADIOACTIVE SEED AND SENTINEL LYMPH NODE BIOPSY Right 01/08/2017   Procedure: RIGHT BREAST LUMPECTOMY WITH RADIOACTIVE SEED AND RIGHT SENTINEL LYMPH NODE BIOPSY ERAS PATHWAY;  Surgeon: Fanny Skates, MD;  Location: [place];  Service: General;  Laterality: Right;  PEC BLOCK  . LAPAROSCOPIC SIGMOID COLECTOMY    . LAPAROSCOPY    . PORTACATH PLACEMENT Left 01/08/2017   Procedure: INSERTION PORT-A-CATH WITH ULTRA SOUND;  Surgeon: Fanny Skates, MD;  Location: [place];  Service: General;  Laterality: Left;    There were no vitals filed for this visit.  Subjective Assessment - 04/06/17 1224    Subjective  Pt cointinues to have tightness in her right elbow up into upper arm  She has not been wearing the tg soft at home and hasn't been doing as much exercises as she probably should     Pertinent History  01/08/17- R lumpectomy and SLNB (4 removed all  negative), pt is currently taking chemo and herceptin, pt will require radiation once she completes radiation    Patient Stated Goals  get full range of motion back with no discomfort    Currently in Pain?  No/denies                      [facility] Adult PT Treatment/Exercise - 04/06/17 0001      Self-Care   Self-Care  Other Self-Care Comments    Other Self-Care Comments   small tg soft to right arm that pr wore during exercise.  Script faxed to Dr. Dalbert Batman for class one compression sleeve to help with cording       Lumbar Exercises: Supine   Bridge  5 reps      Lumbar Exercises: Sidelying   Clam  5 reps      Lumbar Exercises: Quadruped   Opposite Arm/Leg Raise  Right arm/Left leg;Left arm/Right leg;5 reps      Shoulder Exercises: Supine   Protraction  Strengthening;Right;Left;10 reps 2#      Shoulder Exercises: Sidelying   External Rotation  Strengthening;Right;20 reps 10 reps AROM, 10 reps 10 sec holds with 2# weight    ABduction  AROM;Right;5 reps with wrist extension and deep breath at the top  Other Sidelying Exercises  shoulder horizontal abduction with thoracic rotation and wrist extension moving arm into flexion for more stretch to axilla       Manual Therapy   Manual Therapy  Manual Lymphatic Drainage (MLD)    Manual therapy comments  suggested pt wear tg soft more often and place small pillow in axilla for compression at full area     Manual Lymphatic Drainage (MLD)  short neck, deep diaphragmatic breathing right inguinal nodes and brief MLD to right upper quadrand especially to fullness and cording  at right axilla and elbow                   PT Long Term Goals - 04/06/17 1244      PT LONG TERM GOAL #1   Title  Pt will report 75% improvement in feelings of tightness and discomfort from cording in R axilla during RUE movement    Time  4    Period  Weeks    Status  On-going      PT LONG TERM GOAL #2   Title  Pt will be independent in a home  exercise program for continued strengthening and stretching    Time  4    Period  Weeks    Status  On-going      PT LONG TERM GOAL #3   Title  Pt will report a 50% improvement in cording from right axilla to wrist to allow improved comfort.    Time  4    Period  Weeks    Status  On-going            Plan - 04/06/17 1235    Clinical Impression Statement  Pt still with reports of pulling in right arm with cording visbile and fullness in axilla with full ROM of shoulder.  performed manual work but pt did not have release with this today so focused on upgradeing exercise. Hopeful exercise with mild compression with help with cording  Pt would like to learn Stength ABC program and core work from that was started today     PT Next Visit Plan  make sure pt is signed up for abc class in March, cont PROM to Rt shoulder focusing on myofascial to cording in right axilla and anterior elbow  Teach Strength ABC program with ight weights.     PT Home Exercise Plan  supine dowel exercises and supine scapular series    Consulted and Agree with Plan of Care  Patient       Patient will benefit from skilled therapeutic intervention in order to improve the following deficits and impairments:     Visit Diagnosis: Stiffness of right shoulder, not elsewhere classified  Muscle weakness (generalized)     Problem List Patient Active Problem List   Diagnosis Date Noted  . Genetic testing 03/08/2017  . Family history of kidney cancer   . Family history of thyroid cancer   . 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 Owens Shark PT   Norwood Levo 04/06/2017, 12:45 PM  [place], [place], [address] Phone: [phone]   Fax:  [phone]  Name: FLORIDA NOLTON MRN: [id number] Date of Birth: [birth year]

## 2017-04-06 NOTE — Patient Instructions (Signed)
Lie on your left side with right arm reaching back and overhead with wrist back...Marland KitchenMarland KitchenMarland Kitchen Take a deep breath and hold for a count of 10     Hand on door jamb at hip height, turn body away to stretch front of elbow   First of all, check with your insurance company to see if provider is in [place]   (for wigs and compression sleeves / gloves/gauntlets )  [place], [place] [address] [phone]  Will file some insurances --- call for appointment   Second to [facility] (for mastectomy prosthetics and garments) [place], [place] [address] [phone] Will file some insurances --- call for appointment  [facility]  [address]  [place], [place] [address] [phone] Lower extremity garments  [facility] [address] [place], [place]  [address] [place] Sales rep:  [place]:  [phone] [email or web address] Biocompression pumps   [employer]  Sales rep: Donneta Romberg:  [phone] [email or web address] Lyn[employer] Pulley and Flexitouch pumps    Other Resources: National Lymphedema Network:  www.lymphnet.org www.[email or web address] for patient articles and purchase a self manual lymph drainage DVD [email or web address] has informative articles.

## 2017-04-09 ENCOUNTER — Encounter: Payer: 59 | Admitting: Physical Therapy

## 2017-04-09 ENCOUNTER — Ambulatory Visit: Payer: 59

## 2017-04-09 DIAGNOSIS — M25611 Stiffness of right shoulder, not elsewhere classified: Secondary | ICD-10-CM

## 2017-04-09 DIAGNOSIS — M6281 Muscle weakness (generalized): Secondary | ICD-10-CM

## 2017-04-09 NOTE — Therapy (Signed)
[place], [place], [address] Phone: [phone]   Fax:  [phone]  Physical Therapy Treatment  Patient Details  Name: Katie Marquez MRN: [id number] Date of Birth: 04-20-1955 Referring Provider: Dr. Dalbert Batman   Encounter Date: 04/09/2017  PT End of Session - 04/09/17 1036    Visit Number  6    Number of Visits  9    Date for PT Re-Evaluation  04/20/17    PT Start Time  0936    PT Stop Time  1018    PT Time Calculation (min)  42 min    Activity Tolerance  Patient tolerated treatment well    Behavior During Therapy  [facility] for tasks assessed/performed       Past Medical History:  Diagnosis Date  . Anxiety   . Cancer [facility])    breast cancer  . Diverticulosis   . Family history of kidney cancer   . Family history of thyroid cancer   . Heart murmur   . Hypertension   . IBS (irritable bowel syndrome)   . Iron deficiency   . Osteoarthritis   . PONV (postoperative nausea and vomiting)   . Tremors of nervous system    essential tremors    Past Surgical History:  Procedure Laterality Date  . BREAST LUMPECTOMY WITH RADIOACTIVE SEED AND SENTINEL LYMPH NODE BIOPSY Right 01/08/2017   Procedure: RIGHT BREAST LUMPECTOMY WITH RADIOACTIVE SEED AND RIGHT SENTINEL LYMPH NODE BIOPSY ERAS PATHWAY;  Surgeon: Fanny Skates, MD;  Location: [place];  Service: General;  Laterality: Right;  PEC BLOCK  . LAPAROSCOPIC SIGMOID COLECTOMY    . LAPAROSCOPY    . PORTACATH PLACEMENT Left 01/08/2017   Procedure: INSERTION PORT-A-CATH WITH ULTRA SOUND;  Surgeon: Fanny Skates, MD;  Location: [place];  Service: General;  Laterality: Left;    There were no vitals filed for this visit.  Subjective Assessment - 04/09/17 0940    Subjective  The cording is still just there, but my ROM really is ok. I want to learn the Strength ABC Program she told me about last time.    Pertinent History  01/08/17- R lumpectomy and SLNB (4  removed all negative), pt is currently taking chemo and herceptin, pt will require radiation once she completes radiation    Patient Stated Goals  get full range of motion back with no discomfort    Currently in Pain?  No/denies                      [facility] Adult PT Treatment/Exercise - 04/09/17 0001      Shoulder Exercises: ROM/Strengthening   Other ROM/Strengthening Exercises  Instructed pt in Strength ABC Program; able to get through entirety of program and pt able to return correct therapist demonstration. Did modify crunches to instructing pt in pelvic tilts, also with LE exs ( clam, alt march, and heel slides) then also reverse curl which was; used 2 lb weights throughout.              PT Education - 04/09/17 1041    Education provided  Yes    Education Details  At end of session showed pt how to perform self myofascial release at anterior elbow by using doorway for end ROM stretching and then "hanging" from arm gently and then myofascial release to elbow in this position. Pt able to return good demonstration.; Strength ABC Program    Person(s) Educated  Patient    Methods  Explanation;Demonstration;Handout Handout for Strength ABC    Comprehension  Verbalized understanding;Returned demonstration;Need further instruction          PT Long Term Goals - 04/06/17 1244      PT LONG TERM GOAL #1   Title  Pt will report 75% improvement in feelings of tightness and discomfort from cording in R axilla during RUE movement    Time  4    Period  Weeks    Status  On-going      PT LONG TERM GOAL #2   Title  Pt will be independent in a home exercise program for continued strengthening and stretching    Time  4    Period  Weeks    Status  On-going      PT LONG TERM GOAL #3   Title  Pt will report a 50% improvement in cording from right axilla to wrist to allow improved comfort.    Time  4    Period  Weeks    Status  On-going            Plan - 04/09/17 1036     Clinical Impression Statement  Pt continues to report cording present but did want to learn the Strength ABC Program exercise packet today so she can have something to continue to work on improving her Strength at home while we can continue to work on her cording. Pt did very well with return demonstration of all exercises except did not want to do crunches as she reports history of back pain. So instead instructed her in lumbar stabilizations with pelvic tilt and LE exs (see flowsheet) and then into reverse curl which was very challenginng due to weak abdominal muscles. Pt was pleased at end of session having learned this and would like to return to manual therapy at next session to further work on decreasing her cording.      Rehab Potential  Good    Clinical Impairments Affecting Rehab Potential  pt to begin radiation after chemo    PT Frequency  2x / week    PT Duration  4 weeks    PT Treatment/Interventions  ADLs/Self Care Home Management;Therapeutic exercise;Therapeutic activities;Patient/family education;Manual techniques;Manual lymph drainage;Scar mobilization;Taping    PT Next Visit Plan  Review, if needed, any questions pt may have from Strength ABC Program. Then cont to focus on myofascial release to cording at Rt axilla and anterior elbow.    Recommended Other Services  Pt signed up for ABC class after next visit on Monday, 04/16/17    Consulted and Agree with Plan of Care  Patient       Patient will benefit from skilled therapeutic intervention in order to improve the following deficits and impairments:  Increased fascial restricitons, Decreased range of motion, Decreased strength, Decreased knowledge of precautions, Postural dysfunction  Visit Diagnosis: Stiffness of right shoulder, not elsewhere classified  Muscle weakness (generalized)     Problem List Patient Active Problem List   Diagnosis Date Noted  . Genetic testing 03/08/2017  . Family history of kidney cancer   .  Family history of thyroid cancer   . 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

## 2017-04-10 ENCOUNTER — Inpatient Hospital Stay: Payer: 59

## 2017-04-10 DIAGNOSIS — Z170 Estrogen receptor positive status [ER+]: Principal | ICD-10-CM

## 2017-04-10 DIAGNOSIS — C50511 Malignant neoplasm of lower-outer quadrant of right female breast: Secondary | ICD-10-CM

## 2017-04-10 LAB — CBC WITH DIFFERENTIAL (CANCER CENTER ONLY)
Basophils Absolute: 0.1 10*3/uL (ref 0.0–0.1)
Basophils Relative: 1 %
EOS PCT: 4 %
Eosinophils Absolute: 0.2 10*3/uL (ref 0.0–0.5)
HCT: 37.9 % (ref 34.8–46.6)
HEMOGLOBIN: 12.7 g/dL (ref 11.6–15.9)
LYMPHS ABS: 1.3 10*3/uL (ref 0.9–3.3)
LYMPHS PCT: 23 %
MCH: 29.5 pg (ref 25.1–34.0)
MCHC: 33.4 g/dL (ref 31.5–36.0)
MCV: 88.3 fL (ref 79.5–101.0)
Monocytes Absolute: 0.5 10*3/uL (ref 0.1–0.9)
Monocytes Relative: 9 %
NEUTROS PCT: 63 %
Neutro Abs: 3.5 10*3/uL (ref 1.5–6.5)
PLATELETS: 376 10*3/uL (ref 145–400)
RBC: 4.29 MIL/uL (ref 3.70–5.45)
RDW: 15.8 % — ABNORMAL HIGH (ref 11.2–14.5)
WBC: 5.5 10*3/uL (ref 3.9–10.3)

## 2017-04-10 LAB — CMP (CANCER CENTER ONLY)
ALK PHOS: 58 U/L (ref 40–150)
ALT: 35 U/L (ref 0–55)
AST: 22 U/L (ref 5–34)
Albumin: 3.8 g/dL (ref 3.5–5.0)
Anion gap: 7 (ref 3–11)
BUN: 19 mg/dL (ref 7–26)
CALCIUM: 10.1 mg/dL (ref 8.4–10.4)
CHLORIDE: 104 mmol/L (ref 98–109)
CO2: 27 mmol/L (ref 22–29)
CREATININE: 0.79 mg/dL (ref 0.60–1.10)
GFR, Estimated: >60 mL/min (ref >60)
Glucose, Bld: 93 mg/dL (ref 70–140)
Potassium: 4.4 mmol/L (ref 3.5–5.1)
SODIUM: 138 mmol/L (ref 136–145)
Total Bilirubin: 0.5 mg/dL (ref 0.2–1.2)
Total Protein: 7 g/dL (ref 6.4–8.3)

## 2017-04-10 MED ORDER — SODIUM CHLORIDE 0.9 % IV SOLN
Freq: Once | INTRAVENOUS | Status: AC
  Administered 2017-04-10: 12:00:00 via INTRAVENOUS

## 2017-04-10 MED ORDER — SODIUM CHLORIDE 0.9 % IV SOLN
20.0000 mg | Freq: Once | INTRAVENOUS | Status: AC
  Administered 2017-04-10: 20 mg via INTRAVENOUS
  Filled 2017-04-10: qty 2

## 2017-04-10 MED ORDER — PROCHLORPERAZINE EDISYLATE 5 MG/ML IJ SOLN
10.0000 mg | Freq: Once | INTRAMUSCULAR | Status: AC
  Administered 2017-04-10: 10 mg via INTRAVENOUS

## 2017-04-10 MED ORDER — HEPARIN SOD (PORK) LOCK FLUSH 100 UNIT/ML IV SOLN
500.0000 [IU] | Freq: Once | INTRAVENOUS | Status: AC | PRN
  Administered 2017-04-10: 500 [IU]
  Filled 2017-04-10: qty 5

## 2017-04-10 MED ORDER — FAMOTIDINE IN NACL 20-0.9 MG/50ML-% IV SOLN: 20.0000 mg | Freq: Once | INTRAVENOUS | Status: DC

## 2017-04-10 MED ORDER — SODIUM CHLORIDE 0.9 % IV SOLN
80.0000 mg/m2 | Freq: Once | INTRAVENOUS | Status: AC
  Administered 2017-04-10: 120 mg via INTRAVENOUS
  Filled 2017-04-10: qty 20

## 2017-04-10 MED ORDER — DIPHENHYDRAMINE HCL 50 MG/ML IJ SOLN
INTRAMUSCULAR | Status: AC
  Filled 2017-04-10: qty 1

## 2017-04-10 MED ORDER — DIPHENHYDRAMINE HCL 50 MG/ML IJ SOLN
25.0000 mg | Freq: Once | INTRAMUSCULAR | Status: AC
  Administered 2017-04-10: 25 mg via INTRAVENOUS

## 2017-04-10 MED ORDER — DEXAMETHASONE SODIUM PHOSPHATE 10 MG/ML IJ SOLN
4.0000 mg | Freq: Once | INTRAMUSCULAR | Status: AC
  Administered 2017-04-10: 4 mg via INTRAVENOUS

## 2017-04-10 MED ORDER — SODIUM CHLORIDE 0.9% FLUSH
10.0000 mL | INTRAVENOUS | Status: DC | PRN
  Administered 2017-04-10: 10 mL
  Filled 2017-04-10: qty 10

## 2017-04-10 MED ORDER — DEXAMETHASONE SODIUM PHOSPHATE 10 MG/ML IJ SOLN
INTRAMUSCULAR | Status: AC
  Filled 2017-04-10: qty 1

## 2017-04-10 MED ORDER — PROCHLORPERAZINE EDISYLATE 5 MG/ML IJ SOLN
INTRAMUSCULAR | Status: AC
  Filled 2017-04-10: qty 2

## 2017-04-12 ENCOUNTER — Encounter: Payer: 59 | Admitting: Physical Therapy

## 2017-04-12 NOTE — Progress Notes (Signed)
[name]  [phone] [phone]     ID: [name] DOB: November 08, 1955  MR#: [id number]  ZDG#:[id number]  Patient Care Team: Jonathon Jordan, MD as PCP - General (Family Medicine) Fanny Skates, MD as Consulting Physician (General Surgery) Kalim Kissel, Virgie Dad, MD as Consulting Physician (Oncology) Richmond Campbell, MD as Consulting Physician (Gastroenterology) Eppie Gibson, MD as Attending Physician (Radiation Oncology) Lovey Newcomer, MD as Attending Physician (Radiology) Bensimhon, Shaune Pascal, MD as Consulting Physician (Cardiology) Molli Posey, MD as Consulting Physician (Obstetrics and Gynecology) OTHER MD:  CHIEF COMPLAINT: Triple positive breast cancer  CURRENT TREATMENT: Completing paclitaxel, continuing trastuzumab; adjuvant radiation pending   HISTORY OF CURRENT ILLNESS: From the original intake note:  Katie Marquez had bilateral screening mammography with tomography at the breast center 12/12/2016, showing a possible mass in the right breast.  Right breast diagnostic mammography with tomography and right breast ultrasonography 12/15/2016 found the breast density to be category D.  In the lower right breast there was a small lobulated mass which was not palpable.  Ultrasound showed a 0.8 cm mass in the right breast 7:00 radiant 2 cm from the nipple.  The right axilla was benign.  Biopsy of the right breast mass in question 12/15/2016 showed ([id number]) and invasive ductal carcinoma, grade 3, estrogen receptor at 95% positive, progesterone receptor 50% positive, both with strong staining intensity, with an MIB-1 of 15%, and HER-2 amplification, the signals ratio being 1.83 but the number per cell 10.23.  The patient's subsequent history is as detailed below.  INTERVAL HISTORY: Katie Marquez returns today for a follow-up of her triple positive breast cancer accompanied by her husband. She finalizes her treatment of paclitaxel today this being day 1  cycle 12. She has tolerated this well. She has had body aches and perhaps and increase in her benign tremor.  She did develop a rash last week after some sun exposure.  She had minimal swelling of her right hand a little bit more than the left.  She denies issues with oral sores or peripheral neuropathy.   She also receives trastuzumab every 21 days with her next due on 04/24/2017. She tolerates this well.    REVIEW OF SYSTEMS: [place] reports that she fees well overall. She checks her blood pressure at home. She usually gets around 140/90. She continues on lisinopril which she has tolerated well. She has had worsening tremors over the last few weeks. Her hands have been trembling more. She is going to physical therapy for cording issues. She also took a class on lymphedema. She was advised on how to get a sleeve. She had a mild rash on her right hand that developed on 04/18/2017. By the next day, the rash was red and swollen on both of her hands and on her upper mid chest. She now thinks this could be due to her being in the sun while waiting in the car the day before. She denies unusual headaches, visual changes, nausea, vomiting, or dizziness. There has been no unusual cough, phlegm production, or pleurisy. This been no change in bowel or bladder habits. She denies unexplained fatigue or unexplained weight loss, bleeding, rash, or fever. A detailed review of systems was otherwise stable.      PAST MEDICAL HISTORY: Past Medical History:  Diagnosis Date  . Anxiety   . Cancer [facility])    breast cancer  . Diverticulosis   . Family history of kidney cancer   . Family history of thyroid cancer   .  Heart murmur   . Hypertension   . IBS (irritable bowel syndrome)   . Iron deficiency   . Osteoarthritis   . PONV (postoperative nausea and vomiting)   . Tremors of nervous system    essential tremors    PAST SURGICAL HISTORY: Past Surgical History:  Procedure Laterality Date  . BREAST LUMPECTOMY  WITH RADIOACTIVE SEED AND SENTINEL LYMPH NODE BIOPSY Right 01/08/2017   Procedure: RIGHT BREAST LUMPECTOMY WITH RADIOACTIVE SEED AND RIGHT SENTINEL LYMPH NODE BIOPSY ERAS PATHWAY;  Surgeon: Fanny Skates, MD;  Location: [facility];  Service: General;  Laterality: Right;  PEC BLOCK  . LAPAROSCOPIC SIGMOID COLECTOMY    . LAPAROSCOPY    . PORTACATH PLACEMENT Left 01/08/2017   Procedure: INSERTION PORT-A-CATH WITH ULTRA SOUND;  Surgeon: Fanny Skates, MD;  Location: [facility] OR;  Service: General;  Laterality: Left;    FAMILY HISTORY Family History  Problem Relation Age of Onset  . Rheum arthritis Mother   . Hyperlipidemia Mother   . Hypertension Mother   . Thyroid cancer Mother 66  . Diverticulitis Father   . Parkinson's disease Father   . Kidney cancer Father 59  . Multiple sclerosis Maternal Aunt   . Breast cancer Neg Hx   Katie Marquez comes from an [place] background.  Her father died at 93 with Parkinson's disease and complications from tobacco abuse including a history of renal cancer; the patient's mother died at age 30, with a history of rheumatoid arthritis and thyroid cancer.  The patient had no brothers, 1 sister, in good health.  There is no history of breast or ovarian cancer in the family  GYNECOLOGIC HISTORY:  No LMP recorded. Patient is postmenopausal. Menarche age 71, first live birth age 87.  The patient is GX P1.  She had infertility treatments after her first live birth but they were not successful.  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  Their daughter [name] lives in [place].  She attended [employer] and then the [employer] in business and psychology.  She is planning to get married in September 2019.  Her fiance is in finance.     ADVANCED DIRECTIVES:    HEALTH  MAINTENANCE: Social History   Tobacco Use  . Smoking status: Never Smoker  . Smokeless tobacco: Never Used  Substance Use Topics  . Alcohol use: Yes    Frequency: Never    Comment: occas  . Drug use: No     Colonoscopy: [facility]  PAP: [place]  Bone density: Osteopenia, remote test   No Known Allergies  Current Outpatient Medications  Medication Sig Dispense Refill  . CALCIUM PO Take by mouth daily.    . cholecalciferol (VITAMIN D) 1000 units tablet Take 2,000 Units by mouth daily.    Marland Kitchen FLUoxetine (PROZAC) 10 MG capsule Take 20 mg by mouth daily.     Marland Kitchen gabapentin (NEURONTIN) 300 MG capsule Take 1 capsule (300 mg total) by mouth at bedtime. 90 capsule 4  . lidocaine-prilocaine (EMLA) cream Apply to affected area once 30 g 3  . lisinopril (PRINIVIL,ZESTRIL) 10 MG tablet Take 1 tablet (10 mg total) by mouth daily. 30 tablet 3  . LORazepam (ATIVAN) 0.5 MG tablet Take 1 tablet (0.5 mg total) by mouth every 6 (six) hours as needed (Nausea or vomiting). 30 tablet 0  .  ondansetron (ZOFRAN) 8 MG tablet Take 1 tablet (8 mg total) by mouth 2 (two) times daily as needed (Nausea or vomiting). 30 tablet 1  . Probiotic Product (PROBIOTIC-10 PO) Take by mouth.    . prochlorperazine (COMPAZINE) 10 MG tablet Take 1 tablet (10 mg total) by mouth every 6 (six) hours as needed (Nausea or vomiting). 30 tablet 1  . Red Yeast Rice 600 MG CAPS Take by mouth.    . valACYclovir (VALTREX) 1000 MG tablet Take 1 tablet (1,000 mg total) by mouth daily. 30 tablet 0   No current facility-administered medications for this visit.     OBJECTIVE: Middle-aged white Marquez in no acute distress  Vitals:   04/17/17 1210  BP: (!) 141/92  Pulse: 79  Resp: 18  Temp: 98.4 F (36.9 C)  SpO2: 100%     Body mass index is 21.77 kg/m.   Wt Readings from Last 3 Encounters:  04/17/17 119 lb (54 kg)  04/03/17 119 lb 1.6 oz (54 kg)  03/20/17 118 lb 11.2 oz (53.8 kg)      ECOG FS:1 - Symptomatic but completely  ambulatory   Sclerae unicteric, EOMs intact Oropharynx clear and moist No cervical or supraclavicular adenopathy Lungs no rales or rhonchi Heart regular rate and rhythm Abd soft, nontender, positive bowel sounds MSK no focal spinal tenderness, no upper extremity lymphedema Neuro: nonfocal, well oriented, appropriate affect Breasts: Right breast is status post lumpectomy.  There is no evidence of local recurrence.  The cosmetic result is good.  The left breast is benign.  Both axillae are benign.   LAB RESULTS:  CMP     Component Value Date/Time   NA 137 04/17/2017 1156   NA 141 01/31/2017 1225   K 4.3 04/17/2017 1156   K 4.1 01/31/2017 1225   CL 103 04/17/2017 1156   CO2 27 04/17/2017 1156   CO2 29 01/31/2017 1225   GLUCOSE 114 04/17/2017 1156   GLUCOSE 86 01/31/2017 1225   BUN 21 04/17/2017 1156   BUN 17.4 01/31/2017 1225   CREATININE 0.84 04/17/2017 1156   CREATININE 0.8 01/31/2017 1225   CALCIUM 9.7 04/17/2017 1156   CALCIUM 10.0 01/31/2017 1225   PROT 6.6 04/17/2017 1156   PROT 7.2 01/31/2017 1225   ALBUMIN 3.5 04/17/2017 1156   ALBUMIN 4.0 01/31/2017 1225   AST 16 04/17/2017 1156   AST 18 01/31/2017 1225   ALT 19 04/17/2017 1156   ALT 29 01/31/2017 1225   ALKPHOS 54 04/17/2017 1156   ALKPHOS 66 01/31/2017 1225   BILITOT 0.5 04/17/2017 1156   BILITOT 0.57 01/31/2017 1225   GFRNONAA >60 04/17/2017 1156   GFRAA >60 04/17/2017 1156    No results found for: TOTALPROTELP, ALBUMINELP, A1GS, A2GS, BETS, BETA2SER, GAMS, MSPIKE, SPEI  No results found for: KPAFRELGTCHN, LAMBDASER, [facility]  Lab Results  Component Value Date   WBC 5.7 04/17/2017   NEUTROABS 3.5 04/17/2017   HGB 14.4 01/31/2017   HCT 37.0 04/17/2017   MCV 88.9 04/17/2017   PLT 380 04/17/2017      Chemistry      Component Value Date/Time   NA 137 04/17/2017 1156   NA 141 01/31/2017 1225   K 4.3 04/17/2017 1156   K 4.1 01/31/2017 1225   CL 103 04/17/2017 1156   CO2 27 04/17/2017 1156    CO2 29 01/31/2017 1225   BUN 21 04/17/2017 1156   BUN 17.4 01/31/2017 1225   CREATININE 0.84 04/17/2017 1156   CREATININE 0.8 01/31/2017  1225      Component Value Date/Time   CALCIUM 9.7 04/17/2017 1156   CALCIUM 10.0 01/31/2017 1225   ALKPHOS 54 04/17/2017 1156   ALKPHOS 66 01/31/2017 1225   AST 16 04/17/2017 1156   AST 18 01/31/2017 1225   ALT 19 04/17/2017 1156   ALT 29 01/31/2017 1225   BILITOT 0.5 04/17/2017 1156   BILITOT 0.57 01/31/2017 1225       No results found for: LABCA2  No components found for: TMAUQJ335  No results for input(s): INR in the last 168 hours.  No results found for: LABCA2  No results found for: KTG256  No results found for: LSL373  No results found for: SKA768  No results found for: CA2729  No components found for: HGQUANT  No results found for: CEA1 / No results found for: CEA1   No results found for: AFPTUMOR  No results found for: CHROMOGRNA  No results found for: PSA1  Appointment on 04/17/2017  Component Date Value Ref Range Status  . Sodium 04/17/2017 137  136 - 145 mmol/L Final  . Potassium 04/17/2017 4.3  3.5 - 5.1 mmol/L Final  . Chloride 04/17/2017 103  98 - 109 mmol/L Final  . CO2 04/17/2017 27  22 - 29 mmol/L Final  . Glucose, Bld 04/17/2017 114  70 - 140 mg/dL Final  . BUN 04/17/2017 21  7 - 26 mg/dL Final  . Creatinine 04/17/2017 0.84  0.60 - 1.10 mg/dL Final  . Calcium 04/17/2017 9.7  8.4 - 10.4 mg/dL Final  . Total Protein 04/17/2017 6.6  6.4 - 8.3 g/dL Final  . Albumin 04/17/2017 3.5  3.5 - 5.0 g/dL Final  . AST 04/17/2017 16  5 - 34 U/L Final  . ALT 04/17/2017 19  0 - 55 U/L Final  . Alkaline Phosphatase 04/17/2017 54  40 - 150 U/L Final  . Total Bilirubin 04/17/2017 0.5  0.2 - 1.2 mg/dL Final  . GFR, Est Non Af Am 04/17/2017 >60  >60 mL/min Final  . GFR, Est AFR Am 04/17/2017 >60  >60 mL/min Final   Comment: (NOTE) The eGFR has been calculated using the CKD EPI equation. This calculation has not been  validated in all clinical situations. eGFR's persistently <60 mL/min signify possible Chronic Kidney Disease.   Georgiann Hahn gap 04/17/2017 7  3 - 11 Final   Performed at [facility], [place] [address], [place], [place] [address]  . WBC Count 04/17/2017 5.7  3.9 - 10.3 K/uL Final  . RBC 04/17/2017 4.16  3.70 - 5.45 MIL/uL Final  . Hemoglobin 04/17/2017 12.6  11.6 - 15.9 g/dL Final  . HCT 04/17/2017 37.0  34.8 - 46.6 % Final  . MCV 04/17/2017 88.9  79.5 - 101.0 fL Final  . MCH 04/17/2017 30.3  25.1 - 34.0 pg Final  . MCHC 04/17/2017 34.1  31.5 - 36.0 g/dL Final  . RDW 04/17/2017 16.1* 11.2 - 14.5 % Final  . Platelet Count 04/17/2017 380  145 - 400 K/uL Final  . Neutrophils Relative % 04/17/2017 62  % Final  . Neutro Abs 04/17/2017 3.5  1.5 - 6.5 K/uL Final  . Lymphocytes Relative 04/17/2017 19  % Final  . Lymphs Abs 04/17/2017 1.1  0.9 - 3.3 K/uL Final  . Monocytes Relative 04/17/2017 12  % Final  . Monocytes Absolute 04/17/2017 0.7  0.1 - 0.9 K/uL Final  . Eosinophils Relative 04/17/2017 5  % Final  . Eosinophils Absolute 04/17/2017 0.3  0.0 - 0.5 K/uL Final  . Basophils Relative 04/17/2017 2  % Final  . Basophils Absolute 04/17/2017 0.1  0.0 - 0.1 K/uL Final   Performed at [facility], [place] [address], [place], [place] [address]    (this displays the last labs from the last 3 days)  No results found for: TOTALPROTELP, ALBUMINELP, A1GS, A2GS, BETS, BETA2SER, GAMS, MSPIKE, SPEI (this displays SPEP labs)  No results found for: KPAFRELGTCHN, LAMBDASER, KAPLAMBRATIO (kappa/lambda light chains)  No results found for: HGBA, HGBA2QUANT, HGBFQUANT, HGBSQUAN (Hemoglobinopathy evaluation)   No results found for: LDH  No results found for: IRON, TIBC, IRONPCTSAT (Iron and TIBC)  No results found for: FERRITIN  Urinalysis No results found for: COLORURINE, APPEARANCEUR, LABSPEC, PHURINE, GLUCOSEU, HGBUR, BILIRUBINUR, KETONESUR, PROTEINUR,  UROBILINOGEN, NITRITE, LEUKOCYTESUR   STUDIES: No results found.   ELIGIBLE FOR AVAILABLE RESEARCH PROTOCOL: no  ASSESSMENT: 62 y.o. Katie Marquez status post right breast lower outer quadrant biopsy 12/15/2016 for a clinical T1b N0, stage IA invasive ductal carcinoma, grade 3, triple positive, with an MIB-1 of 15%  (1) status post right lumpectomy 01/08/2017 for a  pT1c pN0, stage IA invasive ductal carcinoma, grade 2, with negative margins.    (2) adjuvant chemotherapy consisting of paclitaxel weekly x12, together with trastuzumab every 21 days stared 01/31/2017  (3) trastuzumab to be continued to total 1 year (through December 2019)  (a) echocardiogram 01/17/2017 demonstrates a LVEF of 65-70%  (b) echocardiogram 04/19/2017 pending  (4) adjuvant radiation planned  (5) antiestrogens to follow at the completion of local treatment  (6) genetics testing completed on 02/20/2017 offered through [facility] showed: no deleterious mutations. The following genes were evaluated for sequence changes and exonic deletions/duplications: APC, ATM, AXIN2, BAP1, BARD1, BMPR1A, BRCA1, BRCA2, BRIP1, BUB1B, CDC73, CDH1, CDK4, CDKN1C, CDKN2A (p14ARF), CDKN2A (p16INK4a), CEP57, CHEK2, CTNNA1, DICER1, DIS3L2, EPCAM*, FH, FLCN, GPC3, GREM1*, KIT, MEN1, MET, MLH1, MSH2, MSH3, MSH6, MUTYH, NBN, NF1, PALB2, PDGFRA, PMS2, POLD1, POLE, PTEN, RAD50, RAD51C, RAD51D, SDHB,SDHC, SDHD, SMAD4, SMARCA4, SMARCB1, STK11, TP53, TSC1, TSC2, VHL, WT1. The following genes were evaluated for sequence changes only: HOXB13*, MITF*, NTHL1*, SDHA. Results are negative unless otherwise indicated  PLAN: Katie Marquez will complete her chemotherapy today.  She has done remarkably well, with no dose reductions or delays.  She has had absolutely no peripheral neuropathy symptoms.  She has had a slight rise in her blood pressure.  She thinks perhaps her essential tremor may be a little bit more noticeable.  Of course she also developed a sun rash  last week.  There is a minimal amount of swelling in her right hand today.  She is planning to obtain a compression sleeve and she is learning how to do massage to prevent lymphedema  She is now ready to start adjuvant radiation and is already scheduled to meet with Dr. Isidore Moos and also for simulation  She will have her next echocardiogram later this week  We discussed the fact that posttraumatic stress is particularly noticeable at the completion of chemotherapy and it is normal to experience some grief during this time.  I recommended that she consider the finding your new normal group.  She can participate sometime in May or June.  I am going to see her with her 06/05/2017 Herceptin treatment and at that time we will discuss antiestrogens  She knows to call for any other issues that may develop before the next visit.   Reema Chick, Virgie Dad, MD  04/17/17 12:35 PM Medical Oncology and  Hematology [facility] [place], [place] [address] Tel. [phone]    Fax. [phone]  This document serves as a record of services personally performed by Lurline Del, MD. It was created on his behalf by Sheron Nightingale, a trained medical scribe. The creation of this record is based on the scribe's personal observations and the provider's statements to them.   I have reviewed the above documentation for accuracy and completeness, and I agree with the above.

## 2017-04-16 ENCOUNTER — Ambulatory Visit: Payer: 59 | Admitting: Physical Therapy

## 2017-04-16 ENCOUNTER — Encounter: Payer: Self-pay | Admitting: Physical Therapy

## 2017-04-16 DIAGNOSIS — M25611 Stiffness of right shoulder, not elsewhere classified: Secondary | ICD-10-CM | POA: Diagnosis not present

## 2017-04-16 NOTE — Therapy (Signed)
[place], [place], [address] Phone: [phone]   Fax:  [phone]  Physical Therapy Treatment  Patient Details  Name: Katie Marquez MRN: [id number] Date of Birth: 1955/06/03 Referring Provider: Dr. Dalbert Batman   Encounter Date: 04/16/2017  PT End of Session - 04/16/17 1107    Visit Number  7    Number of Visits  9    Date for PT Re-Evaluation  04/20/17    PT Start Time  1020    PT Stop Time  1101    PT Time Calculation (min)  41 min    Activity Tolerance  Patient tolerated treatment well    Behavior During Therapy  [facility] for tasks assessed/performed       Past Medical History:  Diagnosis Date  . Anxiety   . Cancer [facility])    breast cancer  . Diverticulosis   . Family history of kidney cancer   . Family history of thyroid cancer   . Heart murmur   . Hypertension   . IBS (irritable bowel syndrome)   . Iron deficiency   . Osteoarthritis   . PONV (postoperative nausea and vomiting)   . Tremors of nervous system    essential tremors    Past Surgical History:  Procedure Laterality Date  . BREAST LUMPECTOMY WITH RADIOACTIVE SEED AND SENTINEL LYMPH NODE BIOPSY Right 01/08/2017   Procedure: RIGHT BREAST LUMPECTOMY WITH RADIOACTIVE SEED AND RIGHT SENTINEL LYMPH NODE BIOPSY ERAS PATHWAY;  Surgeon: Fanny Skates, MD;  Location: [place];  Service: General;  Laterality: Right;  PEC BLOCK  . LAPAROSCOPIC SIGMOID COLECTOMY    . LAPAROSCOPY    . PORTACATH PLACEMENT Left 01/08/2017   Procedure: INSERTION PORT-A-CATH WITH ULTRA SOUND;  Surgeon: Fanny Skates, MD;  Location: [place];  Service: General;  Laterality: Left;    There were no vitals filed for this visit.  Subjective Assessment - 04/16/17 1023    Subjective  I broke out this week in a rash and my hands were swollen. The right hand was worse than the left. My hand is still swollen. I see the doctor tomorrow.     Pertinent History  01/08/17- R  lumpectomy and SLNB (4 removed all negative), pt is currently taking chemo and herceptin, pt will require radiation once she completes radiation    Patient Stated Goals  get full range of motion back with no discomfort    Currently in Pain?  No/denies                      [facility] Adult PT Treatment/Exercise - 04/16/17 0001      Manual Therapy   Myofascial Release  To Rt anterior elbow at cording and to R axilla in area of cording -  no pops felt but cording much less visible and palpable at end of session                  PT Long Term Goals - 04/06/17 1244      PT LONG TERM GOAL #1   Title  Pt will report 75% improvement in feelings of tightness and discomfort from cording in R axilla during RUE movement    Time  4    Period  Weeks    Status  On-going      PT LONG TERM GOAL #2   Title  Pt will be independent in a home exercise program for continued strengthening and stretching  Time  4    Period  Weeks    Status  On-going      PT LONG TERM GOAL #3   Title  Pt will report a 50% improvement in cording from right axilla to wrist to allow improved comfort.    Time  4    Period  Weeks    Status  On-going            Plan - 04/16/17 1108    Clinical Impression Statement  Pt wanted to focus on myofascial for cording today. She has not had time to practice the Strength ABC program but plans to over this week. She felt improved range of motion and decreased tightness after todays session.     Rehab Potential  Good    Clinical Impairments Affecting Rehab Potential  pt to begin radiation after chemo    PT Frequency  2x / week    PT Duration  4 weeks    PT Treatment/Interventions  ADLs/Self Care Home Management;Therapeutic exercise;Therapeutic activities;Patient/family education;Manual techniques;Manual lymph drainage;Scar mobilization;Taping    PT Next Visit Plan  Review, if needed, any questions pt may have from Strength ABC Program. Then cont to focus on  myofascial release to cording at Rt axilla and anterior elbow.    PT Home Exercise Plan  supine dowel exercises and supine scapular series, Strength ABC    Consulted and Agree with Plan of Care  Patient       Patient will benefit from skilled therapeutic intervention in order to improve the following deficits and impairments:  Increased fascial restricitons, Decreased range of motion, Decreased strength, Decreased knowledge of precautions, Postural dysfunction  Visit Diagnosis: Stiffness of right shoulder, not elsewhere classified     Problem List Patient Active Problem List   Diagnosis Date Noted  . Genetic testing 03/08/2017  . Family history of kidney cancer   . Family history of thyroid cancer   . Carcinoma of lower-outer quadrant of right breast in female, estrogen receptor positive ([place]) 01/02/2017    Allyson Sabal [facility] 04/16/2017, 11:09 AM  [place], [place], [address] Phone: [phone]   Fax:  [phone]  Name: Katie Marquez MRN: [id number] Date of Birth: 03-Sep-1955  Manus Gunning, PT 04/16/17 11:09 AM

## 2017-04-17 ENCOUNTER — Inpatient Hospital Stay: Payer: 59

## 2017-04-17 ENCOUNTER — Ambulatory Visit: Payer: 59

## 2017-04-17 ENCOUNTER — Telehealth: Payer: Self-pay | Admitting: Oncology

## 2017-04-17 ENCOUNTER — Inpatient Hospital Stay (HOSPITAL_BASED_OUTPATIENT_CLINIC_OR_DEPARTMENT_OTHER): Payer: 59 | Admitting: Oncology

## 2017-04-17 ENCOUNTER — Other Ambulatory Visit: Payer: 59

## 2017-04-17 VITALS — BP 141/92 | HR 79 | Temp 98.4°F | Resp 18 | Ht 62.0 in | Wt 119.0 lb

## 2017-04-17 DIAGNOSIS — C50511 Malignant neoplasm of lower-outer quadrant of right female breast: Secondary | ICD-10-CM

## 2017-04-17 DIAGNOSIS — Z170 Estrogen receptor positive status [ER+]: Principal | ICD-10-CM

## 2017-04-17 DIAGNOSIS — Z8619 Personal history of other infectious and parasitic diseases: Secondary | ICD-10-CM

## 2017-04-17 DIAGNOSIS — M1990 Unspecified osteoarthritis, unspecified site: Secondary | ICD-10-CM

## 2017-04-17 DIAGNOSIS — R251 Tremor, unspecified: Secondary | ICD-10-CM | POA: Diagnosis not present

## 2017-04-17 DIAGNOSIS — M7989 Other specified soft tissue disorders: Secondary | ICD-10-CM | POA: Diagnosis not present

## 2017-04-17 DIAGNOSIS — R419 Unspecified symptoms and signs involving cognitive functions and awareness: Secondary | ICD-10-CM | POA: Diagnosis not present

## 2017-04-17 DIAGNOSIS — Z808 Family history of malignant neoplasm of other organs or systems: Secondary | ICD-10-CM

## 2017-04-17 DIAGNOSIS — R21 Rash and other nonspecific skin eruption: Secondary | ICD-10-CM

## 2017-04-17 DIAGNOSIS — Z79899 Other long term (current) drug therapy: Secondary | ICD-10-CM

## 2017-04-17 DIAGNOSIS — Z5112 Encounter for antineoplastic immunotherapy: Secondary | ICD-10-CM

## 2017-04-17 DIAGNOSIS — Z5111 Encounter for antineoplastic chemotherapy: Secondary | ICD-10-CM

## 2017-04-17 DIAGNOSIS — K1379 Other lesions of oral mucosa: Secondary | ICD-10-CM | POA: Diagnosis not present

## 2017-04-17 DIAGNOSIS — Z8051 Family history of malignant neoplasm of kidney: Secondary | ICD-10-CM

## 2017-04-17 LAB — CBC WITH DIFFERENTIAL (CANCER CENTER ONLY)
BASOS ABS: 0.1 10*3/uL (ref 0.0–0.1)
Basophils Relative: 2 %
EOS ABS: 0.3 10*3/uL (ref 0.0–0.5)
Eosinophils Relative: 5 %
HCT: 37 % (ref 34.8–46.6)
Hemoglobin: 12.6 g/dL (ref 11.6–15.9)
Lymphocytes Relative: 19 %
Lymphs Abs: 1.1 10*3/uL (ref 0.9–3.3)
MCH: 30.3 pg (ref 25.1–34.0)
MCHC: 34.1 g/dL (ref 31.5–36.0)
MCV: 88.9 fL (ref 79.5–101.0)
Monocytes Absolute: 0.7 10*3/uL (ref 0.1–0.9)
Monocytes Relative: 12 %
Neutro Abs: 3.5 10*3/uL (ref 1.5–6.5)
Neutrophils Relative %: 62 %
Platelet Count: 380 10*3/uL (ref 145–400)
RBC: 4.16 MIL/uL (ref 3.70–5.45)
RDW: 16.1 % — ABNORMAL HIGH (ref 11.2–14.5)
WBC: 5.7 10*3/uL (ref 3.9–10.3)

## 2017-04-17 LAB — CMP (CANCER CENTER ONLY)
ALT: 19 U/L (ref 0–55)
AST: 16 U/L (ref 5–34)
Albumin: 3.5 g/dL (ref 3.5–5.0)
Alkaline Phosphatase: 54 U/L (ref 40–150)
Anion gap: 7 (ref 3–11)
BILIRUBIN TOTAL: 0.5 mg/dL (ref 0.2–1.2)
BUN: 21 mg/dL (ref 7–26)
CO2: 27 mmol/L (ref 22–29)
CREATININE: 0.84 mg/dL (ref 0.60–1.10)
Calcium: 9.7 mg/dL (ref 8.4–10.4)
Chloride: 103 mmol/L (ref 98–109)
GFR, Est AFR Am: >60 mL/min (ref >60)
GFR, Estimated: >60 mL/min (ref >60)
Glucose, Bld: 114 mg/dL (ref 70–140)
Potassium: 4.3 mmol/L (ref 3.5–5.1)
Sodium: 137 mmol/L (ref 136–145)
TOTAL PROTEIN: 6.6 g/dL (ref 6.4–8.3)

## 2017-04-17 MED ORDER — DIPHENHYDRAMINE HCL 50 MG/ML IJ SOLN
INTRAMUSCULAR | Status: AC
Start: 2017-04-17 — End: ?
  Filled 2017-04-17: qty 1

## 2017-04-17 MED ORDER — PROCHLORPERAZINE EDISYLATE 5 MG/ML IJ SOLN
INTRAMUSCULAR | Status: AC
  Filled 2017-04-17: qty 2

## 2017-04-17 MED ORDER — PROCHLORPERAZINE EDISYLATE 5 MG/ML IJ SOLN
10.0000 mg | Freq: Once | INTRAMUSCULAR | Status: AC
  Administered 2017-04-17: 10 mg via INTRAVENOUS

## 2017-04-17 MED ORDER — HEPARIN SOD (PORK) LOCK FLUSH 100 UNIT/ML IV SOLN
500.0000 [IU] | Freq: Once | INTRAVENOUS | Status: AC | PRN
  Administered 2017-04-17: 500 [IU]
  Filled 2017-04-17: qty 5

## 2017-04-17 MED ORDER — SODIUM CHLORIDE 0.9% FLUSH
10.0000 mL | INTRAVENOUS | Status: DC | PRN
  Administered 2017-04-17: 10 mL
  Filled 2017-04-17: qty 10

## 2017-04-17 MED ORDER — DEXAMETHASONE SODIUM PHOSPHATE 10 MG/ML IJ SOLN
INTRAMUSCULAR | Status: AC
  Filled 2017-04-17: qty 1

## 2017-04-17 MED ORDER — SODIUM CHLORIDE 0.9 % IV SOLN
Freq: Once | INTRAVENOUS | Status: AC
  Administered 2017-04-17: 15:00:00 via INTRAVENOUS

## 2017-04-17 MED ORDER — DIPHENHYDRAMINE HCL 50 MG/ML IJ SOLN
25.0000 mg | Freq: Once | INTRAMUSCULAR | Status: AC
  Administered 2017-04-17: 25 mg via INTRAVENOUS

## 2017-04-17 MED ORDER — SODIUM CHLORIDE 0.9 % IV SOLN
80.0000 mg/m2 | Freq: Once | INTRAVENOUS | Status: AC
  Administered 2017-04-17: 120 mg via INTRAVENOUS
  Filled 2017-04-17: qty 20

## 2017-04-17 MED ORDER — SODIUM CHLORIDE 0.9 % IV SOLN
20.0000 mg | Freq: Once | INTRAVENOUS | Status: AC
  Administered 2017-04-17: 20 mg via INTRAVENOUS
  Filled 2017-04-17: qty 2

## 2017-04-17 MED ORDER — DEXAMETHASONE SODIUM PHOSPHATE 10 MG/ML IJ SOLN
4.0000 mg | Freq: Once | INTRAMUSCULAR | Status: AC
  Administered 2017-04-17: 4 mg via INTRAVENOUS

## 2017-04-17 MED ORDER — FAMOTIDINE IN NACL 20-0.9 MG/50ML-% IV SOLN: 20.0000 mg | Freq: Once | INTRAVENOUS | Status: DC

## 2017-04-17 NOTE — Telephone Encounter (Signed)
Patient declined AVS and calendar of upcoming may appointments.

## 2017-04-17 NOTE — Patient Instructions (Signed)
[facility] Discharge Instructions for Patients Receiving Chemotherapy  Today you received the following chemotherapy agents Paclitaxel; Herceptin  To help prevent nausea and vomiting after your treatment, we encourage you to take your nausea medication as directed  If you develop nausea and vomiting that is not controlled by your nausea medication, call the clinic.   BELOW ARE SYMPTOMS THAT SHOULD BE REPORTED IMMEDIATELY:  *FEVER GREATER THAN 100.5 F  *CHILLS WITH OR WITHOUT FEVER  NAUSEA AND VOMITING THAT IS NOT CONTROLLED WITH YOUR NAUSEA MEDICATION  *UNUSUAL SHORTNESS OF BREATH  *UNUSUAL BRUISING OR BLEEDING  TENDERNESS IN MOUTH AND THROAT WITH OR WITHOUT PRESENCE OF ULCERS  *URINARY PROBLEMS  *BOWEL PROBLEMS  UNUSUAL RASH Items with * indicate a potential emergency and should be followed up as soon as possible.  Feel free to call the clinic should you have any questions or concerns. The clinic phone number is [phone].  Please show the [place] at check-in to the Emergency Department and triage nurse.

## 2017-04-18 ENCOUNTER — Ambulatory Visit: Payer: 59

## 2017-04-18 DIAGNOSIS — M25611 Stiffness of right shoulder, not elsewhere classified: Secondary | ICD-10-CM | POA: Diagnosis not present

## 2017-04-18 NOTE — Therapy (Signed)
[place], [place], [address] Phone: [phone]   Fax:  [phone]  Physical Therapy Treatment  Patient Details  Name: Katie Marquez MRN: [id number] Date of Birth: 1955/10/06 Referring Provider: Dr. Dalbert Batman   Encounter Date: 04/18/2017  PT End of Session - 04/18/17 1103    Visit Number  8    Number of Visits  9    Date for PT Re-Evaluation  04/20/17    PT Start Time  1016    PT Stop Time  1102    PT Time Calculation (min)  46 min    Activity Tolerance  Patient tolerated treatment well    Behavior During Therapy  [facility] for tasks assessed/performed       Past Medical History:  Diagnosis Date  . Anxiety   . Cancer [facility])    breast cancer  . Diverticulosis   . Family history of kidney cancer   . Family history of thyroid cancer   . Heart murmur   . Hypertension   . IBS (irritable bowel syndrome)   . Iron deficiency   . Osteoarthritis   . PONV (postoperative nausea and vomiting)   . Tremors of nervous system    essential tremors    Past Surgical History:  Procedure Laterality Date  . BREAST LUMPECTOMY WITH RADIOACTIVE SEED AND SENTINEL LYMPH NODE BIOPSY Right 01/08/2017   Procedure: RIGHT BREAST LUMPECTOMY WITH RADIOACTIVE SEED AND RIGHT SENTINEL LYMPH NODE BIOPSY ERAS PATHWAY;  Surgeon: Fanny Skates, MD;  Location: [place];  Service: General;  Laterality: Right;  PEC BLOCK  . LAPAROSCOPIC SIGMOID COLECTOMY    . LAPAROSCOPY    . PORTACATH PLACEMENT Left 01/08/2017   Procedure: INSERTION PORT-A-CATH WITH ULTRA SOUND;  Surgeon: Fanny Skates, MD;  Location: [place];  Service: General;  Laterality: Left;    There were no vitals filed for this visit.  Subjective Assessment - 04/18/17 1019    Subjective  The rash is still there but getting better. The swelling has finally gona away but it took a few days.     Pertinent History  01/08/17- R lumpectomy and SLNB (4 removed all negative), pt is  currently taking chemo and herceptin, pt will require radiation once she completes radiation    Patient Stated Goals  get full range of motion back with no discomfort    Currently in Pain?  No/denies                      [facility] Adult PT Treatment/Exercise - 04/18/17 0001      Manual Therapy   Myofascial Release  To Rt anterior elbow at cording and to Rt axilla in area of cording -  1 pop felt at anterior elbow and cording much less visible and palpable at end of session                  PT Long Term Goals - 04/06/17 1244      PT LONG TERM GOAL #1   Title  Pt will report 75% improvement in feelings of tightness and discomfort from cording in R axilla during RUE movement    Time  4    Period  Weeks    Status  On-going      PT LONG TERM GOAL #2   Title  Pt will be independent in a home exercise program for continued strengthening and stretching    Time  4    Period  Weeks    Status  On-going      PT LONG TERM GOAL #3   Title  Pt will report a 50% improvement in cording from right axilla to wrist to allow improved comfort.    Time  4    Period  Weeks    Status  On-going            Plan - 04/18/17 1252    Clinical Impression Statement  Continued with focus on manual therapy and myofascial release to Rt axilla. Felt one cord "pop" during stretching in anterior elbow. Pts cording has significantly improved overall and she reports feeling it less limiting at end ROM reaching with ADLs. She will be ready for D/C next week.     Rehab Potential  Good    Clinical Impairments Affecting Rehab Potential  pt to begin radiation after chemo    PT Frequency  2x / week    PT Duration  4 weeks    PT Treatment/Interventions  ADLs/Self Care Home Management;Therapeutic exercise;Therapeutic activities;Patient/family education;Manual techniques;Manual lymph drainage;Scar mobilization;Taping    PT Next Visit Plan  D/C next visit; review goals and cont manual therapy to  cording.     Consulted and Agree with Plan of Care  Patient       Patient will benefit from skilled therapeutic intervention in order to improve the following deficits and impairments:  Increased fascial restricitons, Decreased range of motion, Decreased strength, Decreased knowledge of precautions, Postural dysfunction  Visit Diagnosis: Stiffness of right shoulder, not elsewhere classified     Problem List Patient Active Problem List   Diagnosis Date Noted  . Genetic testing 03/08/2017  . Family history of kidney cancer   . Family history of thyroid cancer   . 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

## 2017-04-19 ENCOUNTER — Ambulatory Visit (HOSPITAL_COMMUNITY)
Admission: RE | Admit: 2017-04-19 | Discharge: 2017-04-19 | Disposition: A | Discharge status: Home / Self Care | Payer: 59 | Source: Ambulatory Visit | Attending: Family Medicine | Admitting: Family Medicine

## 2017-04-19 ENCOUNTER — Ambulatory Visit (HOSPITAL_BASED_OUTPATIENT_CLINIC_OR_DEPARTMENT_OTHER)
Admission: RE | Admit: 2017-04-19 | Discharge: 2017-04-19 | Disposition: A | Discharge status: Home / Self Care | Payer: 59 | Source: Ambulatory Visit | Attending: Cardiology | Admitting: Cardiology

## 2017-04-19 ENCOUNTER — Encounter (HOSPITAL_COMMUNITY): Payer: Self-pay | Admitting: Cardiology

## 2017-04-19 VITALS — BP 156/87 | HR 81 | Ht 62.0 in | Wt 119.0 lb

## 2017-04-19 DIAGNOSIS — C50511 Malignant neoplasm of lower-outer quadrant of right female breast: Secondary | ICD-10-CM | POA: Diagnosis not present

## 2017-04-19 DIAGNOSIS — Z808 Family history of malignant neoplasm of other organs or systems: Secondary | ICD-10-CM | POA: Insufficient documentation

## 2017-04-19 DIAGNOSIS — Z79899 Other long term (current) drug therapy: Secondary | ICD-10-CM | POA: Diagnosis not present

## 2017-04-19 DIAGNOSIS — K589 Irritable bowel syndrome without diarrhea: Secondary | ICD-10-CM | POA: Diagnosis not present

## 2017-04-19 DIAGNOSIS — R931 Abnormal findings on diagnostic imaging of heart and coronary circulation: Secondary | ICD-10-CM | POA: Insufficient documentation

## 2017-04-19 DIAGNOSIS — Z170 Estrogen receptor positive status [ER+]: Secondary | ICD-10-CM | POA: Diagnosis not present

## 2017-04-19 DIAGNOSIS — I313 Pericardial effusion (noninflammatory): Secondary | ICD-10-CM | POA: Insufficient documentation

## 2017-04-19 DIAGNOSIS — I10 Essential (primary) hypertension: Secondary | ICD-10-CM | POA: Diagnosis not present

## 2017-04-19 DIAGNOSIS — R011 Cardiac murmur, unspecified: Secondary | ICD-10-CM | POA: Diagnosis not present

## 2017-04-19 NOTE — Progress Notes (Signed)
  Echocardiogram 2D Echocardiogram has been performed.  Katie Marquez 04/19/2017, 10:57 AM

## 2017-04-19 NOTE — Progress Notes (Signed)
Oncology: Dr. Jana Hakim  62 yo with history of HTN and breast cancer was referred by Dr. Jana Hakim for cardio-oncology evaluation.    Right breast cancer was diagnosed 11/18, ER+/PR+/HER2+.  She had lumpectomy in 12/18.  She has completed paclitaxel x 12 cycles.  She will continue trastuzumab x 1 year.  She will have radiation.    She had an episode of syncope earlier this year in the setting of drinking wine and sitting by a hot fire. No palpitations.  She had a prodrome of warmth and lightheadedness.  She was unconscious only briefly.  At the time, Dr. Jana Hakim decreased her lisinopril to 10 mg daily but subsequently she increased it back to 20 mg daily as BP has been running high. It is high in the office today.    No exertional dyspnea or chest pain.  No history of cardiac problems.  Her mother died of CHF at 26. She has never smoked.   PMH: 1. Tremor 2. IBS 3. HTN 4. Breast cancer: Right breast cancer diagnosed 11/18, ER+/PR+/HER2+.  She had lumpectomy in 12/18.  She has completed paclitaxel x 12 cycles.  She will continue trastuzumab x 1 year.  She will have radiation.  - Echo (3/19): EF 60-65%, normal wall thickness.  GLS -24.9%.   Social History   Socioeconomic History  . Marital status: Married    Spouse name: Not on file  . Number of children: Not on file  . Years of education: Not on file  . Highest education level: Not on file  Occupational History  . Not on file  Social Needs  . Financial resource strain: Not on file  . Food insecurity:    Worry: Not on file    Inability: Not on file  . Transportation needs:    Medical: Not on file    Non-medical: Not on file  Tobacco Use  . Smoking status: Never Smoker  . Smokeless tobacco: Never Used  Substance and Sexual Activity  . Alcohol use: Yes    Frequency: Never    Comment: occas  . Drug use: No  . Sexual activity: Not on file  Lifestyle  . Physical activity:    Days per week: Not on file    Minutes per session: Not  on file  . Stress: Not on file  Relationships  . Social connections:    Talks on phone: Not on file    Gets together: Not on file    Attends religious service: Not on file    Active member of club or organization: Not on file    Attends meetings of clubs or organizations: Not on file    Relationship status: Not on file  . Intimate partner violence:    Fear of current or ex partner: Not on file    Emotionally abused: Not on file    Physically abused: Not on file    Forced sexual activity: Not on file  Other Topics Concern  . Not on file  Social History Narrative  . Not on file   Family History  Problem Relation Age of Onset  . Rheum arthritis Mother   . Hyperlipidemia Mother   . Hypertension Mother   . Thyroid cancer Mother 51  . Diverticulitis Father   . Parkinson's disease Father   . Kidney cancer Father 54  . Multiple sclerosis Maternal Aunt   . Breast cancer Neg Hx    ROS: All systems reviewed and negative except as per HPI.   Current Outpatient  Medications  Medication Sig Dispense Refill  . CALCIUM PO Take by mouth daily.    . cholecalciferol (VITAMIN D) 1000 units tablet Take 2,000 Units by mouth daily.    Marland Kitchen FLUoxetine (PROZAC) 10 MG capsule Take 20 mg by mouth daily.     Marland Kitchen gabapentin (NEURONTIN) 300 MG capsule Take 1 capsule (300 mg total) by mouth at bedtime. 90 capsule 4  . lisinopril (PRINIVIL,ZESTRIL) 10 MG tablet Take 1 tablet (10 mg total) by mouth daily. 30 tablet 3  . Probiotic Product (PROBIOTIC-10 PO) Take by mouth.    . Red Yeast Rice 600 MG CAPS Take by mouth.    . valACYclovir (VALTREX) 1000 MG tablet Take 1 tablet (1,000 mg total) by mouth daily. 30 tablet 0   No current facility-administered medications for this encounter.    BP (!) 156/87   Pulse 81   Ht _0  (1.575 m)   Wt 119 lb (54 kg)   SpO2 100%   BMI 21.77 kg/m  General: NAD Neck: No JVD, no thyromegaly or thyroid nodule.  Lungs: Clear to auscultation bilaterally with normal  respiratory effort. CV: Nondisplaced PMI.  Heart regular S1/S2, no S3/S4, 1/6 SEM RUSB.  No peripheral edema.  No carotid bruit.  Normal pedal pulses.  Abdomen: Soft, nontender, no hepatosplenomegaly, no distention.  Skin: Intact without lesions or rashes.  Neurologic: Alert and oriented x 3.  Psych: Normal affect. Extremities: No clubbing or cyanosis.  HEENT: Normal.   Assessment/Plan: 1. HTN: BP is high today. The syncopal episode she describes from earlier this year sounds vasovagal rather than orthostatic.   - Continue lisinopril 20 mg daily for now.  If BP continues to run high, I will have her increase it to 40 mg daily.  2. Breast cancer: We discussed the cardiac risk of Herceptin and the rationale behind echo screening.  I reviewed today's echo: normal EF and strain pattern.   - She can continue Herceptin as scheduled and we will repeat an echo in 3 months.  3. Abnormal echo: Echo from 12/18 describes moderate focal basal septal hypertrophy with mitral valve SAM, which suggests hypertrophic cardiomyopathy, and a supracristal VSD was reported. Today's echo does not show LVH or mitral valve SAM, and no VSD is visualized. She also has a very minimal murmur on exam which does not suggest a VSD or HCM with LVOT obstruction.  I do not think that she has significant abnormalities but will continue to follow her echoes closely while she is on Herceptin.   Loralie Champagne 04/19/2017

## 2017-04-19 NOTE — Progress Notes (Signed)
Location of Breast Cancer: Right Breast  Histology per Pathology Report: 12/15/16  Diagnosis Breast, right, needle core biopsy, 7:00 o'clock - INVASIVE DUCTAL CARCINOMA - DUCTAL CARCINOMA IN SITU  Receptor Status: ER(95%), PR (50%), Her2-neu (POS), Ki-(15%)  01/08/17 Diagnosis 1. Breast, lumpectomy, Right - INVASIVE DUCTAL CARCINOMA, GRADE II/III, SPANNING 1.1 CM. - DUCTAL CARCINOMA IN SITU, HIGH GRADE. - INVASIVE CARCINOMA IS FOCALLY PRESENT AT THE ANTERIOR MARGIN. - DUCTAL CARCINOMA IN SITU IS FOCALLY PRESENT AT THE ANTERIOR MARGIN - SEE ONCOLOGY TABLE BELOW. 2. Lymph node, sentinel, biopsy, Right axillary - THERE IS NO EVIDENCE OF CARCINOMA IN 1 OF 1 LYMPH NODE (0/1). 3. Lymph node, sentinel, biopsy, Right axillary - THERE IS NO EVIDENCE OF CARCINOMA IN 1 OF 1 LYMPH NODE (0/1). 4. Lymph node, sentinel, biopsy, Right axillary - THERE IS NO EVIDENCE OF CARCINOMA IN 1 OF 1 LYMPH NODE (0/1). 5. Lymph node, sentinel, biopsy, Right axillary - THERE IS NO EVIDENCE OF CARCINOMA IN 1 OF 1 LYMPH NODE (0/1).  Did patient present with symptoms or was this found on screening mammography?: It was found on a screeening mammogram.   Past/Anticipated interventions by surgeon, if any: 01/08/17 Procedure:                 Insertion of 8 French power port clear Vue tunneled venous vascular access device                                      Use of fluoroscopy for guidance and positioning                                       Inject blue dye right breast                                       Right breast lumpectomy with radioactive seed localization                                       Right axillary deep sentinel lymph node biopsy Surgeon:                     Edsel Petrin. Dalbert Batman, M.D., [facility]    Past/Anticipated interventions by medical oncology, if any:  04/17/17 Dr. Jana Hakim (1) status post right lumpectomy 01/08/2017 for a  pT1c pN0, stage IA invasive ductal carcinoma, grade 2, with  negative margins.   (2) adjuvant chemotherapy consisting of paclitaxel weekly x12, together with trastuzumab every 21 days stared 01/31/2017 (3) trastuzumab to be continued to total 1 year (through December 2019)             (a) echocardiogram 01/17/2017 demonstrates a LVEF of 65-70%             (b) echocardiogram 04/19/2017 pending (4) adjuvant radiation planned (5) antiestrogens to follow at the completion of local treatment  -Her next Herceptin infusion is today.    Lymphedema issues, if any: She has completed PT yesterday (3/25) for cording.   Pain issues, if any: She denies  SAFETY ISSUES:  Prior radiation? No  Pacemaker/ICD? No  Possible current pregnancy? No  Is the patient  on methotrexate? No   Current Complaints / other details:    BP 129/81   Pulse 78   Temp 98.5 F (36.9 C)   Ht '5\' 2"'$  (1.575 m)   Wt 118 lb 3.2 oz (53.6 kg)   SpO2 98% Comment: room air  BMI 21.62 kg/m    Wt Readings from Last 3 Encounters:  04/24/17 118 lb 3.2 oz (53.6 kg)  04/19/17 119 lb (54 kg)  04/17/17 119 lb (54 kg)

## 2017-04-19 NOTE — Patient Instructions (Signed)
Your physician has requested that you have an echocardiogram. Echocardiography is a painless test that uses sound waves to create images of your heart. It provides your doctor with information about the size and shape of your heart and how well your heart's chambers and valves are working. This procedure takes approximately one hour. There are no restrictions for this procedure.  Your physician recommends that you schedule a follow-up appointment in: 3 months with Dr. McLean  an a echocardiogram    

## 2017-04-23 ENCOUNTER — Encounter: Payer: Self-pay | Admitting: Physical Therapy

## 2017-04-23 ENCOUNTER — Ambulatory Visit: Payer: 59 | Admitting: Physical Therapy

## 2017-04-23 DIAGNOSIS — M25611 Stiffness of right shoulder, not elsewhere classified: Secondary | ICD-10-CM | POA: Diagnosis not present

## 2017-04-23 NOTE — Therapy (Signed)
[place], [place], [address] Phone: [phone]   Fax:  [phone]  Physical Therapy Treatment  Patient Details  Name: Katie Marquez MRN: [id number] Date of Birth: 1955-11-13 Referring Provider: Dr. Dalbert Batman   Encounter Date: 04/23/2017  PT End of Session - 04/23/17 1204    Visit Number  9    Number of Visits  9    Date for PT Re-Evaluation  04/20/17    PT Start Time  0933    PT Stop Time  1015    PT Time Calculation (min)  42 min    Activity Tolerance  Patient tolerated treatment well    Behavior During Therapy  [facility] for tasks assessed/performed       Past Medical History:  Diagnosis Date  . Anxiety   . Cancer [facility])    breast cancer  . Diverticulosis   . Family history of kidney cancer   . Family history of thyroid cancer   . Heart murmur   . Hypertension   . IBS (irritable bowel syndrome)   . Iron deficiency   . Osteoarthritis   . PONV (postoperative nausea and vomiting)   . Tremors of nervous system    essential tremors    Past Surgical History:  Procedure Laterality Date  . BREAST LUMPECTOMY WITH RADIOACTIVE SEED AND SENTINEL LYMPH NODE BIOPSY Right 01/08/2017   Procedure: RIGHT BREAST LUMPECTOMY WITH RADIOACTIVE SEED AND RIGHT SENTINEL LYMPH NODE BIOPSY ERAS PATHWAY;  Surgeon: Fanny Skates, MD;  Location: [place];  Service: General;  Laterality: Right;  PEC BLOCK  . LAPAROSCOPIC SIGMOID COLECTOMY    . LAPAROSCOPY    . PORTACATH PLACEMENT Left 01/08/2017   Procedure: INSERTION PORT-A-CATH WITH ULTRA SOUND;  Surgeon: Fanny Skates, MD;  Location: [place];  Service: General;  Laterality: Left;    There were no vitals filed for this visit.  Subjective Assessment - 04/23/17 0935    Subjective  I finished the chemo last week. I start radiation soon.     Pertinent History  01/08/17- R lumpectomy and SLNB (4 removed all negative), pt is currently taking chemo and herceptin, pt will  require radiation once she completes radiation    Patient Stated Goals  get full range of motion back with no discomfort    Currently in Pain?  No/denies                No data recorded       [facility] Adult PT Treatment/Exercise - 04/23/17 0001      Manual Therapy   Myofascial Release  To Rt anterior elbow at cording and to Rt axilla in area of cording , cording much less visible and palpable at end of session                  PT Long Term Goals - 04/23/17 0936      PT LONG TERM GOAL #1   Title  Pt will report 75% improvement in feelings of tightness and discomfort from cording in R axilla during RUE movement    Baseline  04/23/17- 75% improvement    Time  4    Period  Weeks    Status  Achieved      PT LONG TERM GOAL #2   Title  Pt will be independent in a home exercise program for continued strengthening and stretching    Baseline  04/23/17- pt reports she is independent with this    Time  4    Period  Weeks    Status  Achieved      PT LONG TERM GOAL #3   Title  Pt will report a 50% improvement in cording from right axilla to wrist to allow improved comfort.    Baseline  04/23/17- 50% improvement    Time  4    Period  Weeks    Status  Achieved            Plan - 04/23/17 1204    Clinical Impression Statement  Assessed pt's progress towards goals in therapy. She has now met all goals for therapy and feels her cording is much improved since time of evaluation. Pt will be beginning radiation next week and was encouraged to continue stretching throughout radiation to avoid further tightening of cording. Pt will be discharged from skilled PT services at this time.     Rehab Potential  Good    Clinical Impairments Affecting Rehab Potential  pt to begin radiation after chemo    PT Frequency  2x / week    PT Duration  4 weeks    PT Treatment/Interventions  ADLs/Self Care Home Management;Therapeutic exercise;Therapeutic activities;Patient/family  education;Manual techniques;Manual lymph drainage;Scar mobilization;Taping    PT Next Visit Plan  dc this vist    PT Home Exercise Plan  supine dowel exercises and supine scapular series, Strength ABC    Consulted and Agree with Plan of Care  Patient       Patient will benefit from skilled therapeutic intervention in order to improve the following deficits and impairments:  Increased fascial restricitons, Decreased range of motion, Decreased strength, Decreased knowledge of precautions, Postural dysfunction  Visit Diagnosis: Stiffness of right shoulder, not elsewhere classified     Problem List Patient Active Problem List   Diagnosis Date Noted  . Genetic testing 03/08/2017  . Family history of kidney cancer   . Family history of thyroid cancer   . 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  Patient goals were met. Patient is being discharged due to meeting the stated rehab goals.  ?????    Allyson Sabal [place], [place] 04/23/17 12:06 PM

## 2017-04-24 ENCOUNTER — Other Ambulatory Visit: Payer: Self-pay

## 2017-04-24 ENCOUNTER — Encounter: Payer: Self-pay | Admitting: Radiation Oncology

## 2017-04-24 ENCOUNTER — Ambulatory Visit
Admission: RE | Admit: 2017-04-24 | Discharge: 2017-04-24 | Disposition: A | Discharge status: Home / Self Care | Payer: 59 | Source: Ambulatory Visit | Attending: Radiation Oncology | Admitting: Radiation Oncology

## 2017-04-24 ENCOUNTER — Inpatient Hospital Stay: Payer: 59

## 2017-04-24 VITALS — BP 129/81 | HR 78 | Temp 98.5°F | Ht 62.0 in | Wt 118.2 lb

## 2017-04-24 VITALS — BP 125/81 | HR 76 | Temp 98.4°F | Resp 18

## 2017-04-24 DIAGNOSIS — C50511 Malignant neoplasm of lower-outer quadrant of right female breast: Secondary | ICD-10-CM

## 2017-04-24 DIAGNOSIS — Z510 Encounter for antineoplastic radiation therapy: Secondary | ICD-10-CM | POA: Diagnosis not present

## 2017-04-24 DIAGNOSIS — Z170 Estrogen receptor positive status [ER+]: Secondary | ICD-10-CM | POA: Insufficient documentation

## 2017-04-24 DIAGNOSIS — Z9889 Other specified postprocedural states: Secondary | ICD-10-CM | POA: Diagnosis not present

## 2017-04-24 LAB — CMP (CANCER CENTER ONLY)
ALK PHOS: 54 U/L (ref 40–150)
ALT: 20 U/L (ref 0–55)
ANION GAP: 8 (ref 3–11)
AST: 17 U/L (ref 5–34)
Albumin: 3.4 g/dL — ABNORMAL LOW (ref 3.5–5.0)
BILIRUBIN TOTAL: 0.4 mg/dL (ref 0.2–1.2)
BUN: 23 mg/dL (ref 7–26)
CALCIUM: 9.6 mg/dL (ref 8.4–10.4)
CO2: 26 mmol/L (ref 22–29)
Chloride: 103 mmol/L (ref 98–109)
Creatinine: 0.83 mg/dL (ref 0.60–1.10)
Glucose, Bld: 106 mg/dL (ref 70–140)
Potassium: 4.3 mmol/L (ref 3.5–5.1)
SODIUM: 137 mmol/L (ref 136–145)
TOTAL PROTEIN: 6.3 g/dL — AB (ref 6.4–8.3)

## 2017-04-24 LAB — CBC WITH DIFFERENTIAL (CANCER CENTER ONLY)
BASOS ABS: 0.1 10*3/uL (ref 0.0–0.1)
BASOS PCT: 1 %
Eosinophils Absolute: 0.3 10*3/uL (ref 0.0–0.5)
Eosinophils Relative: 5 %
HEMATOCRIT: 36.2 % (ref 34.8–46.6)
HEMOGLOBIN: 12.5 g/dL (ref 11.6–15.9)
Lymphocytes Relative: 20 %
Lymphs Abs: 1 10*3/uL (ref 0.9–3.3)
MCH: 30.8 pg (ref 25.1–34.0)
MCHC: 34.5 g/dL (ref 31.5–36.0)
MCV: 89.4 fL (ref 79.5–101.0)
Monocytes Absolute: 0.6 10*3/uL (ref 0.1–0.9)
Monocytes Relative: 10 %
NEUTROS PCT: 64 %
Neutro Abs: 3.3 10*3/uL (ref 1.5–6.5)
Platelet Count: 355 10*3/uL (ref 145–400)
RBC: 4.05 MIL/uL (ref 3.70–5.45)
RDW: 16.3 % — ABNORMAL HIGH (ref 11.2–14.5)
WBC: 5.3 10*3/uL (ref 3.9–10.3)

## 2017-04-24 MED ORDER — ACETAMINOPHEN 325 MG PO TABS
ORAL_TABLET | ORAL | Status: AC
  Filled 2017-04-24: qty 2

## 2017-04-24 MED ORDER — ACETAMINOPHEN 325 MG PO TABS
650.0000 mg | ORAL_TABLET | Freq: Once | ORAL | Status: AC
  Administered 2017-04-24: 650 mg via ORAL

## 2017-04-24 MED ORDER — SODIUM CHLORIDE 0.9 % IV SOLN
Freq: Once | INTRAVENOUS | Status: AC
  Administered 2017-04-24: 13:00:00 via INTRAVENOUS

## 2017-04-24 MED ORDER — SODIUM CHLORIDE 0.9% FLUSH
10.0000 mL | INTRAVENOUS | Status: DC | PRN
  Administered 2017-04-24: 10 mL
  Filled 2017-04-24: qty 10

## 2017-04-24 MED ORDER — TRASTUZUMAB CHEMO 150 MG IV SOLR
300.0000 mg | Freq: Once | INTRAVENOUS | Status: AC
  Administered 2017-04-24: 300 mg via INTRAVENOUS
  Filled 2017-04-24: qty 14.29

## 2017-04-24 MED ORDER — HEPARIN SOD (PORK) LOCK FLUSH 100 UNIT/ML IV SOLN
500.0000 [IU] | Freq: Once | INTRAVENOUS | Status: AC | PRN
  Administered 2017-04-24: 500 [IU]
  Filled 2017-04-24: qty 5

## 2017-04-24 NOTE — Patient Instructions (Signed)
[facility] Discharge Instructions for Patients Receiving Chemotherapy  Today you received the following chemotherapy agents Herceptin  To help prevent nausea and vomiting after your treatment, we encourage you to take your nausea medication as directed   If you develop nausea and vomiting that is not controlled by your nausea medication, call the clinic.   BELOW ARE SYMPTOMS THAT SHOULD BE REPORTED IMMEDIATELY:  *FEVER GREATER THAN 100.5 F  *CHILLS WITH OR WITHOUT FEVER  NAUSEA AND VOMITING THAT IS NOT CONTROLLED WITH YOUR NAUSEA MEDICATION  *UNUSUAL SHORTNESS OF BREATH  *UNUSUAL BRUISING OR BLEEDING  TENDERNESS IN MOUTH AND THROAT WITH OR WITHOUT PRESENCE OF ULCERS  *URINARY PROBLEMS  *BOWEL PROBLEMS  UNUSUAL RASH Items with * indicate a potential emergency and should be followed up as soon as possible.  Feel free to call the clinic should you have any questions or concerns. The clinic phone number is [phone].  Please show the CHEMO ALERT CARD at check-in to the Emergency Department and triage nurse.   

## 2017-04-24 NOTE — Progress Notes (Signed)
  Radiation Oncology         [phone] ________________________________  Name: Katie Marquez MRN: [id number]  Date: 04/24/2017  DOB: 1955/05/13  SIMULATION AND TREATMENT PLANNING NOTE    Outpatient  DIAGNOSIS:     ICD-10-CM   1. 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  Identity was confirmed.  All relevant records and images related to the planned course of therapy were reviewed.  The patient freely provided informed written consent to proceed with treatment after reviewing the details related to the planned course of therapy. The consent form was witnessed and verified by the simulation staff.    Then, the patient was set-up in a stable reproducible supine position for radiation therapy with her ipsilateral arm over her head, and her upper body secured in a custom-made Vac-lok device.  CT images were obtained.  Surface markings were placed.  The CT images were loaded into the planning software.    TREATMENT PLANNING NOTE: Treatment planning then occurred.  The radiation prescription was entered and confirmed.     A total of 3 medically necessary complex treatment devices were fabricated and supervised by me: 2 fields with MLCs for custom blocks to protect heart, and lungs;  and, a Vac-lok. MORE COMPLEX DEVICES MAY BE MADE IN DOSIMETRY FOR FIELD IN FIELD BEAMS FOR DOSE HOMOGENEITY.  I have requested : 3D Simulation which is medically necessary to give adequate dose to at risk tissues while sparing lungs and heart.  I have requested a DVH of the following structures: lungs, heart, lumpectomy cavity.    The patient will receive 40.05 Gy in 15 fractions to the right breast with 2 tangential fields.   This will be followed by a boost.  Optical Surface Tracking Plan:  Since intensity modulated radiotherapy (IMRT) and 3D conformal radiation  treatment methods are predicated on accurate and precise positioning for treatment, intrafraction motion monitoring is medically necessary to ensure accurate and safe treatment delivery. The ability to quantify intrafraction motion without excessive ionizing radiation dose can only be performed with optical surface tracking. Accordingly, surface imaging offers the opportunity to obtain 3D measurements of patient position throughout IMRT and 3D treatments without excessive radiation exposure. I am ordering optical surface tracking for this patient's upcoming course of radiotherapy.  ________________________________   Reference:  Ursula Alert, J, et al. Surface imaging-based analysis of intrafraction motion for breast radiotherapy patients.Journal of [place] II, n. 6, nov. 2014. ISSN [id number].  Available at: <http://www.jacmp.org/index.php/jacmp/article/view/4957>.    -----------------------------------  Eppie Gibson, MD

## 2017-04-24 NOTE — Progress Notes (Signed)
Radiation Oncology         [phone] ________________________________  Name: Katie Marquez MRN: [id number]  Date: 04/24/2017  DOB: 11-11-1955  Follow-Up Visit Note  Outpatient  CC: Jonathon Jordan, MD  Magrinat, Virgie Dad, MD  Diagnosis:      ICD-10-CM   1. 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     Since consultation, she underwent right breast lumpectomy with sentinel lymph node biopsy by Dr. Dalbert Batman on 01/08/2017. Pathology revealed grade 2 invasive ductal carcinoma, spanning 1.1 cm, and high grade DCIS. Invasive carcinoma and DCIS were focally present at the anterior margin. Although her anterior margin is focally positive, the anterior margin is skin, and Dr. Dalbert Batman did not recommend further excision. Four right axillary lymph nodes biopsied were all negative. Her disease is ER 95%, PR 50%, Her2 positive, and Ki-67 15%.   She has received adjuvant chemotherapy under the care of Dr. Jana Hakim consisting of paclitaxel weekly x12, in combination with trastuzumab every 21 days, started on 01/31/2017. Paclitaxel was completed on 04/17/2017. She will continue on trastuzumab through December 2019.   The patient has been referred back today for further discussion of adjuvant radiation treatment. She is accompanied by her husband. She reports lymphedema issues following surgery. She completed PT yesterday (3/25) for cording in arm. She denies any pain. 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  Energy is fairly good, and she works out on treadmill; would like to swim soon. She reports easy tanning in the sun, but sun rash recently on  hands.           ALLERGIES:  has No Known Allergies.  Meds: Current Outpatient Medications  Medication Sig Dispense Refill  . CALCIUM PO Take by mouth daily.    . cholecalciferol (VITAMIN D) 1000 units tablet Take 2,000 Units by mouth daily.    Marland Kitchen FLUoxetine (PROZAC) 10 MG capsule Take 20 mg by mouth daily.     Marland Kitchen lisinopril (PRINIVIL,ZESTRIL) 10 MG tablet Take 1 tablet (10 mg total) by mouth daily. 30 tablet 3  . Probiotic Product (PROBIOTIC-10 PO) Take by mouth.    . Red Yeast Rice 600 MG CAPS Take by mouth.    . gabapentin (NEURONTIN) 300 MG capsule Take 1 capsule (300 mg total) by mouth at bedtime. (Patient not taking: Reported on 04/24/2017) 90 capsule 4  . valACYclovir (VALTREX) 1000 MG tablet Take 1 tablet (1,000 mg total) by mouth daily. (Patient not taking: Reported on 04/24/2017) 30 tablet 0   No current facility-administered medications for this encounter.    Facility-Administered Medications Ordered in Other Encounters  Medication Dose Route Frequency Provider Last Rate Last Dose  . heparin lock flush 100 unit/mL  500 Units Intracatheter Once PRN Magrinat, Virgie Dad, MD      . sodium chloride flush (NS) 0.9 % injection 10 mL  10 mL Intracatheter PRN Magrinat, Virgie Dad, MD      . trastuzumab (HERCEPTIN) 300 mg in sodium chloride 0.9 % 250 mL chemo infusion  300 mg Intravenous Once Magrinat, Virgie Dad, MD   Stopped at 04/24/17 1620  Physical Findings:  height is 5' 2" (1.575 m) and weight is 118 lb 3.2 oz (53.6 kg). Her temperature is 98.5 F (36.9 C). Her blood pressure is 129/81 and her pulse is 78. Her oxygen saturation is 98%.      General: Alert and oriented, in no acute distress. HEENT: Oropharynx is clear. Neck: Neck is supple, no palpable cervical or supraclavicular lymphadenopathy. Heart: Regular in rate and rhythm with no murmurs, rubs, or gallops. Chest: Clear to auscultation bilaterally, with no rhonchi, wheezes, or rales. Abdomen: Soft, nontender, nondistended,  with no rigidity or guarding. Extremities: No cyanosis or edema. Lymphatics: see Neck Exam Musculoskeletal: Good ROM in right shoulder. Neurologic: No obvious focalities. Speech is fluent.  Psychiatric: Judgment and insight are intact. Affect is appropriate. Breast exam: Her lumpectomy and axillary scars are healing well on the right. Her left breast does not have any palpable masses.   Lab Findings: Lab Results  Component Value Date   WBC 5.3 04/24/2017   HGB 14.4 01/31/2017   HCT 36.2 04/24/2017   MCV 89.4 04/24/2017   PLT 355 04/24/2017      Radiographic Findings: No results found.  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  I recommend treating the right breast over 4 wks in order to reduce risk of local recurrence by 2/3.  The risks, benefits and side effects of this treatment were discussed in detail.  She understands that radiotherapy is associated with skin irritation and fatigue in the acute setting. Late effects can include cosmetic changes and rare injury to internal organs.   She is enthusiastic about proceeding with treatment. A consent form has been signed and placed in her chart. She is scheduled for CT simulation today. We will begin treatment on April 10th to give her at least 3 weeks to heal from chemotherapy before starting radiation.  A total of 3 medically necessary complex treatment devices will be fabricated and supervised by me: 2 fields with MLCs for custom blocks to protect heart, and lungs;  and, a Vac-lok. MORE COMPLEX DEVICES MAY BE MADE IN DOSIMETRY FOR FIELD IN FIELD BEAMS FOR DOSE HOMOGENEITY.  I have requested : 3D Simulation which is medically necessary to give adequate dose to at risk tissues while sparing lungs and heart.  I have requested a DVH of the following structures: lungs,  heart, right lumpectomy cavity.    The patient will receive 40.05 Gy in 15 fractions to the right breast with 2 fields.  This will be followed by a boost.  I spent 20 minutes face to face with the patient and more than 50% of that time was spent in counseling and/or coordination of care. _____________________________________   Eppie Gibson, MD  This document serves as a record of services personally performed by Eppie Gibson, MD. It was created on her behalf by Rae Lips, a trained medical scribe. The creation of this record is based on the scribe's personal observations and the provider's statements to them. This document has been checked and approved by the attending provider.

## 2017-04-25 DIAGNOSIS — C50511 Malignant neoplasm of lower-outer quadrant of right female breast: Secondary | ICD-10-CM | POA: Diagnosis not present

## 2017-05-09 ENCOUNTER — Ambulatory Visit
Admission: RE | Admit: 2017-05-09 | Discharge: 2017-05-09 | Disposition: A | Discharge status: Home / Self Care | Payer: 59 | Source: Ambulatory Visit | Attending: Radiation Oncology | Admitting: Radiation Oncology

## 2017-05-09 DIAGNOSIS — C50511 Malignant neoplasm of lower-outer quadrant of right female breast: Secondary | ICD-10-CM | POA: Diagnosis not present

## 2017-05-09 DIAGNOSIS — Z510 Encounter for antineoplastic radiation therapy: Secondary | ICD-10-CM | POA: Diagnosis not present

## 2017-05-09 DIAGNOSIS — Z170 Estrogen receptor positive status [ER+]: Secondary | ICD-10-CM | POA: Diagnosis not present

## 2017-05-10 ENCOUNTER — Ambulatory Visit
Admission: RE | Admit: 2017-05-10 | Discharge: 2017-05-10 | Disposition: A | Discharge status: Home / Self Care | Payer: 59 | Source: Ambulatory Visit | Attending: Radiation Oncology | Admitting: Radiation Oncology

## 2017-05-10 DIAGNOSIS — C50511 Malignant neoplasm of lower-outer quadrant of right female breast: Secondary | ICD-10-CM | POA: Diagnosis not present

## 2017-05-10 DIAGNOSIS — Z5112 Encounter for antineoplastic immunotherapy: Secondary | ICD-10-CM | POA: Diagnosis not present

## 2017-05-10 DIAGNOSIS — Z170 Estrogen receptor positive status [ER+]: Secondary | ICD-10-CM | POA: Diagnosis not present

## 2017-05-11 ENCOUNTER — Ambulatory Visit
Admission: RE | Admit: 2017-05-11 | Discharge: 2017-05-11 | Disposition: A | Discharge status: Home / Self Care | Payer: 59 | Source: Ambulatory Visit | Attending: Radiation Oncology | Admitting: Radiation Oncology

## 2017-05-11 DIAGNOSIS — C50511 Malignant neoplasm of lower-outer quadrant of right female breast: Secondary | ICD-10-CM | POA: Diagnosis not present

## 2017-05-14 ENCOUNTER — Ambulatory Visit
Admission: RE | Admit: 2017-05-14 | Discharge: 2017-05-14 | Disposition: A | Discharge status: Home / Self Care | Payer: 59 | Source: Ambulatory Visit | Attending: Radiation Oncology | Admitting: Radiation Oncology

## 2017-05-14 DIAGNOSIS — Z170 Estrogen receptor positive status [ER+]: Principal | ICD-10-CM

## 2017-05-14 DIAGNOSIS — C50511 Malignant neoplasm of lower-outer quadrant of right female breast: Secondary | ICD-10-CM | POA: Diagnosis not present

## 2017-05-14 MED ORDER — RADIAPLEXRX EX GEL
Freq: Once | CUTANEOUS | Status: AC
  Administered 2017-05-14: 11:00:00 via TOPICAL

## 2017-05-14 MED ORDER — ALRA NON-METALLIC DEODORANT (RAD-ONC)
1.0000 "application " | Freq: Once | TOPICAL | Status: AC
  Administered 2017-05-14: 1 via TOPICAL

## 2017-05-14 NOTE — Progress Notes (Signed)

## 2017-05-15 ENCOUNTER — Inpatient Hospital Stay: Payer: 59 | Attending: Adult Health

## 2017-05-15 ENCOUNTER — Ambulatory Visit
Admission: RE | Admit: 2017-05-15 | Discharge: 2017-05-15 | Disposition: A | Discharge status: Home / Self Care | Payer: 59 | Source: Ambulatory Visit | Attending: Radiation Oncology | Admitting: Radiation Oncology

## 2017-05-15 ENCOUNTER — Inpatient Hospital Stay: Payer: 59

## 2017-05-15 VITALS — BP 154/91 | HR 73 | Temp 98.3°F | Resp 17

## 2017-05-15 DIAGNOSIS — Z170 Estrogen receptor positive status [ER+]: Principal | ICD-10-CM

## 2017-05-15 DIAGNOSIS — Z5112 Encounter for antineoplastic immunotherapy: Secondary | ICD-10-CM | POA: Insufficient documentation

## 2017-05-15 DIAGNOSIS — C50511 Malignant neoplasm of lower-outer quadrant of right female breast: Secondary | ICD-10-CM | POA: Diagnosis present

## 2017-05-15 LAB — CMP (CANCER CENTER ONLY)
ALBUMIN: 4 g/dL (ref 3.5–5.0)
ALK PHOS: 70 U/L (ref 40–150)
ALT: 26 U/L (ref 0–55)
AST: 22 U/L (ref 5–34)
Anion gap: 7 (ref 3–11)
BILIRUBIN TOTAL: 0.5 mg/dL (ref 0.2–1.2)
BUN: 18 mg/dL (ref 7–26)
CALCIUM: 10.2 mg/dL (ref 8.4–10.4)
CO2: 28 mmol/L (ref 22–29)
Chloride: 104 mmol/L (ref 98–109)
Creatinine: 0.81 mg/dL (ref 0.60–1.10)
GFR, Est AFR Am: >60 mL/min (ref >60)
GFR, Estimated: >60 mL/min (ref >60)
GLUCOSE: 86 mg/dL (ref 70–140)
Potassium: 4.3 mmol/L (ref 3.5–5.1)
SODIUM: 139 mmol/L (ref 136–145)
TOTAL PROTEIN: 7.1 g/dL (ref 6.4–8.3)

## 2017-05-15 LAB — CBC WITH DIFFERENTIAL (CANCER CENTER ONLY)
BASOS ABS: 0 10*3/uL (ref 0.0–0.1)
BASOS PCT: 0 %
EOS ABS: 0.3 10*3/uL (ref 0.0–0.5)
Eosinophils Relative: 4 %
HCT: 42.3 % (ref 34.8–46.6)
HEMOGLOBIN: 14 g/dL (ref 11.6–15.9)
Lymphocytes Relative: 18 %
Lymphs Abs: 1.3 10*3/uL (ref 0.9–3.3)
MCH: 30.1 pg (ref 25.1–34.0)
MCHC: 33.1 g/dL (ref 31.5–36.0)
MCV: 91 fL (ref 79.5–101.0)
MONOS PCT: 10 %
Monocytes Absolute: 0.7 10*3/uL (ref 0.1–0.9)
NEUTROS PCT: 68 %
Neutro Abs: 5.1 10*3/uL (ref 1.5–6.5)
Platelet Count: 247 10*3/uL (ref 145–400)
RBC: 4.65 MIL/uL (ref 3.70–5.45)
RDW: 14.1 % (ref 11.2–14.5)
WBC: 7.4 10*3/uL (ref 3.9–10.3)

## 2017-05-15 MED ORDER — ACETAMINOPHEN 325 MG PO TABS
650.0000 mg | ORAL_TABLET | Freq: Once | ORAL | Status: AC
  Administered 2017-05-15: 650 mg via ORAL

## 2017-05-15 MED ORDER — SODIUM CHLORIDE 0.9 % IV SOLN
Freq: Once | INTRAVENOUS | Status: AC
  Administered 2017-05-15: 12:00:00 via INTRAVENOUS

## 2017-05-15 MED ORDER — SODIUM CHLORIDE 0.9% FLUSH
10.0000 mL | INTRAVENOUS | Status: DC | PRN
  Administered 2017-05-15: 10 mL
  Filled 2017-05-15: qty 10

## 2017-05-15 MED ORDER — TRASTUZUMAB CHEMO 150 MG IV SOLR
300.0000 mg | Freq: Once | INTRAVENOUS | Status: AC
  Administered 2017-05-15: 300 mg via INTRAVENOUS
  Filled 2017-05-15: qty 14.29

## 2017-05-15 MED ORDER — ACETAMINOPHEN 325 MG PO TABS
ORAL_TABLET | ORAL | Status: AC
  Filled 2017-05-15: qty 2

## 2017-05-15 MED ORDER — HEPARIN SOD (PORK) LOCK FLUSH 100 UNIT/ML IV SOLN
500.0000 [IU] | Freq: Once | INTRAVENOUS | Status: AC | PRN
  Administered 2017-05-15: 500 [IU]
  Filled 2017-05-15: qty 5

## 2017-05-15 NOTE — Patient Instructions (Signed)
[facility] Discharge Instructions for Patients Receiving Chemotherapy  Today you received the following chemotherapy agents Herceptin  To help prevent nausea and vomiting after your treatment, we encourage you to take your nausea medication as directed   If you develop nausea and vomiting that is not controlled by your nausea medication, call the clinic.   BELOW ARE SYMPTOMS THAT SHOULD BE REPORTED IMMEDIATELY:  *FEVER GREATER THAN 100.5 F  *CHILLS WITH OR WITHOUT FEVER  NAUSEA AND VOMITING THAT IS NOT CONTROLLED WITH YOUR NAUSEA MEDICATION  *UNUSUAL SHORTNESS OF BREATH  *UNUSUAL BRUISING OR BLEEDING  TENDERNESS IN MOUTH AND THROAT WITH OR WITHOUT PRESENCE OF ULCERS  *URINARY PROBLEMS  *BOWEL PROBLEMS  UNUSUAL RASH Items with * indicate a potential emergency and should be followed up as soon as possible.  Feel free to call the clinic should you have any questions or concerns. The clinic phone number is [phone].  Please show the CHEMO ALERT CARD at check-in to the Emergency Department and triage nurse.   

## 2017-05-16 ENCOUNTER — Encounter: Payer: Self-pay | Admitting: Radiation Oncology

## 2017-05-16 ENCOUNTER — Ambulatory Visit
Admission: RE | Admit: 2017-05-16 | Discharge: 2017-05-16 | Disposition: A | Discharge status: Home / Self Care | Payer: 59 | Source: Ambulatory Visit | Attending: Radiation Oncology | Admitting: Radiation Oncology

## 2017-05-16 DIAGNOSIS — C50511 Malignant neoplasm of lower-outer quadrant of right female breast: Secondary | ICD-10-CM | POA: Diagnosis not present

## 2017-05-16 NOTE — Progress Notes (Signed)
I met with the patient today during her undertreatment assessment with Dr. Sondra Come.  She is a patient of Dr. Isidore Moos, and just began radiotherapy, she is completed 5 fractions thus far.  On examination, the skin in the right breast appears to be doing very well without evidence of any desquamation, or hyperpigmentation.  No evidence of edema of the chest wall or right upper extremity is identified.  She will continue radioplex and follow-up next week for her undertreatment assessment with Dr. Isidore Moos, or contact us sooner if she has questions or concerns.    Carola Rhine, PAC

## 2017-05-17 ENCOUNTER — Ambulatory Visit
Admission: RE | Admit: 2017-05-17 | Discharge: 2017-05-17 | Disposition: A | Discharge status: Home / Self Care | Payer: 59 | Source: Ambulatory Visit | Attending: Radiation Oncology | Admitting: Radiation Oncology

## 2017-05-17 DIAGNOSIS — C50511 Malignant neoplasm of lower-outer quadrant of right female breast: Secondary | ICD-10-CM | POA: Diagnosis not present

## 2017-05-18 ENCOUNTER — Ambulatory Visit: Payer: 59

## 2017-05-21 ENCOUNTER — Ambulatory Visit
Admission: RE | Admit: 2017-05-21 | Discharge: 2017-05-21 | Disposition: A | Discharge status: Home / Self Care | Payer: 59 | Source: Ambulatory Visit | Attending: Radiation Oncology | Admitting: Radiation Oncology

## 2017-05-21 DIAGNOSIS — C50511 Malignant neoplasm of lower-outer quadrant of right female breast: Secondary | ICD-10-CM | POA: Diagnosis not present

## 2017-05-22 ENCOUNTER — Ambulatory Visit
Admission: RE | Admit: 2017-05-22 | Discharge: 2017-05-22 | Disposition: A | Discharge status: Home / Self Care | Payer: 59 | Source: Ambulatory Visit | Attending: Radiation Oncology | Admitting: Radiation Oncology

## 2017-05-22 DIAGNOSIS — C50511 Malignant neoplasm of lower-outer quadrant of right female breast: Secondary | ICD-10-CM | POA: Diagnosis not present

## 2017-05-23 ENCOUNTER — Ambulatory Visit
Admission: RE | Admit: 2017-05-23 | Discharge: 2017-05-23 | Disposition: A | Discharge status: Home / Self Care | Payer: 59 | Source: Ambulatory Visit | Attending: Radiation Oncology | Admitting: Radiation Oncology

## 2017-05-23 DIAGNOSIS — C50511 Malignant neoplasm of lower-outer quadrant of right female breast: Secondary | ICD-10-CM | POA: Diagnosis not present

## 2017-05-24 ENCOUNTER — Ambulatory Visit
Admission: RE | Admit: 2017-05-24 | Discharge: 2017-05-24 | Disposition: A | Discharge status: Home / Self Care | Payer: 59 | Source: Ambulatory Visit | Attending: Radiation Oncology | Admitting: Radiation Oncology

## 2017-05-24 DIAGNOSIS — C50511 Malignant neoplasm of lower-outer quadrant of right female breast: Secondary | ICD-10-CM | POA: Diagnosis not present

## 2017-05-25 ENCOUNTER — Ambulatory Visit
Admission: RE | Admit: 2017-05-25 | Discharge: 2017-05-25 | Disposition: A | Discharge status: Home / Self Care | Payer: 59 | Source: Ambulatory Visit | Attending: Radiation Oncology | Admitting: Radiation Oncology

## 2017-05-25 DIAGNOSIS — C50511 Malignant neoplasm of lower-outer quadrant of right female breast: Secondary | ICD-10-CM | POA: Diagnosis not present

## 2017-05-28 ENCOUNTER — Ambulatory Visit
Admission: RE | Admit: 2017-05-28 | Discharge: 2017-05-28 | Disposition: A | Discharge status: Home / Self Care | Payer: 59 | Source: Ambulatory Visit | Attending: Radiation Oncology | Admitting: Radiation Oncology

## 2017-05-28 ENCOUNTER — Ambulatory Visit: Payer: 59

## 2017-05-28 DIAGNOSIS — C50511 Malignant neoplasm of lower-outer quadrant of right female breast: Secondary | ICD-10-CM | POA: Diagnosis not present

## 2017-05-29 ENCOUNTER — Ambulatory Visit
Admission: RE | Admit: 2017-05-29 | Discharge: 2017-05-29 | Disposition: A | Discharge status: Home / Self Care | Payer: 59 | Source: Ambulatory Visit | Attending: Radiation Oncology | Admitting: Radiation Oncology

## 2017-05-29 DIAGNOSIS — C50511 Malignant neoplasm of lower-outer quadrant of right female breast: Secondary | ICD-10-CM | POA: Diagnosis not present

## 2017-05-30 ENCOUNTER — Ambulatory Visit: Payer: 59

## 2017-05-30 DIAGNOSIS — Z170 Estrogen receptor positive status [ER+]: Secondary | ICD-10-CM | POA: Diagnosis not present

## 2017-05-30 DIAGNOSIS — Z510 Encounter for antineoplastic radiation therapy: Secondary | ICD-10-CM | POA: Insufficient documentation

## 2017-05-30 DIAGNOSIS — C50511 Malignant neoplasm of lower-outer quadrant of right female breast: Secondary | ICD-10-CM | POA: Insufficient documentation

## 2017-05-31 ENCOUNTER — Ambulatory Visit: Payer: 59

## 2017-05-31 DIAGNOSIS — C50511 Malignant neoplasm of lower-outer quadrant of right female breast: Secondary | ICD-10-CM | POA: Diagnosis not present

## 2017-05-31 DIAGNOSIS — Z170 Estrogen receptor positive status [ER+]: Secondary | ICD-10-CM | POA: Diagnosis not present

## 2017-05-31 DIAGNOSIS — Z5112 Encounter for antineoplastic immunotherapy: Secondary | ICD-10-CM | POA: Diagnosis not present

## 2017-06-01 ENCOUNTER — Ambulatory Visit
Admission: RE | Admit: 2017-06-01 | Discharge: 2017-06-01 | Disposition: A | Discharge status: Home / Self Care | Payer: 59 | Source: Ambulatory Visit | Attending: Radiation Oncology | Admitting: Radiation Oncology

## 2017-06-01 DIAGNOSIS — C50511 Malignant neoplasm of lower-outer quadrant of right female breast: Secondary | ICD-10-CM | POA: Diagnosis not present

## 2017-06-04 ENCOUNTER — Ambulatory Visit
Admission: RE | Admit: 2017-06-04 | Discharge: 2017-06-04 | Disposition: A | Discharge status: Home / Self Care | Payer: 59 | Source: Ambulatory Visit | Attending: Radiation Oncology | Admitting: Radiation Oncology

## 2017-06-04 DIAGNOSIS — C50511 Malignant neoplasm of lower-outer quadrant of right female breast: Secondary | ICD-10-CM

## 2017-06-04 DIAGNOSIS — Z170 Estrogen receptor positive status [ER+]: Principal | ICD-10-CM

## 2017-06-04 MED ORDER — RADIAPLEXRX EX GEL
Freq: Once | CUTANEOUS | Status: AC
  Administered 2017-06-04: 11:00:00 via TOPICAL

## 2017-06-04 NOTE — Progress Notes (Signed)
[place]  [phone] [phone]     ID: Katie Marquez DOB: [birth year]  MR#: [id number]  UTM#:[id number]  Patient Care Team: Jonathon Jordan, MD as PCP - General (Family Medicine) Fanny Skates, MD as Consulting Physician (General Surgery) Querida Beretta, Virgie Dad, MD as Consulting Physician (Oncology) Richmond Campbell, MD as Consulting Physician (Gastroenterology) Eppie Gibson, MD as Attending Physician (Radiation Oncology) Lovey Newcomer, MD as Attending Physician (Radiology) Bensimhon, Shaune Pascal, MD as Consulting Physician (Cardiology) Molli Posey, MD as Consulting Physician (Obstetrics and Gynecology) OTHER MD:  CHIEF COMPLAINT: Triple positive breast cancer  CURRENT TREATMENT: continuing trastuzumab; completing adjuvant radiation    HISTORY OF CURRENT ILLNESS: From the original intake note:  Katie Marquez had bilateral screening mammography with tomography at the breast center 12/12/2016, showing a possible mass in the right breast.  Right breast diagnostic mammography with tomography and right breast ultrasonography 12/15/2016 found the breast density to be category D.  In the lower right breast there was a small lobulated mass which was not palpable.  Ultrasound showed a 0.8 cm mass in the right breast 7:00 radiant 2 cm from the nipple.  The right axilla was benign.  Biopsy of the right breast mass in question 12/15/2016 showed ([id number]) and invasive ductal carcinoma, grade 3, estrogen receptor at 95% positive, progesterone receptor 50% positive, both with strong staining intensity, with an MIB-1 of 15%, and HER-2 amplification, the signals ratio being 1.83 but the number per cell 10.23.  The patient's subsequent history is as detailed below.  INTERVAL HISTORY: Katie Marquez returns today for follow-up and treatment of her triple positive breast cancer accompanied by her husband. She receives trastuzumab every 21 days, with a dose due today. She  tolerates this well. She denies issues with nausea or diarrhea.   Her most recent echocardiogram on 04/19/2017 showed an ejection fraction in the 60-65% range.  She is already scheduled for her next echo next month.  She completes radiation treatment on 06/06/2017. She has tolerated this well. Her skin is tender, red, and minimally sore. She denies having fatigue.   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 She denies unusual headaches, visual changes, nausea, vomiting, or dizziness. There has been no unusual cough, phlegm production, or pleurisy. This been no change in bowel or bladder habits. She denies unexplained fatigue or unexplained weight loss, bleeding, rash, or fever. A detailed review of systems was otherwise stable.     PAST MEDICAL HISTORY: Past Medical History:  Diagnosis Date  . Anxiety   . Cancer [facility])    breast cancer  . Diverticulosis   . Family history of kidney cancer   . Family history of thyroid cancer   . Heart murmur   . Hypertension   . IBS (irritable bowel syndrome)   . Iron deficiency   . Osteoarthritis   . PONV (postoperative nausea and vomiting)   . Tremors of nervous system    essential tremors    PAST SURGICAL HISTORY: Past Surgical History:  Procedure Laterality Date  . BREAST LUMPECTOMY WITH RADIOACTIVE SEED AND SENTINEL LYMPH NODE BIOPSY Right 01/08/2017   Procedure: RIGHT BREAST LUMPECTOMY WITH RADIOACTIVE SEED AND RIGHT SENTINEL LYMPH NODE BIOPSY ERAS PATHWAY;  Surgeon: Fanny Skates, MD;  Location: [facility];  Service: General;  Laterality: Right;  PEC BLOCK  . LAPAROSCOPIC SIGMOID COLECTOMY    . LAPAROSCOPY    . PORTACATH PLACEMENT Left 01/08/2017  Procedure: INSERTION PORT-A-CATH WITH ULTRA SOUND;  Surgeon: Fanny Skates, MD;  Location: [facility] OR;  Service: General;  Laterality: Left;    FAMILY HISTORY Family History  Problem Relation Age of Onset  . Rheum  arthritis Mother   . Hyperlipidemia Mother   . Hypertension Mother   . Thyroid cancer Mother 48  . Diverticulitis Father   . Parkinson's disease Father   . Kidney cancer Father 40  . Multiple sclerosis Maternal Aunt   . Breast cancer Neg Hx   Katie Marquez comes from an [place] background.  Her father died at 48 with Parkinson's disease and complications from tobacco abuse including a history of renal cancer; the patient's mother died at age 79, with a history of rheumatoid arthritis and thyroid cancer.  The patient had no brothers, 1 sister, in good health.  There is no history of breast or ovarian cancer in the family  GYNECOLOGIC HISTORY:  No LMP recorded. Patient is postmenopausal. Menarche age 33, first live birth age 71.  The patient is GX P1.  She had infertility treatments after her first live birth but they were not successful.  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  Their daughter [name] lives in [place].  She attended [employer] and then [employer] in business and psychology.  She is planning to get married in September 2019.  Her fiance is in finance.     ADVANCED DIRECTIVES:    HEALTH MAINTENANCE: Social History   Tobacco Use  . Smoking status: Never Smoker  . Smokeless tobacco: Never Used  Substance Use Topics  . Alcohol use: Yes    Frequency: Never    Comment: occas  . Drug use: No     Colonoscopy: [facility]  PAP: [place]  Bone density: Osteopenia/ [name] around 2017   No Known Allergies  Current Outpatient Medications  Medication Sig Dispense Refill  . CALCIUM PO Take by mouth daily.    . cholecalciferol (VITAMIN D) 1000 units tablet Take 2,000 Units by mouth daily.    Marland Kitchen FLUoxetine (PROZAC) 10 MG capsule Take 20 mg by mouth daily.     Marland Kitchen gabapentin  (NEURONTIN) 300 MG capsule Take 1 capsule (300 mg total) by mouth at bedtime. (Patient not taking: Reported on 04/24/2017) 90 capsule 4  . lisinopril (PRINIVIL,ZESTRIL) 10 MG tablet Take 1 tablet (10 mg total) by mouth daily. 30 tablet 3  . Probiotic Product (PROBIOTIC-10 PO) Take by mouth.    . Red Yeast Rice 600 MG CAPS Take by mouth.    . valACYclovir (VALTREX) 1000 MG tablet Take 1 tablet (1,000 mg total) by mouth daily. (Patient not taking: Reported on 04/24/2017) 30 tablet 0   No current facility-administered medications for this visit.     OBJECTIVE: Middle-aged white woman who appears well Vitals:   06/05/17 0932  BP: 130/89  Pulse: 68  Resp: 18  Temp: 98.6 F (37 C)  SpO2: 99%     Body mass index is 21.45 kg/m.   Wt Readings from Last 3 Encounters:  06/05/17 117 lb 4.8 oz (53.2 kg)  04/24/17 118 lb 3.2 oz (53.6 kg)  04/19/17 119 lb (54 kg)      ECOG FS:1 - Symptomatic but completely ambulatory   Sclerae unicteric, pupils round and equal Oropharynx clear and moist No  cervical or supraclavicular adenopathy Lungs no rales or rhonchi Heart regular rate and rhythm Abd soft, nontender, positive bowel sounds MSK no focal spinal tenderness, no upper extremity lymphedema Neuro: nonfocal, well oriented, appropriate affect Breasts: The right breast is status post lumpectomy and is currently receiving radiation.  There is mild erythema.  The cosmetic result is excellent.  There is no evidence of disease recurrence or residual disease.  The left breast is benign.  Both axillae are benign.  LAB RESULTS:  CMP     Component Value Date/Time   NA 138 06/05/2017 0919   NA 141 01/31/2017 1225   K 4.2 06/05/2017 0919   K 4.1 01/31/2017 1225   CL 103 06/05/2017 0919   CO2 29 06/05/2017 0919   CO2 29 01/31/2017 1225   GLUCOSE 89 06/05/2017 0919   GLUCOSE 86 01/31/2017 1225   BUN 15 06/05/2017 0919   BUN 17.4 01/31/2017 1225   CREATININE 0.88 06/05/2017 0919   CREATININE 0.8  01/31/2017 1225   CALCIUM 10.1 06/05/2017 0919   CALCIUM 10.0 01/31/2017 1225   PROT 7.0 06/05/2017 0919   PROT 7.2 01/31/2017 1225   ALBUMIN 4.1 06/05/2017 0919   ALBUMIN 4.0 01/31/2017 1225   AST 21 06/05/2017 0919   AST 18 01/31/2017 1225   ALT 20 06/05/2017 0919   ALT 29 01/31/2017 1225   ALKPHOS 65 06/05/2017 0919   ALKPHOS 66 01/31/2017 1225   BILITOT 0.5 06/05/2017 0919   BILITOT 0.57 01/31/2017 1225   GFRNONAA >60 06/05/2017 0919   GFRAA >60 06/05/2017 0919    No results found for: TOTALPROTELP, ALBUMINELP, A1GS, A2GS, BETS, BETA2SER, GAMS, MSPIKE, SPEI  No results found for: Nils Pyle, [facility]  Lab Results  Component Value Date   WBC 7.4 05/15/2017   NEUTROABS 5.1 05/15/2017   HGB 14.0 05/15/2017   HCT 42.3 05/15/2017   MCV 91.0 05/15/2017   PLT 247 05/15/2017      Chemistry      Component Value Date/Time   NA 138 06/05/2017 0919   NA 141 01/31/2017 1225   K 4.2 06/05/2017 0919   K 4.1 01/31/2017 1225   CL 103 06/05/2017 0919   CO2 29 06/05/2017 0919   CO2 29 01/31/2017 1225   BUN 15 06/05/2017 0919   BUN 17.4 01/31/2017 1225   CREATININE 0.88 06/05/2017 0919   CREATININE 0.8 01/31/2017 1225      Component Value Date/Time   CALCIUM 10.1 06/05/2017 0919   CALCIUM 10.0 01/31/2017 1225   ALKPHOS 65 06/05/2017 0919   ALKPHOS 66 01/31/2017 1225   AST 21 06/05/2017 0919   AST 18 01/31/2017 1225   ALT 20 06/05/2017 0919   ALT 29 01/31/2017 1225   BILITOT 0.5 06/05/2017 0919   BILITOT 0.57 01/31/2017 1225       No results found for: LABCA2  No components found for: INOMVE720  No results for input(s): INR in the last 168 hours.  No results found for: LABCA2  No results found for: NOB096  No results found for: GEZ662  No results found for: HUT654  No results found for: CA2729  No components found for: HGQUANT  No results found for: CEA1 / No results found for: CEA1   No results found for: AFPTUMOR  No results found  for: CHROMOGRNA  No results found for: PSA1  Appointment on 06/05/2017  Component Date Value Ref Range Status  . Sodium 06/05/2017 138  136 - 145 mmol/L Final  . Potassium 06/05/2017 4.2  3.5 - 5.1 mmol/L Final  . Chloride 06/05/2017 103  98 - 109 mmol/L Final  . CO2 06/05/2017 29  22 - 29 mmol/L Final  . Glucose, Bld 06/05/2017 89  70 - 140 mg/dL Final  . BUN 06/05/2017 15  7 - 26 mg/dL Final  . Creatinine 06/05/2017 0.88  0.60 - 1.10 mg/dL Final  . Calcium 06/05/2017 10.1  8.4 - 10.4 mg/dL Final  . Total Protein 06/05/2017 7.0  6.4 - 8.3 g/dL Final  . Albumin 06/05/2017 4.1  3.5 - 5.0 g/dL Final  . AST 06/05/2017 21  5 - 34 U/L Final  . ALT 06/05/2017 20  0 - 55 U/L Final  . Alkaline Phosphatase 06/05/2017 65  40 - 150 U/L Final  . Total Bilirubin 06/05/2017 0.5  0.2 - 1.2 mg/dL Final  . GFR, Est Non Af Am 06/05/2017 >60  >60 mL/min Final  . GFR, Est AFR Am 06/05/2017 >60  >60 mL/min Final   Comment: (NOTE) The eGFR has been calculated using the CKD EPI equation. This calculation has not been validated in all clinical situations. eGFR's persistently <60 mL/min signify possible Chronic Kidney Disease.   Georgiann Hahn gap 06/05/2017 6  3 - 11 Final   Performed at [facility], [place] [address], [place], [place] [address]    (this displays the last labs from the last 3 days)  No results found for: TOTALPROTELP, ALBUMINELP, A1GS, A2GS, BETS, BETA2SER, GAMS, MSPIKE, SPEI (this displays SPEP labs)  No results found for: KPAFRELGTCHN, LAMBDASER, KAPLAMBRATIO (kappa/lambda light chains)  No results found for: HGBA, HGBA2QUANT, HGBFQUANT, HGBSQUAN (Hemoglobinopathy evaluation)   No results found for: LDH  No results found for: IRON, TIBC, IRONPCTSAT (Iron and TIBC)  No results found for: FERRITIN  Urinalysis No results found for: COLORURINE, APPEARANCEUR, LABSPEC, PHURINE, GLUCOSEU, HGBUR, BILIRUBINUR, KETONESUR, PROTEINUR, UROBILINOGEN, NITRITE,  LEUKOCYTESUR   STUDIES: She had a bone density at Dr. Mare Loan "about 2 years ago" which she says showed mild osteopenia.  ELIGIBLE FOR AVAILABLE RESEARCH PROTOCOL: no  ASSESSMENT: 62 y.o. [place] woman status post right breast lower outer quadrant biopsy 12/15/2016 for a clinical T1b N0, stage IA invasive ductal carcinoma, grade 3, triple positive, with an MIB-1 of 15%  (1) status post right lumpectomy 01/08/2017 for a  pT1c pN0, stage IA invasive ductal carcinoma, grade 2, with negative margins.    (2) adjuvant chemotherapy consisting of paclitaxel weekly x12, together with trastuzumab every 21 days stared 01/31/2017  (3) trastuzumab to be continued to total 1 year (through December 2019)  (a) echocardiogram 01/17/2017 demonstrates a LVEF of 65-70%  (b) echocardiogram 04/19/2017 showed an ejection fraction in the 60-65% range  (4) adjuvant radiation to be completed 06/06/2017  (5) to start tamoxifen 06/30/2017  (6) genetics testing completed on 02/20/2017 offered through [facility] showed: no deleterious mutations. The following genes were evaluated for sequence changes and exonic deletions/duplications: APC, ATM, AXIN2, BAP1, BARD1, BMPR1A, BRCA1, BRCA2, BRIP1, BUB1B, CDC73, CDH1, CDK4, CDKN1C, CDKN2A (p14ARF), CDKN2A (p16INK4a), CEP57, CHEK2, CTNNA1, DICER1, DIS3L2, EPCAM*, FH, FLCN, GPC3, GREM1*, KIT, MEN1, MET, MLH1, MSH2, MSH3, MSH6, MUTYH, NBN, NF1, PALB2, PDGFRA, PMS2, POLD1, POLE, PTEN, RAD50, RAD51C, RAD51D, SDHB,SDHC, SDHD, SMAD4, SMARCA4, SMARCB1, STK11, TP53, TSC1, TSC2, VHL, WT1. The following genes were evaluated for sequence changes only: HOXB13*, MITF*, NTHL1*, SDHA. Results are negative unless otherwise indicated  PLAN: Katie Marquez is continuing on trastuzumab every 3 weeks with no side effects.  She is already scheduled for her next echocardiogram.  She did very well with her chemotherapy and specifically there was no peripheral neuropathy and her hair is growing back slowly  but over the entire scalp.  Hopefully she will have hair eyebrows and eyelashes by the time of the September wedding  She completes radiation tomorrow.  She has tolerated that remarkably well.  She is now ready to start anti-estrogens. We discussed the difference between tamoxifen and anastrozole in detail. She understands that anastrozole and the aromatase inhibitors in general work by blocking estrogen production. Accordingly vaginal dryness, decrease in bone density, and of course hot flashes can result. The aromatase inhibitors can also negatively affect the cholesterol profile, although that is a minor effect. One out of 5 women on aromatase inhibitors we will feel "old and achy". This arthralgia/myalgia syndrome, which resembles fibromyalgia clinically, does resolve with stopping the medications. Accordingly this is not a reason to not try an aromatase inhibitor but it is a frequent reason to stop it (in other words 20% of women will not be able to tolerate these medications).  Tamoxifen on the other hand does not block estrogen production. It does not "take away a woman's estrogen". It blocks the estrogen receptor in breast cells. Like anastrozole, it can also cause hot flashes. As opposed to anastrozole, tamoxifen has many estrogen-like effects. It is technically an estrogen receptor modulator. This means that in some tissues tamoxifen works like estrogen-- for example it helps strengthen the bones. It tends to improve the cholesterol profile. It can cause thickening of the endometrial lining, and even endometrial polyps or rarely cancer of the uterus.(The risk of uterine cancer due to tamoxifen is one additional cancer per thousand women year). It can cause vaginal wetness or stickiness. It can cause blood clots through this estrogen-like effect--the risk of blood clots with tamoxifen is exactly the same as with birth control pills or hormone replacement.  Neither of these agents causes mood changes  or weight gain, despite the popular belief that they can have these side effects. We have data from studies comparing either of these drugs with placebo, and in those cases the control group had the same amount of weight gain and depression as the group that took the drug.  All this information was given to her in writing.  After all this discussion she is clear that she would like to give tamoxifen a try.  Her starting date is 06/30/2017 to give her a little time to recover from her radiation.  She is having some visual changes but I suggest that she not get new glasses until at least September or October because things may revert to baseline.  I have encouraged her to continue to be as normal as possible, with no restrictions in any activities.  She will see me again in October.  She knows to call for any issues that may develop before then.     Reyah Streeter, Virgie Dad, MD  06/05/17 10:03 AM Medical Oncology and Hematology [facility] [address] [place], [place] [address] Tel. [phone]    Fax. [phone]  This document serves as a record of services personally performed by Lurline Del, MD. It was created on his behalf by Sheron Nightingale, a trained medical scribe. The creation of this record is based on the scribe's personal observations and the provider's statements to them.   I have reviewed the above documentation for accuracy and completeness, and I agree with the above.

## 2017-06-05 ENCOUNTER — Inpatient Hospital Stay: Payer: 59

## 2017-06-05 ENCOUNTER — Inpatient Hospital Stay: Payer: 59 | Attending: Adult Health

## 2017-06-05 ENCOUNTER — Ambulatory Visit
Admission: RE | Admit: 2017-06-05 | Discharge: 2017-06-05 | Disposition: A | Discharge status: Home / Self Care | Payer: 59 | Source: Ambulatory Visit | Attending: Radiation Oncology | Admitting: Radiation Oncology

## 2017-06-05 ENCOUNTER — Inpatient Hospital Stay (HOSPITAL_BASED_OUTPATIENT_CLINIC_OR_DEPARTMENT_OTHER): Payer: 59 | Admitting: Oncology

## 2017-06-05 ENCOUNTER — Ambulatory Visit: Payer: 59

## 2017-06-05 VITALS — BP 130/89 | HR 68 | Temp 98.6°F | Resp 18 | Ht 62.0 in | Wt 117.3 lb

## 2017-06-05 DIAGNOSIS — R011 Cardiac murmur, unspecified: Secondary | ICD-10-CM | POA: Insufficient documentation

## 2017-06-05 DIAGNOSIS — K589 Irritable bowel syndrome without diarrhea: Secondary | ICD-10-CM | POA: Diagnosis not present

## 2017-06-05 DIAGNOSIS — Z8051 Family history of malignant neoplasm of kidney: Secondary | ICD-10-CM | POA: Insufficient documentation

## 2017-06-05 DIAGNOSIS — C50511 Malignant neoplasm of lower-outer quadrant of right female breast: Secondary | ICD-10-CM | POA: Diagnosis not present

## 2017-06-05 DIAGNOSIS — Z170 Estrogen receptor positive status [ER+]: Secondary | ICD-10-CM | POA: Diagnosis not present

## 2017-06-05 DIAGNOSIS — Z5112 Encounter for antineoplastic immunotherapy: Secondary | ICD-10-CM | POA: Diagnosis not present

## 2017-06-05 DIAGNOSIS — M1990 Unspecified osteoarthritis, unspecified site: Secondary | ICD-10-CM | POA: Diagnosis not present

## 2017-06-05 DIAGNOSIS — F419 Anxiety disorder, unspecified: Secondary | ICD-10-CM | POA: Diagnosis not present

## 2017-06-05 DIAGNOSIS — Z79899 Other long term (current) drug therapy: Secondary | ICD-10-CM | POA: Diagnosis not present

## 2017-06-05 DIAGNOSIS — Z808 Family history of malignant neoplasm of other organs or systems: Secondary | ICD-10-CM | POA: Diagnosis not present

## 2017-06-05 DIAGNOSIS — I10 Essential (primary) hypertension: Secondary | ICD-10-CM | POA: Diagnosis not present

## 2017-06-05 LAB — CMP (CANCER CENTER ONLY)
ALBUMIN: 4.1 g/dL (ref 3.5–5.0)
ALT: 20 U/L (ref 0–55)
ANION GAP: 6 (ref 3–11)
AST: 21 U/L (ref 5–34)
Alkaline Phosphatase: 65 U/L (ref 40–150)
BUN: 15 mg/dL (ref 7–26)
CO2: 29 mmol/L (ref 22–29)
Calcium: 10.1 mg/dL (ref 8.4–10.4)
Chloride: 103 mmol/L (ref 98–109)
Creatinine: 0.88 mg/dL (ref 0.60–1.10)
GFR, Est AFR Am: >60 mL/min (ref >60)
GFR, Estimated: >60 mL/min (ref >60)
GLUCOSE: 89 mg/dL (ref 70–140)
POTASSIUM: 4.2 mmol/L (ref 3.5–5.1)
SODIUM: 138 mmol/L (ref 136–145)
TOTAL PROTEIN: 7 g/dL (ref 6.4–8.3)
Total Bilirubin: 0.5 mg/dL (ref 0.2–1.2)

## 2017-06-05 MED ORDER — TRASTUZUMAB CHEMO 150 MG IV SOLR
300.0000 mg | Freq: Once | INTRAVENOUS | Status: AC
  Administered 2017-06-05: 300 mg via INTRAVENOUS
  Filled 2017-06-05: qty 14.29

## 2017-06-05 MED ORDER — ACETAMINOPHEN 325 MG PO TABS
650.0000 mg | ORAL_TABLET | Freq: Once | ORAL | Status: AC
  Administered 2017-06-05: 650 mg via ORAL

## 2017-06-05 MED ORDER — ACETAMINOPHEN 325 MG PO TABS
ORAL_TABLET | ORAL | Status: AC
  Filled 2017-06-05: qty 2

## 2017-06-05 MED ORDER — HEPARIN SOD (PORK) LOCK FLUSH 100 UNIT/ML IV SOLN
500.0000 [IU] | Freq: Once | INTRAVENOUS | Status: AC | PRN
  Administered 2017-06-05: 500 [IU]
  Filled 2017-06-05: qty 5

## 2017-06-05 MED ORDER — SODIUM CHLORIDE 0.9% FLUSH
10.0000 mL | INTRAVENOUS | Status: DC | PRN
  Administered 2017-06-05: 10 mL
  Filled 2017-06-05: qty 10

## 2017-06-05 MED ORDER — SODIUM CHLORIDE 0.9 % IV SOLN
Freq: Once | INTRAVENOUS | Status: AC
  Administered 2017-06-05: 11:00:00 via INTRAVENOUS

## 2017-06-05 NOTE — Patient Instructions (Signed)
[facility] Discharge Instructions for Patients Receiving Chemotherapy Today you received the following chemotherapy agents:  Herceptin To help prevent nausea and vomiting after your treatment, we encourage you to take your nausea medication as prescribed.   If you develop nausea and vomiting that is not controlled by your nausea medication, call the clinic.   BELOW ARE SYMPTOMS THAT SHOULD BE REPORTED IMMEDIATELY:  *FEVER GREATER THAN 100.5 F  *CHILLS WITH OR WITHOUT FEVER  NAUSEA AND VOMITING THAT IS NOT CONTROLLED WITH YOUR NAUSEA MEDICATION  *UNUSUAL SHORTNESS OF BREATH  *UNUSUAL BRUISING OR BLEEDING  TENDERNESS IN MOUTH AND THROAT WITH OR WITHOUT PRESENCE OF ULCERS  *URINARY PROBLEMS  *BOWEL PROBLEMS  UNUSUAL RASH Items with * indicate a potential emergency and should be followed up as soon as possible.  Feel free to call the clinic should you have any questions or concerns. The clinic phone number is [phone].  Please show the CHEMO ALERT CARD at check-in to the Emergency Department and triage nurse.   

## 2017-06-06 ENCOUNTER — Ambulatory Visit: Payer: 59

## 2017-06-06 ENCOUNTER — Ambulatory Visit
Admission: RE | Admit: 2017-06-06 | Discharge: 2017-06-06 | Disposition: A | Discharge status: Home / Self Care | Payer: 59 | Source: Ambulatory Visit | Attending: Radiation Oncology | Admitting: Radiation Oncology

## 2017-06-06 DIAGNOSIS — C50511 Malignant neoplasm of lower-outer quadrant of right female breast: Secondary | ICD-10-CM | POA: Diagnosis not present

## 2017-06-07 ENCOUNTER — Ambulatory Visit: Admission: RE | Admit: 2017-06-07 | Payer: 59 | Source: Ambulatory Visit

## 2017-06-08 ENCOUNTER — Encounter: Payer: Self-pay | Admitting: Radiation Oncology

## 2017-06-08 NOTE — Progress Notes (Signed)
  Radiation Oncology         [phone] ________________________________  Name: Katie Marquez MRN: [id number]  Date: 06/08/2017  DOB: 1956/01/27  End of Treatment Note  Diagnosis:   StageIAClinical T1bN0M0, Pathologic pT1c pN0 cM0RightBreast LOQ Invasive Ductal Carcinoma, ER95%/ PR 50%/ Her2 positive, Grade2 and high grade DCIS.     Indication for treatment:  Curative       Radiation treatment dates:   05/09/17 - 06/06/17  Site/dose:   40.05 Gy directed to the right breast in 15 fractions followed by boost of 10 Gy in 5 fractions.  Beams/energy:   Breast: 3D/6X Boost: isodose/6X  Narrative: The patient tolerated radiation treatment relatively well.   She denies any fatigue or changes in daily activity. She  denies any swelling or issues with range of motion. On the assessment skin appears smooth with mild redness and hyperpigmentation, and a well approximated incision.  Plan: The patient has completed radiation treatment. The patient will return to radiation oncology clinic for routine followup in one month. I advised them to call or return sooner if they have any questions or concerns related to their recovery or treatment.  -----------------------------------  Eppie Gibson, MD  This document serves as a record of services personally performed by Eppie Gibson MD. It was created on her behalf by Delton Coombes, a trained medical scribe. The creation of this record is based on the scribe's personal observations and the provider's statements to them.

## 2017-06-21 ENCOUNTER — Other Ambulatory Visit: Payer: Self-pay

## 2017-06-21 DIAGNOSIS — C50511 Malignant neoplasm of lower-outer quadrant of right female breast: Secondary | ICD-10-CM

## 2017-06-21 DIAGNOSIS — Z170 Estrogen receptor positive status [ER+]: Principal | ICD-10-CM

## 2017-06-22 ENCOUNTER — Telehealth: Payer: Self-pay

## 2017-06-22 NOTE — Telephone Encounter (Signed)
Katie Marquez called me today and reported increased irritation, redness and peeling to her right nipple area, however she does deny drainage at this time. She reports this area has gotten worse since completing radiation on 06/06/17. I advised her to apply neosporin to this area, and keep the area open to air as much as possible. I will notify Dr. Isidore Moos of the patients concerns as well. She voiced her appreciation for the advice and will call in one week if the area has not improved.

## 2017-06-26 ENCOUNTER — Inpatient Hospital Stay: Payer: 59

## 2017-06-26 ENCOUNTER — Other Ambulatory Visit: Payer: Self-pay | Admitting: Hematology and Oncology

## 2017-06-26 VITALS — BP 128/85 | HR 66 | Temp 98.2°F | Resp 16

## 2017-06-26 DIAGNOSIS — Z170 Estrogen receptor positive status [ER+]: Principal | ICD-10-CM

## 2017-06-26 DIAGNOSIS — C50511 Malignant neoplasm of lower-outer quadrant of right female breast: Secondary | ICD-10-CM | POA: Diagnosis not present

## 2017-06-26 LAB — CBC WITH DIFFERENTIAL (CANCER CENTER ONLY)
BASOS PCT: 0 %
Basophils Absolute: 0 10*3/uL (ref 0.0–0.1)
Eosinophils Absolute: 0.2 10*3/uL (ref 0.0–0.5)
Eosinophils Relative: 3 %
HEMATOCRIT: 41.4 % (ref 34.8–46.6)
HEMOGLOBIN: 14 g/dL (ref 11.6–15.9)
LYMPHS ABS: 1.2 10*3/uL (ref 0.9–3.3)
Lymphocytes Relative: 20 %
MCH: 29.9 pg (ref 25.1–34.0)
MCHC: 33.8 g/dL (ref 31.5–36.0)
MCV: 88.3 fL (ref 79.5–101.0)
MONOS PCT: 7 %
Monocytes Absolute: 0.5 10*3/uL (ref 0.1–0.9)
NEUTROS ABS: 4.3 10*3/uL (ref 1.5–6.5)
NEUTROS PCT: 70 %
Platelet Count: 215 10*3/uL (ref 145–400)
RBC: 4.69 MIL/uL (ref 3.70–5.45)
RDW: 12.2 % (ref 11.2–14.5)
WBC Count: 6.2 10*3/uL (ref 3.9–10.3)

## 2017-06-26 LAB — COMPREHENSIVE METABOLIC PANEL
ALBUMIN: 4.1 g/dL (ref 3.5–5.0)
ALK PHOS: 56 U/L (ref 38–126)
ALT: 22 U/L (ref 14–54)
ANION GAP: 9 (ref 5–15)
AST: 23 U/L (ref 15–41)
BILIRUBIN TOTAL: 0.5 mg/dL (ref 0.3–1.2)
BUN: 16 mg/dL (ref 6–20)
CALCIUM: 9.5 mg/dL (ref 8.9–10.3)
CO2: 27 mmol/L (ref 22–32)
CREATININE: 0.81 mg/dL (ref 0.44–1.00)
Chloride: 103 mmol/L (ref 101–111)
GFR calc Af Amer: >60 mL/min (ref >60)
GFR calc non Af Amer: >60 mL/min (ref >60)
GLUCOSE: 101 mg/dL — AB (ref 65–99)
Potassium: 4.4 mmol/L (ref 3.5–5.1)
Sodium: 139 mmol/L (ref 135–145)
TOTAL PROTEIN: 6.4 g/dL — AB (ref 6.5–8.1)

## 2017-06-26 MED ORDER — ACETAMINOPHEN 325 MG PO TABS
650.0000 mg | ORAL_TABLET | Freq: Once | ORAL | Status: AC
  Administered 2017-06-26: 650 mg via ORAL

## 2017-06-26 MED ORDER — SODIUM CHLORIDE 0.9% FLUSH
10.0000 mL | INTRAVENOUS | Status: DC | PRN
  Administered 2017-06-26: 10 mL
  Filled 2017-06-26: qty 10

## 2017-06-26 MED ORDER — SODIUM CHLORIDE 0.9 % IV SOLN
Freq: Once | INTRAVENOUS | Status: AC
  Administered 2017-06-26: 11:00:00 via INTRAVENOUS

## 2017-06-26 MED ORDER — ACETAMINOPHEN 325 MG PO TABS
ORAL_TABLET | ORAL | Status: AC
  Filled 2017-06-26: qty 2

## 2017-06-26 MED ORDER — TRASTUZUMAB CHEMO 150 MG IV SOLR
300.0000 mg | Freq: Once | INTRAVENOUS | Status: AC
  Administered 2017-06-26: 300 mg via INTRAVENOUS
  Filled 2017-06-26: qty 14.3

## 2017-06-26 MED ORDER — HEPARIN SOD (PORK) LOCK FLUSH 100 UNIT/ML IV SOLN
500.0000 [IU] | Freq: Once | INTRAVENOUS | Status: AC | PRN
  Administered 2017-06-26: 500 [IU]
  Filled 2017-06-26: qty 5

## 2017-06-26 NOTE — Patient Instructions (Signed)
[facility] Center Discharge Instructions for Patients Receiving Chemotherapy  Today you received the following chemotherapy agents herceptin   To help prevent nausea and vomiting after your treatment, we encourage you to take your nausea medication as directed   If you develop nausea and vomiting that is not controlled by your nausea medication, call the clinic.   BELOW ARE SYMPTOMS THAT SHOULD BE REPORTED IMMEDIATELY:  *FEVER GREATER THAN 100.5 F  *CHILLS WITH OR WITHOUT FEVER  NAUSEA AND VOMITING THAT IS NOT CONTROLLED WITH YOUR NAUSEA MEDICATION  *UNUSUAL SHORTNESS OF BREATH  *UNUSUAL BRUISING OR BLEEDING  TENDERNESS IN MOUTH AND THROAT WITH OR WITHOUT PRESENCE OF ULCERS  *URINARY PROBLEMS  *BOWEL PROBLEMS  UNUSUAL RASH Items with * indicate a potential emergency and should be followed up as soon as possible.  Feel free to call the clinic you have any questions or concerns. The clinic phone number is [phone].  

## 2017-07-02 ENCOUNTER — Other Ambulatory Visit: Payer: Self-pay | Admitting: *Deleted

## 2017-07-02 MED ORDER — TAMOXIFEN CITRATE 20 MG PO TABS: 20.0000 mg | ORAL_TABLET | Freq: Every day | ORAL | 3 refills | Status: DC

## 2017-07-05 ENCOUNTER — Encounter: Payer: Self-pay | Admitting: Radiation Oncology

## 2017-07-05 NOTE — Progress Notes (Signed)
Katie Marquez presents for follow up of radiation completed 06/06/17 to her right breast. She denies pain. She denies fatigue, but is sleeping 10-11 hours nightly. Her right breast is hyperpigmented and she had peeling to her right nipple during her recovery. She is not using radiaplex at this time, but I encouraged her to complete her tube and then switch to a vitamin E containing lotion when the radiaplex is completed.  She continues to receive trastuzumab every 3 weeks with her next treatment 07/17/17. She saw Dr. Jana Hakim on 06/05/17 and started tamoxifen 06/30/17.   BP (!) 134/94   Pulse 66   Temp 98.4 F (36.9 C)   Resp 18   Wt 116 lb 12.8 oz (53 kg)   SpO2 100%   BMI 21.36 kg/m    Wt Readings from Last 3 Encounters:  07/13/17 116 lb 12.8 oz (53 kg)  06/05/17 117 lb 4.8 oz (53.2 kg)  04/24/17 118 lb 3.2 oz (53.6 kg)

## 2017-07-13 ENCOUNTER — Encounter: Payer: Self-pay | Admitting: Radiation Oncology

## 2017-07-13 ENCOUNTER — Other Ambulatory Visit: Payer: Self-pay

## 2017-07-13 ENCOUNTER — Ambulatory Visit
Admission: RE | Admit: 2017-07-13 | Discharge: 2017-07-13 | Disposition: A | Discharge status: Home / Self Care | Payer: 59 | Source: Ambulatory Visit | Attending: Radiation Oncology | Admitting: Radiation Oncology

## 2017-07-13 VITALS — BP 134/94 | HR 66 | Temp 98.4°F | Resp 18 | Wt 116.8 lb

## 2017-07-13 DIAGNOSIS — Z79899 Other long term (current) drug therapy: Secondary | ICD-10-CM | POA: Diagnosis not present

## 2017-07-13 DIAGNOSIS — Z79810 Long term (current) use of selective estrogen receptor modulators (SERMs): Secondary | ICD-10-CM | POA: Insufficient documentation

## 2017-07-13 DIAGNOSIS — C50511 Malignant neoplasm of lower-outer quadrant of right female breast: Secondary | ICD-10-CM

## 2017-07-13 DIAGNOSIS — Z08 Encounter for follow-up examination after completed treatment for malignant neoplasm: Secondary | ICD-10-CM | POA: Diagnosis not present

## 2017-07-13 DIAGNOSIS — Z853 Personal history of malignant neoplasm of breast: Secondary | ICD-10-CM | POA: Diagnosis not present

## 2017-07-13 DIAGNOSIS — Z170 Estrogen receptor positive status [ER+]: Secondary | ICD-10-CM | POA: Diagnosis present

## 2017-07-13 HISTORY — DX: Personal history of irradiation: Z92.3

## 2017-07-13 NOTE — Progress Notes (Signed)
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 Carcinoma of lower-outer quadrant of right breast in female, estrogen receptor positive ([place]) C50.511    Z17.0    Previous Radiation:  40.05 Gy directed to the right breast in 15 fractions followed by boost of 10 Gy in 5 fractions completed on 05/09/17 - 06/06/17  Narrative:  The patient returns today for routine follow-up.  She is doing well. She denies any new palpable lumps or bumps in her breasts. She continues to be followed by medical oncology and is taking trastuzumad every 3 weeks, her next treatment is 07/17/17. She saw Dr. Jana Hakim on 06/05/17 and started on tamoxifen 06/30/17  She denies pain. She denies fatigue, but is sleeping 10-11 hours nightly. Her right breast is hyperpigmented and she had peeling to her right nipple during her recovery. She is not using radiaplex at this time.                             ALLERGIES:  has No Known Allergies.   Meds: Current Outpatient Medications  Medication Sig Dispense Refill  . CALCIUM PO Take by mouth daily.    . cholecalciferol (VITAMIN D) 1000 units tablet Take 2,000 Units by mouth daily.    Marland Kitchen FLUoxetine (PROZAC) 10 MG capsule Take 20 mg by mouth daily.     Marland Kitchen lisinopril (PRINIVIL,ZESTRIL) 10 MG tablet Take 1 tablet (10 mg total) by mouth daily. 30 tablet 3  . Probiotic Product (PROBIOTIC-10 PO) Take by mouth.    . Red Yeast Rice 600 MG CAPS Take by mouth.    . tamoxifen (NOLVADEX) 20 MG tablet Take 1 tablet (20 mg total) by mouth daily. 30 tablet 3   No current facility-administered medications for this encounter.      Physical Findings: The patient is in no acute distress. Patient is alert and oriented.  weight is 116 lb 12.8 oz (53 kg). Her temperature is 98.4 F (36.9 C). Her blood pressure is 134/94 (abnormal) and her pulse is 66. Her respiration is 18 and oxygen saturation is 100%. .  Right breast has some modest hyperpigmentation, particularly in the areolar area, skin is intact.   Lab Findings: Lab Results  Component Value Date   WBC 6.2 06/26/2017   HGB 14.0 06/26/2017   HCT 41.4 06/26/2017   MCV 88.3 06/26/2017   PLT 215 06/26/2017    '@LASTCHEMISTRY'$ @  Radiographic Findings: No results found.   Impression/Plan: This is a very pleasant woman with a history of right breast cancer.  She knows continue yearly mammography. I will see her back PRN. I encouraged her to use a vitamin E containing lotion and oil for a couple time a days for a couple weeks for more healing. I encouraged her to call if she has any issues in the interim. She will see Dr. Jana Hakim 10/30/17    _____________________________________   Eppie Gibson, MD  This document serves as a record of services personally performed by Eppie Gibson MD. It was created on her behalf by Delton Coombes, a trained medical scribe. The creation of this record is  based on the scribe's personal observations and the provider's statements to them.

## 2017-07-16 ENCOUNTER — Other Ambulatory Visit: Payer: Self-pay

## 2017-07-16 DIAGNOSIS — Z170 Estrogen receptor positive status [ER+]: Principal | ICD-10-CM

## 2017-07-16 DIAGNOSIS — C50511 Malignant neoplasm of lower-outer quadrant of right female breast: Secondary | ICD-10-CM

## 2017-07-17 ENCOUNTER — Inpatient Hospital Stay: Payer: 59 | Attending: Adult Health

## 2017-07-17 ENCOUNTER — Inpatient Hospital Stay: Payer: 59

## 2017-07-17 VITALS — BP 130/82 | HR 63 | Temp 98.3°F | Resp 18 | Wt 116.5 lb

## 2017-07-17 DIAGNOSIS — Z5112 Encounter for antineoplastic immunotherapy: Secondary | ICD-10-CM | POA: Diagnosis not present

## 2017-07-17 DIAGNOSIS — Z170 Estrogen receptor positive status [ER+]: Secondary | ICD-10-CM | POA: Diagnosis not present

## 2017-07-17 DIAGNOSIS — C50511 Malignant neoplasm of lower-outer quadrant of right female breast: Secondary | ICD-10-CM | POA: Diagnosis not present

## 2017-07-17 LAB — CBC WITH DIFFERENTIAL (CANCER CENTER ONLY)
Basophils Absolute: 0 10*3/uL (ref 0.0–0.1)
Basophils Relative: 0 %
EOS ABS: 0.2 10*3/uL (ref 0.0–0.5)
EOS PCT: 3 %
HCT: 41.9 % (ref 34.8–46.6)
Hemoglobin: 14.1 g/dL (ref 11.6–15.9)
LYMPHS ABS: 1.4 10*3/uL (ref 0.9–3.3)
Lymphocytes Relative: 26 %
MCH: 29.3 pg (ref 25.1–34.0)
MCHC: 33.7 g/dL (ref 31.5–36.0)
MCV: 86.9 fL (ref 79.5–101.0)
Monocytes Absolute: 0.4 10*3/uL (ref 0.1–0.9)
Monocytes Relative: 8 %
Neutro Abs: 3.3 10*3/uL (ref 1.5–6.5)
Neutrophils Relative %: 63 %
PLATELETS: 249 10*3/uL (ref 145–400)
RBC: 4.82 MIL/uL (ref 3.70–5.45)
RDW: 12.3 % (ref 11.2–14.5)
WBC: 5.4 10*3/uL (ref 3.9–10.3)

## 2017-07-17 LAB — CMP (CANCER CENTER ONLY)
ALT: 20 U/L (ref 0–55)
ANION GAP: 6 (ref 3–11)
AST: 24 U/L (ref 5–34)
Albumin: 4 g/dL (ref 3.5–5.0)
Alkaline Phosphatase: 61 U/L (ref 40–150)
BILIRUBIN TOTAL: 0.4 mg/dL (ref 0.2–1.2)
BUN: 21 mg/dL (ref 7–26)
CHLORIDE: 104 mmol/L (ref 98–109)
CO2: 29 mmol/L (ref 22–29)
Calcium: 9.8 mg/dL (ref 8.4–10.4)
Creatinine: 0.84 mg/dL (ref 0.60–1.10)
GFR, Est AFR Am: >60 mL/min (ref >60)
Glucose, Bld: 92 mg/dL (ref 70–140)
POTASSIUM: 4.4 mmol/L (ref 3.5–5.1)
Sodium: 139 mmol/L (ref 136–145)
TOTAL PROTEIN: 6.7 g/dL (ref 6.4–8.3)

## 2017-07-17 MED ORDER — ACETAMINOPHEN 325 MG PO TABS
ORAL_TABLET | ORAL | Status: AC
  Filled 2017-07-17: qty 2

## 2017-07-17 MED ORDER — SODIUM CHLORIDE 0.9% FLUSH
10.0000 mL | INTRAVENOUS | Status: DC | PRN
  Administered 2017-07-17: 10 mL
  Filled 2017-07-17: qty 10

## 2017-07-17 MED ORDER — TRASTUZUMAB CHEMO 150 MG IV SOLR
300.0000 mg | Freq: Once | INTRAVENOUS | Status: AC
  Administered 2017-07-17: 300 mg via INTRAVENOUS
  Filled 2017-07-17: qty 14.29

## 2017-07-17 MED ORDER — SODIUM CHLORIDE 0.9 % IV SOLN
Freq: Once | INTRAVENOUS | Status: AC
  Administered 2017-07-17: 11:00:00 via INTRAVENOUS

## 2017-07-17 MED ORDER — HEPARIN SOD (PORK) LOCK FLUSH 100 UNIT/ML IV SOLN
500.0000 [IU] | Freq: Once | INTRAVENOUS | Status: AC | PRN
  Administered 2017-07-17: 500 [IU]
  Filled 2017-07-17: qty 5

## 2017-07-17 MED ORDER — ACETAMINOPHEN 325 MG PO TABS
650.0000 mg | ORAL_TABLET | Freq: Once | ORAL | Status: AC
  Administered 2017-07-17: 650 mg via ORAL

## 2017-07-17 NOTE — Patient Instructions (Signed)
[facility] Discharge Instructions for Patients Receiving Chemotherapy  Today you received the following chemotherapy agents Herceptin  To help prevent nausea and vomiting after your treatment, we encourage you to take your nausea medication as directed   If you develop nausea and vomiting that is not controlled by your nausea medication, call the clinic.   BELOW ARE SYMPTOMS THAT SHOULD BE REPORTED IMMEDIATELY:  *FEVER GREATER THAN 100.5 F  *CHILLS WITH OR WITHOUT FEVER  NAUSEA AND VOMITING THAT IS NOT CONTROLLED WITH YOUR NAUSEA MEDICATION  *UNUSUAL SHORTNESS OF BREATH  *UNUSUAL BRUISING OR BLEEDING  TENDERNESS IN MOUTH AND THROAT WITH OR WITHOUT PRESENCE OF ULCERS  *URINARY PROBLEMS  *BOWEL PROBLEMS  UNUSUAL RASH Items with * indicate a potential emergency and should be followed up as soon as possible.  Feel free to call the clinic should you have any questions or concerns. The clinic phone number is [phone].  Please show the CHEMO ALERT CARD at check-in to the Emergency Department and triage nurse.   

## 2017-07-20 ENCOUNTER — Ambulatory Visit (HOSPITAL_BASED_OUTPATIENT_CLINIC_OR_DEPARTMENT_OTHER)
Admission: RE | Admit: 2017-07-20 | Discharge: 2017-07-20 | Disposition: A | Discharge status: Home / Self Care | Payer: 59 | Source: Ambulatory Visit | Attending: Cardiology | Admitting: Cardiology

## 2017-07-20 ENCOUNTER — Ambulatory Visit (HOSPITAL_COMMUNITY)
Admission: RE | Admit: 2017-07-20 | Discharge: 2017-07-20 | Disposition: A | Discharge status: Home / Self Care | Payer: 59 | Source: Ambulatory Visit | Attending: Family Medicine | Admitting: Family Medicine

## 2017-07-20 VITALS — BP 142/86 | HR 60 | Wt 117.5 lb

## 2017-07-20 DIAGNOSIS — I082 Rheumatic disorders of both aortic and tricuspid valves: Secondary | ICD-10-CM | POA: Insufficient documentation

## 2017-07-20 DIAGNOSIS — C50511 Malignant neoplasm of lower-outer quadrant of right female breast: Secondary | ICD-10-CM | POA: Diagnosis not present

## 2017-07-20 DIAGNOSIS — Z5181 Encounter for therapeutic drug level monitoring: Secondary | ICD-10-CM | POA: Diagnosis not present

## 2017-07-20 DIAGNOSIS — R011 Cardiac murmur, unspecified: Secondary | ICD-10-CM | POA: Insufficient documentation

## 2017-07-20 DIAGNOSIS — I313 Pericardial effusion (noninflammatory): Secondary | ICD-10-CM | POA: Insufficient documentation

## 2017-07-20 DIAGNOSIS — I10 Essential (primary) hypertension: Secondary | ICD-10-CM | POA: Diagnosis not present

## 2017-07-20 DIAGNOSIS — Z170 Estrogen receptor positive status [ER+]: Secondary | ICD-10-CM | POA: Insufficient documentation

## 2017-07-20 NOTE — Progress Notes (Signed)
  Echocardiogram 2D Echocardiogram has been performed.  Katie Marquez 07/20/2017, 11:44 AM

## 2017-07-20 NOTE — Patient Instructions (Signed)
Your physician has requested that you have an echocardiogram. Echocardiography is a painless test that uses sound waves to create images of your heart. It provides your doctor with information about the size and shape of your heart and how well your heart's chambers and valves are working. This procedure takes approximately one hour. There are no restrictions for this procedure.  Your physician recommends that you schedule a follow-up appointment in: 3 MONTHS WITH Dr. Aundra Dubin  an a echocardiogram

## 2017-07-22 NOTE — Progress Notes (Signed)
Oncology: Dr. Jana Hakim  62 yo with history of HTN and breast cancer was referred by Dr. Jana Hakim for cardio-oncology evaluation.    Right breast cancer was diagnosed 11/18, ER+/PR+/HER2+.  She had lumpectomy in 12/18.  She has completed paclitaxel x 12 cycles.  She will continue trastuzumab x 1 year.  She will have radiation.    She had an episode of syncope earlier this year in the setting of drinking wine and sitting by a hot fire. No palpitations.  She had a prodrome of warmth and lightheadedness.  She was unconscious only briefly.  At the time, Dr. Jana Hakim decreased her lisinopril to 10 mg daily but subsequently she increased it back to 20 mg daily as BP has been running high. It is borderline elevated today.     No exertional dyspnea or chest pain.  No history of cardiac problems.  Her mother died of CHF at 56. She has never smoked.   PMH: 1. Tremor 2. IBS 3. HTN 4. Breast cancer: Right breast cancer diagnosed 11/18, ER+/PR+/HER2+.  She had lumpectomy in 12/18.  She has completed paclitaxel x 12 cycles.  She will continue trastuzumab x 1 year.  She will have radiation.  - Echo (3/19): EF 60-65%, normal wall thickness.  GLS -24.9%.  - Echo (6/19): EF 60-65%, GLS -24%, no definite VSD, trivial MR, trivial AI, normal RV size and systolic function.   Social History   Socioeconomic History  . Marital status: Married    Spouse name: Not on file  . Number of children: Not on file  . Years of education: Not on file  . Highest education level: Not on file  Occupational History  . Not on file  Social Needs  . Financial resource strain: Not on file  . Food insecurity:    Worry: Not on file    Inability: Not on file  . Transportation needs:    Medical: Not on file    Non-medical: Not on file  Tobacco Use  . Smoking status: Never Smoker  . Smokeless tobacco: Never Used  Substance and Sexual Activity  . Alcohol use: Yes    Frequency: Never    Comment: occas  . Drug use: No  .  Sexual activity: Not on file  Lifestyle  . Physical activity:    Days per week: Not on file    Minutes per session: Not on file  . Stress: Not on file  Relationships  . Social connections:    Talks on phone: Not on file    Gets together: Not on file    Attends religious service: Not on file    Active member of club or organization: Not on file    Attends meetings of clubs or organizations: Not on file    Relationship status: Not on file  . Intimate partner violence:    Fear of current or ex partner: Not on file    Emotionally abused: Not on file    Physically abused: Not on file    Forced sexual activity: Not on file  Other Topics Concern  . Not on file  Social History Narrative  . Not on file   Family History  Problem Relation Age of Onset  . Rheum arthritis Mother   . Hyperlipidemia Mother   . Hypertension Mother   . Thyroid cancer Mother 67  . Diverticulitis Father   . Parkinson's disease Father   . Kidney cancer Father 45  . Multiple sclerosis Maternal Aunt   . Breast  cancer Neg Hx    ROS: All systems reviewed and negative except as per HPI.   Current Outpatient Medications  Medication Sig Dispense Refill  . CALCIUM PO Take by mouth daily.    . cholecalciferol (VITAMIN D) 1000 units tablet Take 2,000 Units by mouth daily.    Marland Kitchen FLUoxetine (PROZAC) 10 MG capsule Take 20 mg by mouth daily.     Marland Kitchen lisinopril (PRINIVIL,ZESTRIL) 10 MG tablet Take 1 tablet (10 mg total) by mouth daily. 30 tablet 3  . Probiotic Product (PROBIOTIC-10 PO) Take by mouth.    . Red Yeast Rice 600 MG CAPS Take by mouth.    . tamoxifen (NOLVADEX) 20 MG tablet Take 1 tablet (20 mg total) by mouth daily. 30 tablet 3   No current facility-administered medications for this encounter.    BP (!) 142/86   Pulse 60   Wt 117 lb 8 oz (53.3 kg)   SpO2 93%   BMI 21.49 kg/m  General: NAD Neck: No JVD, no thyromegaly or thyroid nodule.  Lungs: Clear to auscultation bilaterally with normal respiratory  effort. CV: Nondisplaced PMI.  Heart regular S1/S2, no S3/S4, 1/6 early SEM RUSB.  No peripheral edema.  No carotid bruit.  Normal pedal pulses.  Abdomen: Soft, nontender, no hepatosplenomegaly, no distention.  Skin: Intact without lesions or rashes.  Neurologic: Alert and oriented x 3.  Psych: Normal affect. Extremities: No clubbing or cyanosis.  HEENT: Normal.   Assessment/Plan: 1. HTN: Borderline BP today.  - Continue lisinopril 20 mg daily for now.  If BP continues to run high, I will have her increase it to 40 mg daily.  2. Breast cancer: She will be on Herceptin to 12/18.  Echo today was reviewed, normal EF and strain pattern.   - She can continue Herceptin as scheduled and we will repeat an echo in 3 months.  3. Abnormal echo: Echo from 12/18 describes moderate focal basal septal hypertrophy with mitral valve SAM, which suggests hypertrophic cardiomyopathy, and a supracristal VSD was reported. Today's echo and the 3/19 echo do not show LVH or mitral valve SAM, and no VSD is visualized. She also has a very minimal murmur on exam which does not suggest a VSD or HCM with LVOT obstruction.  I do not think that she has significant abnormalities but will continue to follow her echoes closely while she is on Herceptin.   Loralie Champagne 07/22/2017

## 2017-07-25 DIAGNOSIS — H2513 Age-related nuclear cataract, bilateral: Secondary | ICD-10-CM | POA: Diagnosis not present

## 2017-07-26 DIAGNOSIS — H6123 Impacted cerumen, bilateral: Secondary | ICD-10-CM | POA: Diagnosis not present

## 2017-08-06 DIAGNOSIS — H6123 Impacted cerumen, bilateral: Secondary | ICD-10-CM | POA: Diagnosis not present

## 2017-08-07 ENCOUNTER — Inpatient Hospital Stay: Payer: 59 | Attending: Adult Health

## 2017-08-07 ENCOUNTER — Inpatient Hospital Stay: Payer: 59

## 2017-08-07 ENCOUNTER — Telehealth: Payer: Self-pay | Admitting: Oncology

## 2017-08-07 VITALS — BP 136/93 | HR 58 | Temp 98.9°F | Resp 17

## 2017-08-07 DIAGNOSIS — Z170 Estrogen receptor positive status [ER+]: Secondary | ICD-10-CM | POA: Insufficient documentation

## 2017-08-07 DIAGNOSIS — C50511 Malignant neoplasm of lower-outer quadrant of right female breast: Secondary | ICD-10-CM

## 2017-08-07 DIAGNOSIS — Z5112 Encounter for antineoplastic immunotherapy: Secondary | ICD-10-CM | POA: Diagnosis not present

## 2017-08-07 LAB — CMP (CANCER CENTER ONLY)
ALBUMIN: 4 g/dL (ref 3.5–5.0)
ALK PHOS: 55 U/L (ref 38–126)
ALT: 17 U/L (ref 0–44)
ANION GAP: 6 (ref 5–15)
AST: 17 U/L (ref 15–41)
BUN: 16 mg/dL (ref 8–23)
CHLORIDE: 101 mmol/L (ref 98–111)
CO2: 31 mmol/L (ref 22–32)
Calcium: 9.9 mg/dL (ref 8.9–10.3)
Creatinine: 0.93 mg/dL (ref 0.44–1.00)
GFR, Estimated: >60 mL/min (ref >60)
GLUCOSE: 95 mg/dL (ref 70–99)
Potassium: 4.4 mmol/L (ref 3.5–5.1)
SODIUM: 138 mmol/L (ref 135–145)
Total Bilirubin: 0.4 mg/dL (ref 0.3–1.2)
Total Protein: 6.8 g/dL (ref 6.5–8.1)

## 2017-08-07 LAB — CBC WITH DIFFERENTIAL (CANCER CENTER ONLY)
Basophils Absolute: 0 10*3/uL (ref 0.0–0.1)
Basophils Relative: 0 %
Eosinophils Absolute: 0.1 10*3/uL (ref 0.0–0.5)
Eosinophils Relative: 2 %
HEMATOCRIT: 42.4 % (ref 34.8–46.6)
HEMOGLOBIN: 14.6 g/dL (ref 11.6–15.9)
LYMPHS ABS: 1.4 10*3/uL (ref 0.9–3.3)
Lymphocytes Relative: 22 %
MCH: 29.5 pg (ref 25.1–34.0)
MCHC: 34.4 g/dL (ref 31.5–36.0)
MCV: 85.7 fL (ref 79.5–101.0)
MONOS PCT: 6 %
Monocytes Absolute: 0.4 10*3/uL (ref 0.1–0.9)
NEUTROS ABS: 4.4 10*3/uL (ref 1.5–6.5)
NEUTROS PCT: 70 %
Platelet Count: 229 10*3/uL (ref 145–400)
RBC: 4.95 MIL/uL (ref 3.70–5.45)
RDW: 12.5 % (ref 11.2–14.5)
WBC Count: 6.4 10*3/uL (ref 3.9–10.3)

## 2017-08-07 MED ORDER — HEPARIN SOD (PORK) LOCK FLUSH 100 UNIT/ML IV SOLN
500.0000 [IU] | Freq: Once | INTRAVENOUS | Status: AC | PRN
  Administered 2017-08-07: 500 [IU]
  Filled 2017-08-07: qty 5

## 2017-08-07 MED ORDER — SODIUM CHLORIDE 0.9 % IV SOLN
Freq: Once | INTRAVENOUS | Status: AC
  Administered 2017-08-07: 11:00:00 via INTRAVENOUS

## 2017-08-07 MED ORDER — TRASTUZUMAB CHEMO 150 MG IV SOLR
300.0000 mg | Freq: Once | INTRAVENOUS | Status: AC
  Administered 2017-08-07: 300 mg via INTRAVENOUS
  Filled 2017-08-07: qty 14.29

## 2017-08-07 MED ORDER — ACETAMINOPHEN 325 MG PO TABS
ORAL_TABLET | ORAL | Status: AC
  Filled 2017-08-07: qty 2

## 2017-08-07 MED ORDER — ACETAMINOPHEN 325 MG PO TABS
650.0000 mg | ORAL_TABLET | Freq: Once | ORAL | Status: AC
  Administered 2017-08-07: 650 mg via ORAL

## 2017-08-07 MED ORDER — SODIUM CHLORIDE 0.9% FLUSH
10.0000 mL | INTRAVENOUS | Status: DC | PRN
  Administered 2017-08-07: 10 mL
  Filled 2017-08-07: qty 10

## 2017-08-07 NOTE — Patient Instructions (Signed)
[facility] Discharge Instructions for Patients Receiving Chemotherapy  Today you received the following chemotherapy agents Herceptin  To help prevent nausea and vomiting after your treatment, we encourage you to take your nausea medication as directed   If you develop nausea and vomiting that is not controlled by your nausea medication, call the clinic.   BELOW ARE SYMPTOMS THAT SHOULD BE REPORTED IMMEDIATELY:  *FEVER GREATER THAN 100.5 F  *CHILLS WITH OR WITHOUT FEVER  NAUSEA AND VOMITING THAT IS NOT CONTROLLED WITH YOUR NAUSEA MEDICATION  *UNUSUAL SHORTNESS OF BREATH  *UNUSUAL BRUISING OR BLEEDING  TENDERNESS IN MOUTH AND THROAT WITH OR WITHOUT PRESENCE OF ULCERS  *URINARY PROBLEMS  *BOWEL PROBLEMS  UNUSUAL RASH Items with * indicate a potential emergency and should be followed up as soon as possible.  Feel free to call the clinic should you have any questions or concerns. The clinic phone number is [phone].  Please show the CHEMO ALERT CARD at check-in to the Emergency Department and triage nurse.   

## 2017-08-07 NOTE — Telephone Encounter (Signed)
Patient requested to r/s upcoming September appts to earlier date. Ok per [employer].

## 2017-08-28 ENCOUNTER — Ambulatory Visit: Payer: 59

## 2017-08-28 ENCOUNTER — Other Ambulatory Visit: Payer: 59

## 2017-08-31 ENCOUNTER — Inpatient Hospital Stay: Payer: 59

## 2017-08-31 ENCOUNTER — Inpatient Hospital Stay: Payer: 59 | Attending: Adult Health

## 2017-08-31 VITALS — BP 135/88 | HR 64 | Temp 99.0°F | Resp 18

## 2017-08-31 DIAGNOSIS — Z95828 Presence of other vascular implants and grafts: Secondary | ICD-10-CM

## 2017-08-31 DIAGNOSIS — C50511 Malignant neoplasm of lower-outer quadrant of right female breast: Secondary | ICD-10-CM

## 2017-08-31 DIAGNOSIS — Z170 Estrogen receptor positive status [ER+]: Secondary | ICD-10-CM

## 2017-08-31 DIAGNOSIS — Z5112 Encounter for antineoplastic immunotherapy: Secondary | ICD-10-CM | POA: Insufficient documentation

## 2017-08-31 LAB — CMP (CANCER CENTER ONLY)
ALT: 17 U/L (ref 0–44)
AST: 15 U/L (ref 15–41)
Albumin: 3.4 g/dL — ABNORMAL LOW (ref 3.5–5.0)
Alkaline Phosphatase: 49 U/L (ref 38–126)
Anion gap: 8 (ref 5–15)
BILIRUBIN TOTAL: 0.4 mg/dL (ref 0.3–1.2)
BUN: 16 mg/dL (ref 8–23)
CO2: 27 mmol/L (ref 22–32)
CREATININE: 0.78 mg/dL (ref 0.44–1.00)
Calcium: 9.5 mg/dL (ref 8.9–10.3)
Chloride: 104 mmol/L (ref 98–111)
GFR, Est AFR Am: >60 mL/min (ref >60)
Glucose, Bld: 99 mg/dL (ref 70–99)
Potassium: 4.2 mmol/L (ref 3.5–5.1)
Sodium: 139 mmol/L (ref 135–145)
TOTAL PROTEIN: 6.7 g/dL (ref 6.5–8.1)

## 2017-08-31 LAB — CBC WITH DIFFERENTIAL (CANCER CENTER ONLY)
BASOS ABS: 0.1 10*3/uL (ref 0.0–0.1)
Basophils Relative: 1 %
Eosinophils Absolute: 0.2 10*3/uL (ref 0.0–0.5)
Eosinophils Relative: 2 %
HEMATOCRIT: 38.8 % (ref 34.8–46.6)
Hemoglobin: 13.2 g/dL (ref 11.6–15.9)
Lymphocytes Relative: 13 %
Lymphs Abs: 1.2 10*3/uL (ref 0.9–3.3)
MCH: 28.6 pg (ref 25.1–34.0)
MCHC: 33.9 g/dL (ref 31.5–36.0)
MCV: 84.4 fL (ref 79.5–101.0)
Monocytes Absolute: 0.8 10*3/uL (ref 0.1–0.9)
Monocytes Relative: 9 %
Neutro Abs: 6.8 10*3/uL — ABNORMAL HIGH (ref 1.5–6.5)
Neutrophils Relative %: 75 %
Platelet Count: 289 10*3/uL (ref 145–400)
RBC: 4.6 MIL/uL (ref 3.70–5.45)
RDW: 13.3 % (ref 11.2–14.5)
WBC Count: 9 10*3/uL (ref 3.9–10.3)

## 2017-08-31 MED ORDER — TRASTUZUMAB CHEMO 150 MG IV SOLR
300.0000 mg | Freq: Once | INTRAVENOUS | Status: AC
  Administered 2017-08-31: 300 mg via INTRAVENOUS
  Filled 2017-08-31: qty 14.3

## 2017-08-31 MED ORDER — SODIUM CHLORIDE 0.9% FLUSH
10.0000 mL | Freq: Once | INTRAVENOUS | Status: AC
  Administered 2017-08-31: 10 mL
  Filled 2017-08-31: qty 10

## 2017-08-31 MED ORDER — SODIUM CHLORIDE 0.9% FLUSH
10.0000 mL | INTRAVENOUS | Status: DC | PRN
  Administered 2017-08-31: 10 mL
  Filled 2017-08-31: qty 10

## 2017-08-31 MED ORDER — SODIUM CHLORIDE 0.9 % IV SOLN
Freq: Once | INTRAVENOUS | Status: AC
  Administered 2017-08-31: 13:00:00 via INTRAVENOUS
  Filled 2017-08-31: qty 250

## 2017-08-31 MED ORDER — ACETAMINOPHEN 325 MG PO TABS
650.0000 mg | ORAL_TABLET | Freq: Once | ORAL | Status: AC
  Administered 2017-08-31: 650 mg via ORAL

## 2017-08-31 MED ORDER — ACETAMINOPHEN 325 MG PO TABS
ORAL_TABLET | ORAL | Status: AC
  Filled 2017-08-31: qty 2

## 2017-08-31 MED ORDER — HEPARIN SOD (PORK) LOCK FLUSH 100 UNIT/ML IV SOLN
500.0000 [IU] | Freq: Once | INTRAVENOUS | Status: AC | PRN
Start: 2017-08-31 — End: 2017-08-31
  Administered 2017-08-31: 500 [IU]
  Filled 2017-08-31: qty 5

## 2017-08-31 NOTE — Patient Instructions (Signed)
[facility] Discharge Instructions for Patients Receiving Chemotherapy Today you received the following chemotherapy agents:  Herceptin To help prevent nausea and vomiting after your treatment, we encourage you to take your nausea medication as prescribed.   If you develop nausea and vomiting that is not controlled by your nausea medication, call the clinic.   BELOW ARE SYMPTOMS THAT SHOULD BE REPORTED IMMEDIATELY:  *FEVER GREATER THAN 100.5 F  *CHILLS WITH OR WITHOUT FEVER  NAUSEA AND VOMITING THAT IS NOT CONTROLLED WITH YOUR NAUSEA MEDICATION  *UNUSUAL SHORTNESS OF BREATH  *UNUSUAL BRUISING OR BLEEDING  TENDERNESS IN MOUTH AND THROAT WITH OR WITHOUT PRESENCE OF ULCERS  *URINARY PROBLEMS  *BOWEL PROBLEMS  UNUSUAL RASH Items with * indicate a potential emergency and should be followed up as soon as possible.  Feel free to call the clinic should you have any questions or concerns. The clinic phone number is [phone].  Please show the CHEMO ALERT CARD at check-in to the Emergency Department and triage nurse.   

## 2017-09-01 DIAGNOSIS — J069 Acute upper respiratory infection, unspecified: Secondary | ICD-10-CM | POA: Diagnosis not present

## 2017-09-01 DIAGNOSIS — R05 Cough: Secondary | ICD-10-CM | POA: Diagnosis not present

## 2017-09-01 DIAGNOSIS — M7989 Other specified soft tissue disorders: Secondary | ICD-10-CM | POA: Diagnosis not present

## 2017-09-17 ENCOUNTER — Other Ambulatory Visit: Payer: Self-pay | Admitting: Oncology

## 2017-09-18 ENCOUNTER — Inpatient Hospital Stay: Payer: 59

## 2017-09-18 VITALS — BP 149/92 | HR 65 | Temp 98.4°F | Resp 16 | Ht 62.0 in | Wt 114.4 lb

## 2017-09-18 DIAGNOSIS — Z170 Estrogen receptor positive status [ER+]: Principal | ICD-10-CM

## 2017-09-18 DIAGNOSIS — C50511 Malignant neoplasm of lower-outer quadrant of right female breast: Secondary | ICD-10-CM

## 2017-09-18 LAB — CMP (CANCER CENTER ONLY)
ALK PHOS: 47 U/L (ref 38–126)
ALT: 13 U/L (ref 0–44)
AST: 14 U/L — ABNORMAL LOW (ref 15–41)
Albumin: 3.7 g/dL (ref 3.5–5.0)
Anion gap: 7 (ref 5–15)
BUN: 19 mg/dL (ref 8–23)
CHLORIDE: 103 mmol/L (ref 98–111)
CO2: 28 mmol/L (ref 22–32)
CREATININE: 0.76 mg/dL (ref 0.44–1.00)
Calcium: 9.2 mg/dL (ref 8.9–10.3)
GFR, Estimated: >60 mL/min (ref >60)
GLUCOSE: 91 mg/dL (ref 70–99)
Potassium: 4.4 mmol/L (ref 3.5–5.1)
Sodium: 138 mmol/L (ref 135–145)
Total Bilirubin: 0.4 mg/dL (ref 0.3–1.2)
Total Protein: 6.6 g/dL (ref 6.5–8.1)

## 2017-09-18 LAB — CBC WITH DIFFERENTIAL (CANCER CENTER ONLY)
BASOS PCT: 1 %
Basophils Absolute: 0 10*3/uL (ref 0.0–0.1)
EOS ABS: 0.1 10*3/uL (ref 0.0–0.5)
EOS PCT: 2 %
HCT: 39.9 % (ref 34.8–46.6)
HEMOGLOBIN: 13.8 g/dL (ref 11.6–15.9)
LYMPHS ABS: 1.1 10*3/uL (ref 0.9–3.3)
Lymphocytes Relative: 17 %
MCH: 29.3 pg (ref 25.1–34.0)
MCHC: 34.7 g/dL (ref 31.5–36.0)
MCV: 84.5 fL (ref 79.5–101.0)
Monocytes Absolute: 0.5 10*3/uL (ref 0.1–0.9)
Monocytes Relative: 8 %
NEUTROS PCT: 72 %
Neutro Abs: 4.6 10*3/uL (ref 1.5–6.5)
PLATELETS: 299 10*3/uL (ref 145–400)
RBC: 4.72 MIL/uL (ref 3.70–5.45)
RDW: 13.6 % (ref 11.2–14.5)
WBC: 6.4 10*3/uL (ref 3.9–10.3)

## 2017-09-18 MED ORDER — ACETAMINOPHEN 325 MG PO TABS
ORAL_TABLET | ORAL | Status: AC
  Filled 2017-09-18: qty 2

## 2017-09-18 MED ORDER — TRASTUZUMAB CHEMO 150 MG IV SOLR
300.0000 mg | Freq: Once | INTRAVENOUS | Status: AC
  Administered 2017-09-18: 300 mg via INTRAVENOUS
  Filled 2017-09-18: qty 14.29

## 2017-09-18 MED ORDER — HEPARIN SOD (PORK) LOCK FLUSH 100 UNIT/ML IV SOLN
500.0000 [IU] | Freq: Once | INTRAVENOUS | Status: AC | PRN
  Administered 2017-09-18: 500 [IU]
  Filled 2017-09-18: qty 5

## 2017-09-18 MED ORDER — SODIUM CHLORIDE 0.9 % IV SOLN
Freq: Once | INTRAVENOUS | Status: AC
  Administered 2017-09-18: 11:00:00 via INTRAVENOUS
  Filled 2017-09-18: qty 250

## 2017-09-18 MED ORDER — ACETAMINOPHEN 325 MG PO TABS
650.0000 mg | ORAL_TABLET | Freq: Once | ORAL | Status: AC
  Administered 2017-09-18: 650 mg via ORAL

## 2017-09-18 MED ORDER — SODIUM CHLORIDE 0.9% FLUSH
10.0000 mL | INTRAVENOUS | Status: DC | PRN
  Administered 2017-09-18: 10 mL
  Filled 2017-09-18: qty 10

## 2017-09-18 NOTE — Patient Instructions (Signed)
[facility] Discharge Instructions for Patients Receiving Chemotherapy  Today you received the following chemotherapy agents: Herceptin (traztuzumab).  To help prevent nausea and vomiting after your treatment, we encourage you to take your nausea medication as prescribed.   If you develop nausea and vomiting that is not controlled by your nausea medication, call the clinic.   BELOW ARE SYMPTOMS THAT SHOULD BE REPORTED IMMEDIATELY:  *FEVER GREATER THAN 100.5 F  *CHILLS WITH OR WITHOUT FEVER  NAUSEA AND VOMITING THAT IS NOT CONTROLLED WITH YOUR NAUSEA MEDICATION  *UNUSUAL SHORTNESS OF BREATH  *UNUSUAL BRUISING OR BLEEDING  TENDERNESS IN MOUTH AND THROAT WITH OR WITHOUT PRESENCE OF ULCERS  *URINARY PROBLEMS  *BOWEL PROBLEMS  UNUSUAL RASH Items with * indicate a potential emergency and should be followed up as soon as possible.  Feel free to call the clinic should you have any questions or concerns. The clinic phone number is [phone].  Please show the CHEMO ALERT CARD at check-in to the Emergency Department and triage nurse.   

## 2017-09-28 ENCOUNTER — Encounter: Payer: Self-pay | Admitting: Oncology

## 2017-09-28 NOTE — Progress Notes (Signed)
Patient called to inquire about assistance with Herceptin cost for new benefit year.  Advised patient she may enroll in [place] program to assist with the cost. She agreed to enroll.  Enrolled patient in [place] program for Herceptin with patient on phone via online. Patient approved for $25,000 effective 09/28/17 per 12 month eligibility period. Her copay will only be $5 per treatment after insurance pays. Advised her a copy of the letter will be mailed to her for her records and I will provide Lenise with this information to add the billing indicator to her account and monitor her billing and submit claims when needed. She verbalized understanding.

## 2017-10-03 DIAGNOSIS — H1012 Acute atopic conjunctivitis, left eye: Secondary | ICD-10-CM | POA: Diagnosis not present

## 2017-10-04 ENCOUNTER — Other Ambulatory Visit: Payer: Self-pay | Admitting: *Deleted

## 2017-10-04 DIAGNOSIS — Z170 Estrogen receptor positive status [ER+]: Principal | ICD-10-CM

## 2017-10-04 DIAGNOSIS — C50511 Malignant neoplasm of lower-outer quadrant of right female breast: Secondary | ICD-10-CM

## 2017-10-05 ENCOUNTER — Inpatient Hospital Stay: Payer: 59 | Attending: Adult Health

## 2017-10-05 ENCOUNTER — Inpatient Hospital Stay: Payer: 59

## 2017-10-05 VITALS — BP 118/85 | HR 68 | Temp 98.0°F | Resp 17 | Ht 62.0 in | Wt 113.5 lb

## 2017-10-05 DIAGNOSIS — Z170 Estrogen receptor positive status [ER+]: Principal | ICD-10-CM

## 2017-10-05 DIAGNOSIS — C50511 Malignant neoplasm of lower-outer quadrant of right female breast: Secondary | ICD-10-CM

## 2017-10-05 DIAGNOSIS — Z95828 Presence of other vascular implants and grafts: Secondary | ICD-10-CM

## 2017-10-05 DIAGNOSIS — Z5112 Encounter for antineoplastic immunotherapy: Secondary | ICD-10-CM | POA: Diagnosis not present

## 2017-10-05 LAB — CMP (CANCER CENTER ONLY)
ALT: 13 U/L (ref 0–44)
ANION GAP: 7 (ref 5–15)
AST: 16 U/L (ref 15–41)
Albumin: 3.8 g/dL (ref 3.5–5.0)
Alkaline Phosphatase: 49 U/L (ref 38–126)
BUN: 17 mg/dL (ref 8–23)
CALCIUM: 9.5 mg/dL (ref 8.9–10.3)
CO2: 26 mmol/L (ref 22–32)
Chloride: 103 mmol/L (ref 98–111)
Creatinine: 0.79 mg/dL (ref 0.44–1.00)
GFR, Estimated: >60 mL/min (ref >60)
Glucose, Bld: 88 mg/dL (ref 70–99)
Potassium: 4.3 mmol/L (ref 3.5–5.1)
Sodium: 136 mmol/L (ref 135–145)
TOTAL PROTEIN: 6.7 g/dL (ref 6.5–8.1)
Total Bilirubin: 0.6 mg/dL (ref 0.3–1.2)

## 2017-10-05 LAB — CBC WITH DIFFERENTIAL (CANCER CENTER ONLY)
BASOS ABS: 0 10*3/uL (ref 0.0–0.1)
BASOS PCT: 1 %
EOS ABS: 0.1 10*3/uL (ref 0.0–0.5)
EOS PCT: 2 %
HCT: 39.6 % (ref 34.8–46.6)
Hemoglobin: 13.6 g/dL (ref 11.6–15.9)
Lymphocytes Relative: 20 %
Lymphs Abs: 1.2 10*3/uL (ref 0.9–3.3)
MCH: 29.6 pg (ref 25.1–34.0)
MCHC: 34.3 g/dL (ref 31.5–36.0)
MCV: 86.1 fL (ref 79.5–101.0)
Monocytes Absolute: 0.4 10*3/uL (ref 0.1–0.9)
Monocytes Relative: 7 %
NEUTROS PCT: 70 %
Neutro Abs: 4.2 10*3/uL (ref 1.5–6.5)
PLATELETS: 218 10*3/uL (ref 145–400)
RBC: 4.6 MIL/uL (ref 3.70–5.45)
RDW: 13.3 % (ref 11.2–14.5)
WBC: 5.9 10*3/uL (ref 3.9–10.3)

## 2017-10-05 MED ORDER — HEPARIN SOD (PORK) LOCK FLUSH 100 UNIT/ML IV SOLN
500.0000 [IU] | Freq: Once | INTRAVENOUS | Status: AC | PRN
  Administered 2017-10-05: 500 [IU]
  Filled 2017-10-05: qty 5

## 2017-10-05 MED ORDER — ACETAMINOPHEN 325 MG PO TABS
ORAL_TABLET | ORAL | Status: AC
  Filled 2017-10-05: qty 2

## 2017-10-05 MED ORDER — SODIUM CHLORIDE 0.9% FLUSH
10.0000 mL | Freq: Once | INTRAVENOUS | Status: AC
  Administered 2017-10-05: 10 mL
  Filled 2017-10-05: qty 10

## 2017-10-05 MED ORDER — SODIUM CHLORIDE 0.9% FLUSH
10.0000 mL | INTRAVENOUS | Status: DC | PRN
  Administered 2017-10-05: 10 mL
  Filled 2017-10-05: qty 10

## 2017-10-05 MED ORDER — SODIUM CHLORIDE 0.9 % IV SOLN
Freq: Once | INTRAVENOUS | Status: AC
  Administered 2017-10-05: 11:00:00 via INTRAVENOUS
  Filled 2017-10-05: qty 250

## 2017-10-05 MED ORDER — ACETAMINOPHEN 325 MG PO TABS
650.0000 mg | ORAL_TABLET | Freq: Once | ORAL | Status: AC
  Administered 2017-10-05: 650 mg via ORAL

## 2017-10-05 MED ORDER — TRASTUZUMAB CHEMO 150 MG IV SOLR
300.0000 mg | Freq: Once | INTRAVENOUS | Status: AC
  Administered 2017-10-05: 300 mg via INTRAVENOUS
  Filled 2017-10-05: qty 14.29

## 2017-10-09 ENCOUNTER — Other Ambulatory Visit: Payer: 59

## 2017-10-09 ENCOUNTER — Ambulatory Visit: Payer: 59

## 2017-10-26 ENCOUNTER — Ambulatory Visit (HOSPITAL_BASED_OUTPATIENT_CLINIC_OR_DEPARTMENT_OTHER)
Admission: RE | Admit: 2017-10-26 | Discharge: 2017-10-26 | Disposition: A | Discharge status: Home / Self Care | Payer: 59 | Source: Ambulatory Visit | Attending: Cardiology | Admitting: Cardiology

## 2017-10-26 ENCOUNTER — Ambulatory Visit (HOSPITAL_COMMUNITY)
Admission: RE | Admit: 2017-10-26 | Discharge: 2017-10-26 | Disposition: A | Discharge status: Home / Self Care | Payer: 59 | Source: Ambulatory Visit | Attending: Cardiology | Admitting: Cardiology

## 2017-10-26 VITALS — BP 130/84 | HR 66 | Wt 115.0 lb

## 2017-10-26 DIAGNOSIS — Z8249 Family history of ischemic heart disease and other diseases of the circulatory system: Secondary | ICD-10-CM | POA: Diagnosis not present

## 2017-10-26 DIAGNOSIS — Z170 Estrogen receptor positive status [ER+]: Secondary | ICD-10-CM | POA: Diagnosis not present

## 2017-10-26 DIAGNOSIS — R251 Tremor, unspecified: Secondary | ICD-10-CM | POA: Insufficient documentation

## 2017-10-26 DIAGNOSIS — Z79810 Long term (current) use of selective estrogen receptor modulators (SERMs): Secondary | ICD-10-CM | POA: Insufficient documentation

## 2017-10-26 DIAGNOSIS — I10 Essential (primary) hypertension: Secondary | ICD-10-CM | POA: Diagnosis not present

## 2017-10-26 DIAGNOSIS — Z8261 Family history of arthritis: Secondary | ICD-10-CM | POA: Insufficient documentation

## 2017-10-26 DIAGNOSIS — Z8051 Family history of malignant neoplasm of kidney: Secondary | ICD-10-CM | POA: Insufficient documentation

## 2017-10-26 DIAGNOSIS — Z808 Family history of malignant neoplasm of other organs or systems: Secondary | ICD-10-CM | POA: Diagnosis not present

## 2017-10-26 DIAGNOSIS — Z820 Family history of epilepsy and other diseases of the nervous system: Secondary | ICD-10-CM | POA: Insufficient documentation

## 2017-10-26 DIAGNOSIS — Z9221 Personal history of antineoplastic chemotherapy: Secondary | ICD-10-CM | POA: Diagnosis not present

## 2017-10-26 DIAGNOSIS — Z8379 Family history of other diseases of the digestive system: Secondary | ICD-10-CM | POA: Insufficient documentation

## 2017-10-26 DIAGNOSIS — C50511 Malignant neoplasm of lower-outer quadrant of right female breast: Secondary | ICD-10-CM | POA: Insufficient documentation

## 2017-10-26 DIAGNOSIS — K589 Irritable bowel syndrome without diarrhea: Secondary | ICD-10-CM | POA: Insufficient documentation

## 2017-10-26 DIAGNOSIS — Z79899 Other long term (current) drug therapy: Secondary | ICD-10-CM | POA: Insufficient documentation

## 2017-10-26 NOTE — Patient Instructions (Signed)
Your physician recommends that you schedule a follow-up appointment in: 3 months with echocardiogram  

## 2017-10-26 NOTE — Progress Notes (Signed)
  Echocardiogram 2D Echocardiogram has been performed.  Rykker Coviello L Androw 10/26/2017, 11:46 AM

## 2017-10-28 NOTE — Progress Notes (Signed)
Oncology: Dr. Jana Hakim  62 yo with history of HTN and breast cancer was referred by Dr. Jana Hakim for cardio-oncology evaluation.    Right breast cancer was diagnosed 11/18, ER+/PR+/HER2+.  She had lumpectomy in 12/18.  She has completed paclitaxel x 12 cycles.  She will continue trastuzumab x 1 year.  She will have radiation.    BP controlled today on lisinopril, no further lightheadedness or syncope.  No exertional dyspnea or chest pain.  No history of cardiac problems.  Her mother died of CHF at 76. She has never smoked.   PMH: 1. Tremor 2. IBS 3. HTN 4. Breast cancer: Right breast cancer diagnosed 11/18, ER+/PR+/HER2+.  She had lumpectomy in 12/18.  She has completed paclitaxel x 12 cycles.  She will continue trastuzumab x 1 year.  She will have radiation.  - Echo (3/19): EF 60-65%, normal wall thickness.  GLS -24.9%.  - Echo (6/19): EF 60-65%, GLS -24%, no definite VSD, trivial MR, trivial AI, normal RV size and systolic function.  - Echo (9/19): EF 60-65%, GLS -21.3%  Social History   Socioeconomic History  . Marital status: Married    Spouse name: Not on file  . Number of children: Not on file  . Years of education: Not on file  . Highest education level: Not on file  Occupational History  . Not on file  Social Needs  . Financial resource strain: Not on file  . Food insecurity:    Worry: Not on file    Inability: Not on file  . Transportation needs:    Medical: Not on file    Non-medical: Not on file  Tobacco Use  . Smoking status: Never Smoker  . Smokeless tobacco: Never Used  Substance and Sexual Activity  . Alcohol use: Yes    Frequency: Never    Comment: occas  . Drug use: No  . Sexual activity: Not on file  Lifestyle  . Physical activity:    Days per week: Not on file    Minutes per session: Not on file  . Stress: Not on file  Relationships  . Social connections:    Talks on phone: Not on file    Gets together: Not on file    Attends religious service:  Not on file    Active member of club or organization: Not on file    Attends meetings of clubs or organizations: Not on file    Relationship status: Not on file  . Intimate partner violence:    Fear of current or ex partner: Not on file    Emotionally abused: Not on file    Physically abused: Not on file    Forced sexual activity: Not on file  Other Topics Concern  . Not on file  Social History Narrative  . Not on file   Family History  Problem Relation Age of Onset  . Rheum arthritis Mother   . Hyperlipidemia Mother   . Hypertension Mother   . Thyroid cancer Mother 60  . Diverticulitis Father   . Parkinson's disease Father   . Kidney cancer Father 66  . Multiple sclerosis Maternal Aunt   . Breast cancer Neg Hx    ROS: All systems reviewed and negative except as per HPI.   Current Outpatient Medications  Medication Sig Dispense Refill  . CALCIUM PO Take by mouth daily.    . cholecalciferol (VITAMIN D) 1000 units tablet Take 2,000 Units by mouth daily.    Marland Kitchen FLUoxetine (PROZAC) 10 MG  capsule Take 20 mg by mouth daily.     Marland Kitchen lisinopril (PRINIVIL,ZESTRIL) 10 MG tablet Take 1 tablet (10 mg total) by mouth daily. 30 tablet 3  . Probiotic Product (PROBIOTIC-10 PO) Take by mouth.    . Red Yeast Rice 600 MG CAPS Take by mouth.    . tamoxifen (NOLVADEX) 20 MG tablet Take 1 tablet (20 mg total) by mouth daily. 30 tablet 3   No current facility-administered medications for this encounter.    BP 130/84   Pulse 66   Wt 52.2 kg (115 lb)   SpO2 98%   BMI 21.03 kg/m  General: NAD Neck: No JVD, no thyromegaly or thyroid nodule.  Lungs: Clear to auscultation bilaterally with normal respiratory effort. CV: Nondisplaced PMI.  Heart regular S1/S2, no S3/S4, 1/6 SEM RUSB.  No peripheral edema.  No carotid bruit.  Normal pedal pulses.  Abdomen: Soft, nontender, no hepatosplenomegaly, no distention.  Skin: Intact without lesions or rashes.  Neurologic: Alert and oriented x 3.  Psych:  Normal affect. Extremities: No clubbing or cyanosis.  HEENT: Normal.   Assessment/Plan: 1. HTN: BP appears controlled on current dose of lisinopril. 2. Breast cancer: She will be on Herceptin to 12/19.  Echo today was reviewed, normal EF and strain pattern.   - She can continue Herceptin as scheduled and we will repeat an echo in 3 months in 12/19, this will likely be her last screening echo.   3. Abnormal echo: Echo from 12/18 describes moderate focal basal septal hypertrophy with mitral valve SAM, which suggests hypertrophic cardiomyopathy, and a supracristal VSD was reported. Echoes on our office do not show LVH or mitral valve SAM, and no VSD is visualized. She also has a very minimal murmur on exam which does not suggest a VSD or HCM with LVOT obstruction.  I do not think that she has significant abnormalities but will continue to follow her echoes closely while she is on Herceptin.   Loralie Champagne 10/28/2017

## 2017-10-29 ENCOUNTER — Other Ambulatory Visit: Payer: Self-pay | Admitting: *Deleted

## 2017-10-29 DIAGNOSIS — Z170 Estrogen receptor positive status [ER+]: Principal | ICD-10-CM

## 2017-10-29 DIAGNOSIS — C50511 Malignant neoplasm of lower-outer quadrant of right female breast: Secondary | ICD-10-CM

## 2017-10-29 NOTE — Progress Notes (Signed)
[place]  [phone] [phone]     ID: [name] DOB: 1955-10-27  MR#: [id number]  TIR#:[id number]  Patient Care Team: Jonathon Jordan, MD as PCP - General (Family Medicine) Fanny Skates, MD as Consulting Physician (General Surgery) Magrinat, Virgie Dad, MD as Consulting Physician (Oncology) Richmond Campbell, MD as Consulting Physician (Gastroenterology) Eppie Gibson, MD as Attending Physician (Radiation Oncology) Lovey Newcomer, MD as Attending Physician (Radiology) Bensimhon, Shaune Pascal, MD as Consulting Physician (Cardiology) Molli Posey, MD as Consulting Physician (Obstetrics and Gynecology) OTHER MD:  CHIEF COMPLAINT: Triple positive breast cancer  CURRENT TREATMENT: continuing trastuzumab, tamoxifen  HISTORY OF CURRENT ILLNESS: From the original intake note:  Katie Marquez had bilateral screening mammography with tomography at the breast center 12/12/2016, showing a possible mass in the right breast.  Right breast diagnostic mammography with tomography and right breast ultrasonography 12/15/2016 found the breast density to be category D.  In the lower right breast there was a small lobulated mass which was not palpable.  Ultrasound showed a 0.8 cm mass in the right breast 7:00 radiant 2 cm from the nipple.  The right axilla was benign.  Biopsy of the right breast mass in question 12/15/2016 showed ([id number]) and invasive ductal carcinoma, grade 3, estrogen receptor at 95% positive, progesterone receptor 50% positive, both with strong staining intensity, with an MIB-1 of 15%, and HER-2 amplification, the signals ratio being 1.83 but the number per cell 10.23.  The patient's subsequent history is as detailed below.  INTERVAL HISTORY: Katie Marquez returns today for follow-up and treatment of her triple positive breast cancer . She completed radiation treatments on 06/06/2017. She tolerated this well.  She continues on trastuzumab given every 21  days, with a dose due today. She also tolerates this well. She denies issues with fatigue, nausea, or diarrhea. Her most recent echocardiogram on 10/26/2017 showed an ejections fraction in the 60-65% range.   She also continues on tamoxifen, with good tolerance. She has regular hot flashes that have not increased since starting tamoxifen. She denies issues with increase vaginal discharge.    REVIEW OF SYSTEMS: Katie Marquez reports that her daughter got married two weeks ago at the [facility]. She notes that the food was excellent. Katie Marquez son-in-law works in Engineer, mining in [place]. She does not like that her hair is too curly and she is wondering if she can perm it. For exercise, she enjoys swimming and walking. She denies unusual headaches, visual changes, nausea, vomiting, or dizziness. There has been no unusual cough, phlegm production, or pleurisy. There has been no change in bowel or bladder habits. She denies unexplained fatigue or unexplained weight loss, bleeding, rash, or fever. A detailed review of systems was otherwise stable.     PAST MEDICAL HISTORY: Past Medical History:  Diagnosis Date  . Anxiety   . Cancer [facility])    breast cancer  . Diverticulosis   . Family history of kidney cancer   . Family history of thyroid cancer   . Heart murmur   . History of radiation therapy 05/09/17- 06/06/17   40.05 Gy directed to the right breast in 15 fractions followed by boost of 10 Gy in 5 fractions.   . Hypertension   . IBS (irritable bowel syndrome)   . Iron deficiency   . Osteoarthritis   . PONV (postoperative nausea and vomiting)   . Tremors of nervous system    essential tremors    PAST SURGICAL HISTORY: Past Surgical History:  Procedure Laterality Date  .  BREAST LUMPECTOMY WITH RADIOACTIVE SEED AND SENTINEL LYMPH NODE BIOPSY Right 01/08/2017   Procedure: RIGHT BREAST LUMPECTOMY WITH RADIOACTIVE SEED AND RIGHT SENTINEL LYMPH NODE BIOPSY ERAS PATHWAY;  Surgeon: Fanny Skates, MD;  Location:  [facility];  Service: General;  Laterality: Right;  PEC BLOCK  . LAPAROSCOPIC SIGMOID COLECTOMY    . LAPAROSCOPY    . PORTACATH PLACEMENT Left 01/08/2017   Procedure: INSERTION PORT-A-CATH WITH ULTRA SOUND;  Surgeon: Fanny Skates, MD;  Location: [facility] OR;  Service: General;  Laterality: Left;    FAMILY HISTORY Family History  Problem Relation Age of Onset  . Rheum arthritis Mother   . Hyperlipidemia Mother   . Hypertension Mother   . Thyroid cancer Mother 51  . Diverticulitis Father   . Parkinson's disease Father   . Kidney cancer Father 20  . Multiple sclerosis Maternal Aunt   . Breast cancer Neg Hx   Katie Marquez comes from an [place] background.  Her father died at 53 with Parkinson's disease and complications from tobacco abuse including a history of renal cancer; the patient's mother died at age 40, with a history of rheumatoid arthritis and thyroid cancer.  The patient had no brothers, 1 sister, in good health.  There is no history of breast or ovarian cancer in the family  GYNECOLOGIC HISTORY:  No LMP recorded. Patient is postmenopausal. Menarche age 80, first live birth age 75.  The patient is GX P1.  She had infertility treatments after her first live birth but they were not successful.  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  Their daughter Katie Marquez lives in [place] and previously attended [employer] and then the [employer] in business and psychology. The patient's daughter got married in September 2019, and her husband is in Engineer, mining.     ADVANCED DIRECTIVES:    HEALTH MAINTENANCE: Social History   Tobacco Use  . Smoking status: Never Smoker  . Smokeless tobacco: Never Used  Substance Use Topics  . Alcohol use: Yes    Frequency: Never    Comment: occas  . Drug  use: No     Colonoscopy: [facility]  PAP: [place]  Bone density: Osteopenia/ [name] around 2017   No Known Allergies  Current Outpatient Medications  Medication Sig Dispense Refill  . CALCIUM PO Take by mouth daily.    . cholecalciferol (VITAMIN D) 1000 units tablet Take 2,000 Units by mouth daily.    Marland Kitchen FLUoxetine (PROZAC) 10 MG capsule Take 20 mg by mouth daily.     Marland Kitchen lisinopril (PRINIVIL,ZESTRIL) 10 MG tablet Take 1 tablet (10 mg total) by mouth daily. 30 tablet 3  . Probiotic Product (PROBIOTIC-10 PO) Take by mouth.    . Red Yeast Rice 600 MG CAPS Take by mouth.    . tamoxifen (NOLVADEX) 20 MG tablet Take 1 tablet (20 mg total) by mouth daily. 30 tablet 3   No current facility-administered medications for this visit.     OBJECTIVE: Middle-aged white woman in no acute distress  Vitals:   10/30/17 1015  BP: 131/85  Pulse: 72  Resp: 18  Temp: 98.9 F (37.2 C)  SpO2: 97%     Body mass index is 21.13 kg/m.   Wt Readings from Last 3 Encounters:  10/30/17 115 lb 8 oz (52.4 kg)  10/26/17 115  lb (52.2 kg)  10/05/17 113 lb 8 oz (51.5 kg)      ECOG FS:0 - Asymptomatic   Sclerae unicteric, EOMs intact Oropharynx clear and moist No cervical or supraclavicular adenopathy Lungs no rales or rhonchi Heart regular rate and rhythm Abd soft, nontender, positive bowel sounds MSK no focal spinal tenderness, no upper extremity lymphedema Neuro: nonfocal, well oriented, appropriate affect Breasts: The right breast is status post lumpectomy and radiation.  There is no evidence of local recurrence.  The left breast is benign.  Both axillae are benign.  LAB RESULTS:  CMP     Component Value Date/Time   NA 137 10/30/2017 0949   NA 141 01/31/2017 1225   K 4.4 10/30/2017 0949   K 4.1 01/31/2017 1225   CL 102 10/30/2017 0949   CO2 29 10/30/2017 0949   CO2 29 01/31/2017 1225   GLUCOSE 98 10/30/2017 0949   GLUCOSE 86 01/31/2017 1225   BUN 16 10/30/2017 0949   BUN 17.4  01/31/2017 1225   CREATININE 0.80 10/30/2017 0949   CREATININE 0.8 01/31/2017 1225   CALCIUM 9.6 10/30/2017 0949   CALCIUM 10.0 01/31/2017 1225   PROT 6.7 10/30/2017 0949   PROT 7.2 01/31/2017 1225   ALBUMIN 3.7 10/30/2017 0949   ALBUMIN 4.0 01/31/2017 1225   AST 16 10/30/2017 0949   AST 18 01/31/2017 1225   ALT 14 10/30/2017 0949   ALT 29 01/31/2017 1225   ALKPHOS 46 10/30/2017 0949   ALKPHOS 66 01/31/2017 1225   BILITOT 0.4 10/30/2017 0949   BILITOT 0.57 01/31/2017 1225   GFRNONAA >60 10/30/2017 0949   GFRAA >60 10/30/2017 0949    No results found for: Ronnald Ramp, A1GS, A2GS, BETS, BETA2SER, GAMS, MSPIKE, SPEI  No results found for: Nils Pyle, [facility]  Lab Results  Component Value Date   WBC 6.3 10/30/2017   NEUTROABS 4.3 10/30/2017   HGB 14.1 10/30/2017   HCT 41.1 10/30/2017   MCV 86.8 10/30/2017   PLT 245 10/30/2017      Chemistry      Component Value Date/Time   NA 137 10/30/2017 0949   NA 141 01/31/2017 1225   K 4.4 10/30/2017 0949   K 4.1 01/31/2017 1225   CL 102 10/30/2017 0949   CO2 29 10/30/2017 0949   CO2 29 01/31/2017 1225   BUN 16 10/30/2017 0949   BUN 17.4 01/31/2017 1225   CREATININE 0.80 10/30/2017 0949   CREATININE 0.8 01/31/2017 1225      Component Value Date/Time   CALCIUM 9.6 10/30/2017 0949   CALCIUM 10.0 01/31/2017 1225   ALKPHOS 46 10/30/2017 0949   ALKPHOS 66 01/31/2017 1225   AST 16 10/30/2017 0949   AST 18 01/31/2017 1225   ALT 14 10/30/2017 0949   ALT 29 01/31/2017 1225   BILITOT 0.4 10/30/2017 0949   BILITOT 0.57 01/31/2017 1225       No results found for: LABCA2  No components found for: [id number]  No results for input(s): INR in the last 168 hours.  No results found for: LABCA2  No results found for: ZRA076  No results found for: AUQ333  No results found for: LKT625  No results found for: CA2729  No components found for: HGQUANT  No results found for: CEA1 / No results  found for: CEA1   No results found for: AFPTUMOR  No results found for: [place]  No results found for: PSA1  Appointment on 10/30/2017  Component Date Value Ref Range Status  .  Sodium 10/30/2017 137  135 - 145 mmol/L Final  . Potassium 10/30/2017 4.4  3.5 - 5.1 mmol/L Final  . Chloride 10/30/2017 102  98 - 111 mmol/L Final  . CO2 10/30/2017 29  22 - 32 mmol/L Final  . Glucose, Bld 10/30/2017 98  70 - 99 mg/dL Final  . BUN 10/30/2017 16  8 - 23 mg/dL Final  . Creatinine 10/30/2017 0.80  0.44 - 1.00 mg/dL Final  . Calcium 10/30/2017 9.6  8.9 - 10.3 mg/dL Final  . Total Protein 10/30/2017 6.7  6.5 - 8.1 g/dL Final  . Albumin 10/30/2017 3.7  3.5 - 5.0 g/dL Final  . AST 10/30/2017 16  15 - 41 U/L Final  . ALT 10/30/2017 14  0 - 44 U/L Final  . Alkaline Phosphatase 10/30/2017 46  38 - 126 U/L Final  . Total Bilirubin 10/30/2017 0.4  0.3 - 1.2 mg/dL Final  . GFR, Est Non Af Am 10/30/2017 >60  >60 mL/min Final  . GFR, Est AFR Am 10/30/2017 >60  >60 mL/min Final   Comment: (NOTE) The eGFR has been calculated using the CKD EPI equation. This calculation has not been validated in all clinical situations. eGFR's persistently <60 mL/min signify possible Chronic Kidney Disease.   Georgiann Hahn gap 10/30/2017 6  5 - 15 Final   Performed at [facility], [place] [address], [place], [place] [address]  . WBC Count 10/30/2017 6.3  3.9 - 10.3 K/uL Final  . RBC 10/30/2017 4.73  3.70 - 5.45 MIL/uL Final  . Hemoglobin 10/30/2017 14.1  11.6 - 15.9 g/dL Final  . HCT 10/30/2017 41.1  34.8 - 46.6 % Final  . MCV 10/30/2017 86.8  79.5 - 101.0 fL Final  . MCH 10/30/2017 29.7  25.1 - 34.0 pg Final  . MCHC 10/30/2017 34.3  31.5 - 36.0 g/dL Final  . RDW 10/30/2017 13.4  11.2 - 14.5 % Final  . Platelet Count 10/30/2017 245  145 - 400 K/uL Final  . Neutrophils Relative % 10/30/2017 68  % Final  . Neutro Abs 10/30/2017 4.3  1.5 - 6.5 K/uL Final  . Lymphocytes Relative 10/30/2017 20  %  Final  . Lymphs Abs 10/30/2017 1.3  0.9 - 3.3 K/uL Final  . Monocytes Relative 10/30/2017 8  % Final  . Monocytes Absolute 10/30/2017 0.5  0.1 - 0.9 K/uL Final  . Eosinophils Relative 10/30/2017 3  % Final  . Eosinophils Absolute 10/30/2017 0.2  0.0 - 0.5 K/uL Final  . Basophils Relative 10/30/2017 1  % Final  . Basophils Absolute 10/30/2017 0.1  0.0 - 0.1 K/uL Final   Performed at [facility], [place] [address] [place], [place] [address]    (this displays the last labs from the last 3 days)  No results found for: TOTALPROTELP, ALBUMINELP, A1GS, A2GS, BETS, BETA2SER, GAMS, MSPIKE, SPEI (this displays SPEP labs)  No results found for: KPAFRELGTCHN, LAMBDASER, KAPLAMBRATIO (kappa/lambda light chains)  No results found for: HGBA, HGBA2QUANT, HGBFQUANT, HGBSQUAN (Hemoglobinopathy evaluation)   No results found for: LDH  No results found for: IRON, TIBC, IRONPCTSAT (Iron and TIBC)  No results found for: FERRITIN  Urinalysis No results found for: COLORURINE, APPEARANCEUR, LABSPEC, PHURINE, GLUCOSEU, HGBUR, BILIRUBINUR, KETONESUR, PROTEINUR, UROBILINOGEN, NITRITE, LEUKOCYTESUR   STUDIES: Echo results reviewed  ELIGIBLE FOR AVAILABLE RESEARCH PROTOCOL: no  ASSESSMENT: 62 y.o. [place] woman status post right breast lower outer quadrant biopsy 12/15/2016 for a clinical T1b N0, stage IA invasive ductal carcinoma, grade  3, triple positive, with an MIB-1 of 15%  (1) status post right lumpectomy 01/08/2017 for a  pT1c pN0, stage IA invasive ductal carcinoma, grade 2, with negative margins.    (2) adjuvant chemotherapy consisting of paclitaxel weekly x12, together with trastuzumab every 21 days started 01/31/2017  (3) trastuzumab to be continued to total 1 year (through December 2019)  (a) echocardiogram 01/17/2017 demonstrates a LVEF of 65-70%  (b) echocardiogram 04/19/2017 showed an ejection fraction in the 60-65% range  (c) echocardiogram 07/20/2017 showed  an ejection fraction in the 55-60% range  (d) echocardiogram 10/26/2017 showed an ejection fraction in the 60-65% range  (4) adjuvant radiation completed 06/06/2017 Site/dose:   40.05 Gy directed to the right breast in 15 fractions followed by boost of 10 Gy in 5 fractions  (5) started tamoxifen 06/30/2017  (6) genetics testing completed on 02/20/2017 offered through [facility] showed: no deleterious mutations. The following genes were evaluated for sequence changes and exonic deletions/duplications: APC, ATM, AXIN2, BAP1, BARD1, BMPR1A, BRCA1, BRCA2, BRIP1, BUB1B, CDC73, CDH1, CDK4, CDKN1C, CDKN2A (p14ARF), CDKN2A (p16INK4a), CEP57, CHEK2, CTNNA1, DICER1, DIS3L2, EPCAM*, FH, FLCN, GPC3, GREM1*, KIT, MEN1, MET, MLH1, MSH2, MSH3, MSH6, MUTYH, NBN, NF1, PALB2, PDGFRA, PMS2, POLD1, POLE, PTEN, RAD50, RAD51C, RAD51D, SDHB,SDHC, SDHD, SMAD4, SMARCA4, SMARCB1, STK11, TP53, TSC1, TSC2, VHL, WT1. The following genes were evaluated for sequence changes only: HOXB13*, MITF*, NTHL1*, SDHA. Results are negative unless otherwise indicated  PLAN: Katie Marquez is tolerating tamoxifen remarkably well and the plan will be to continue that a minimum of 5 years.  She is also doing very well on the trastuzumab.  We reviewed her echocardiogram which continues to show an excellent ejection fraction.  She will need to have that repeated in December.  For travel reasons we have made a couple of minor changes.  Specifically her final treatment, 1224, has been moved to 1217 and accordingly I have dropped the trastuzumab dose of the treatment before that to 4 mg/kg.  I will see her again on 01/15/2018 and set her up for port removal in January as well as long-term follow-up  She will have a repeat mammography with tomography in November  She knows to call for any other issues that may develop before the next visit.    Magrinat, Virgie Dad, MD  10/30/17 10:52 AM Medical Oncology and Hematology [facility] [address] [place], [place] [address] Tel. [phone]    Fax. [phone]  Alice Rieger, am acting as scribe for Chauncey Cruel MD.  I, Lurline Del MD, have reviewed the above documentation for accuracy and completeness, and I agree with the above.

## 2017-10-30 ENCOUNTER — Inpatient Hospital Stay: Payer: 59

## 2017-10-30 ENCOUNTER — Inpatient Hospital Stay: Payer: 59 | Attending: Adult Health | Admitting: Oncology

## 2017-10-30 ENCOUNTER — Telehealth: Payer: Self-pay | Admitting: Oncology

## 2017-10-30 VITALS — BP 131/85 | HR 72 | Temp 98.9°F | Resp 18 | Ht 62.0 in | Wt 115.5 lb

## 2017-10-30 DIAGNOSIS — R011 Cardiac murmur, unspecified: Secondary | ICD-10-CM | POA: Diagnosis not present

## 2017-10-30 DIAGNOSIS — C50511 Malignant neoplasm of lower-outer quadrant of right female breast: Secondary | ICD-10-CM

## 2017-10-30 DIAGNOSIS — R251 Tremor, unspecified: Secondary | ICD-10-CM | POA: Diagnosis not present

## 2017-10-30 DIAGNOSIS — Z5112 Encounter for antineoplastic immunotherapy: Secondary | ICD-10-CM

## 2017-10-30 DIAGNOSIS — Z170 Estrogen receptor positive status [ER+]: Principal | ICD-10-CM

## 2017-10-30 DIAGNOSIS — Z923 Personal history of irradiation: Secondary | ICD-10-CM | POA: Insufficient documentation

## 2017-10-30 DIAGNOSIS — Z79810 Long term (current) use of selective estrogen receptor modulators (SERMs): Secondary | ICD-10-CM | POA: Diagnosis not present

## 2017-10-30 DIAGNOSIS — R232 Flushing: Secondary | ICD-10-CM | POA: Insufficient documentation

## 2017-10-30 DIAGNOSIS — F419 Anxiety disorder, unspecified: Secondary | ICD-10-CM | POA: Diagnosis not present

## 2017-10-30 DIAGNOSIS — M1990 Unspecified osteoarthritis, unspecified site: Secondary | ICD-10-CM | POA: Diagnosis not present

## 2017-10-30 DIAGNOSIS — Z79899 Other long term (current) drug therapy: Secondary | ICD-10-CM

## 2017-10-30 DIAGNOSIS — I10 Essential (primary) hypertension: Secondary | ICD-10-CM | POA: Diagnosis not present

## 2017-10-30 DIAGNOSIS — Z8052 Family history of malignant neoplasm of bladder: Secondary | ICD-10-CM | POA: Diagnosis not present

## 2017-10-30 DIAGNOSIS — K589 Irritable bowel syndrome without diarrhea: Secondary | ICD-10-CM | POA: Insufficient documentation

## 2017-10-30 LAB — CMP (CANCER CENTER ONLY)
ALT: 14 U/L (ref 0–44)
AST: 16 U/L (ref 15–41)
Albumin: 3.7 g/dL (ref 3.5–5.0)
Alkaline Phosphatase: 46 U/L (ref 38–126)
Anion gap: 6 (ref 5–15)
BUN: 16 mg/dL (ref 8–23)
CHLORIDE: 102 mmol/L (ref 98–111)
CO2: 29 mmol/L (ref 22–32)
CREATININE: 0.8 mg/dL (ref 0.44–1.00)
Calcium: 9.6 mg/dL (ref 8.9–10.3)
GFR, Est AFR Am: >60 mL/min (ref >60)
Glucose, Bld: 98 mg/dL (ref 70–99)
Potassium: 4.4 mmol/L (ref 3.5–5.1)
SODIUM: 137 mmol/L (ref 135–145)
TOTAL PROTEIN: 6.7 g/dL (ref 6.5–8.1)
Total Bilirubin: 0.4 mg/dL (ref 0.3–1.2)

## 2017-10-30 LAB — CBC WITH DIFFERENTIAL (CANCER CENTER ONLY)
BASOS ABS: 0.1 10*3/uL (ref 0.0–0.1)
Basophils Relative: 1 %
EOS PCT: 3 %
Eosinophils Absolute: 0.2 10*3/uL (ref 0.0–0.5)
HCT: 41.1 % (ref 34.8–46.6)
Hemoglobin: 14.1 g/dL (ref 11.6–15.9)
LYMPHS ABS: 1.3 10*3/uL (ref 0.9–3.3)
Lymphocytes Relative: 20 %
MCH: 29.7 pg (ref 25.1–34.0)
MCHC: 34.3 g/dL (ref 31.5–36.0)
MCV: 86.8 fL (ref 79.5–101.0)
Monocytes Absolute: 0.5 10*3/uL (ref 0.1–0.9)
Monocytes Relative: 8 %
Neutro Abs: 4.3 10*3/uL (ref 1.5–6.5)
Neutrophils Relative %: 68 %
PLATELETS: 245 10*3/uL (ref 145–400)
RBC: 4.73 MIL/uL (ref 3.70–5.45)
RDW: 13.4 % (ref 11.2–14.5)
WBC: 6.3 10*3/uL (ref 3.9–10.3)

## 2017-10-30 MED ORDER — ACETAMINOPHEN 325 MG PO TABS
650.0000 mg | ORAL_TABLET | Freq: Once | ORAL | Status: AC
  Administered 2017-10-30: 650 mg via ORAL

## 2017-10-30 MED ORDER — SODIUM CHLORIDE 0.9 % IV SOLN
Freq: Once | INTRAVENOUS | Status: AC
  Administered 2017-10-30: 11:00:00 via INTRAVENOUS
  Filled 2017-10-30: qty 250

## 2017-10-30 MED ORDER — ACETAMINOPHEN 325 MG PO TABS
ORAL_TABLET | ORAL | Status: AC
  Filled 2017-10-30: qty 2

## 2017-10-30 MED ORDER — SODIUM CHLORIDE 0.9% FLUSH
10.0000 mL | INTRAVENOUS | Status: DC | PRN
  Administered 2017-10-30: 10 mL
  Filled 2017-10-30: qty 10

## 2017-10-30 MED ORDER — TRASTUZUMAB CHEMO 150 MG IV SOLR
300.0000 mg | Freq: Once | INTRAVENOUS | Status: AC
  Administered 2017-10-30: 300 mg via INTRAVENOUS
  Filled 2017-10-30: qty 14.29

## 2017-10-30 MED ORDER — HEPARIN SOD (PORK) LOCK FLUSH 100 UNIT/ML IV SOLN
500.0000 [IU] | Freq: Once | INTRAVENOUS | Status: AC | PRN
  Administered 2017-10-30: 500 [IU]
  Filled 2017-10-30: qty 5

## 2017-10-30 NOTE — Telephone Encounter (Signed)
Gave pt avs and calendar  °

## 2017-10-30 NOTE — Patient Instructions (Signed)
Cancer Center Discharge Instructions for Patients Receiving Chemotherapy  Today you received the following chemotherapy agents: Herceptin (traztuzumab).  To help prevent nausea and vomiting after your treatment, we encourage you to take your nausea medication as prescribed.   If you develop nausea and vomiting that is not controlled by your nausea medication, call the clinic.   BELOW ARE SYMPTOMS THAT SHOULD BE REPORTED IMMEDIATELY:  *FEVER GREATER THAN 100.5 F  *CHILLS WITH OR WITHOUT FEVER  NAUSEA AND VOMITING THAT IS NOT CONTROLLED WITH YOUR NAUSEA MEDICATION  *UNUSUAL SHORTNESS OF BREATH  *UNUSUAL BRUISING OR BLEEDING  TENDERNESS IN MOUTH AND THROAT WITH OR WITHOUT PRESENCE OF ULCERS  *URINARY PROBLEMS  *BOWEL PROBLEMS  UNUSUAL RASH Items with * indicate a potential emergency and should be followed up as soon as possible.  Feel free to call the clinic should you have any questions or concerns. The clinic phone number is [phone].  Please show the CHEMO ALERT CARD at check-in to the Emergency Department and triage nurse.   

## 2017-10-31 ENCOUNTER — Other Ambulatory Visit: Payer: Self-pay | Admitting: Oncology

## 2017-11-05 ENCOUNTER — Other Ambulatory Visit: Payer: Self-pay | Admitting: *Deleted

## 2017-11-06 ENCOUNTER — Other Ambulatory Visit: Payer: Self-pay | Admitting: *Deleted

## 2017-11-06 MED ORDER — TAMOXIFEN CITRATE 20 MG PO TABS: 20.0000 mg | ORAL_TABLET | Freq: Every day | ORAL | 3 refills | Status: DC

## 2017-11-08 DIAGNOSIS — I10 Essential (primary) hypertension: Secondary | ICD-10-CM | POA: Diagnosis not present

## 2017-11-08 DIAGNOSIS — Z23 Encounter for immunization: Secondary | ICD-10-CM | POA: Diagnosis not present

## 2017-11-08 DIAGNOSIS — Z0000 Encounter for general adult medical examination without abnormal findings: Secondary | ICD-10-CM | POA: Diagnosis not present

## 2017-11-16 ENCOUNTER — Other Ambulatory Visit: Payer: Self-pay

## 2017-11-16 DIAGNOSIS — C50511 Malignant neoplasm of lower-outer quadrant of right female breast: Secondary | ICD-10-CM

## 2017-11-16 DIAGNOSIS — Z170 Estrogen receptor positive status [ER+]: Principal | ICD-10-CM

## 2017-11-19 ENCOUNTER — Inpatient Hospital Stay: Payer: 59

## 2017-11-19 VITALS — BP 131/86 | HR 64 | Temp 98.5°F | Resp 18

## 2017-11-19 DIAGNOSIS — Z170 Estrogen receptor positive status [ER+]: Principal | ICD-10-CM

## 2017-11-19 DIAGNOSIS — C50511 Malignant neoplasm of lower-outer quadrant of right female breast: Secondary | ICD-10-CM

## 2017-11-19 DIAGNOSIS — Z95828 Presence of other vascular implants and grafts: Secondary | ICD-10-CM

## 2017-11-19 LAB — CBC WITH DIFFERENTIAL (CANCER CENTER ONLY)
Abs Immature Granulocytes: 0.02 10*3/uL (ref 0.00–0.07)
Basophils Absolute: 0 10*3/uL (ref 0.0–0.1)
Basophils Relative: 1 %
EOS ABS: 0.2 10*3/uL (ref 0.0–0.5)
EOS PCT: 3 %
HCT: 39.1 % (ref 36.0–46.0)
Hemoglobin: 13.3 g/dL (ref 12.0–15.0)
Immature Granulocytes: 0 %
Lymphocytes Relative: 20 %
Lymphs Abs: 1.4 10*3/uL (ref 0.7–4.0)
MCH: 29.2 pg (ref 26.0–34.0)
MCHC: 34 g/dL (ref 30.0–36.0)
MCV: 85.9 fL (ref 80.0–100.0)
MONO ABS: 0.6 10*3/uL (ref 0.1–1.0)
Monocytes Relative: 9 %
Neutro Abs: 4.5 10*3/uL (ref 1.7–7.7)
Neutrophils Relative %: 67 %
PLATELETS: 229 10*3/uL (ref 150–400)
RBC: 4.55 MIL/uL (ref 3.87–5.11)
RDW: 12.4 % (ref 11.5–15.5)
WBC: 6.7 10*3/uL (ref 4.0–10.5)
nRBC: 0 % (ref 0.0–0.2)

## 2017-11-19 LAB — CMP (CANCER CENTER ONLY)
ALT: 12 U/L (ref 0–44)
AST: 15 U/L (ref 15–41)
Albumin: 3.5 g/dL (ref 3.5–5.0)
Alkaline Phosphatase: 42 U/L (ref 38–126)
Anion gap: 8 (ref 5–15)
BILIRUBIN TOTAL: 0.4 mg/dL (ref 0.3–1.2)
BUN: 22 mg/dL (ref 8–23)
CALCIUM: 9.4 mg/dL (ref 8.9–10.3)
CO2: 27 mmol/L (ref 22–32)
Chloride: 103 mmol/L (ref 98–111)
Creatinine: 0.85 mg/dL (ref 0.44–1.00)
GFR, Est AFR Am: >60 mL/min (ref >60)
Glucose, Bld: 102 mg/dL — ABNORMAL HIGH (ref 70–99)
POTASSIUM: 4.4 mmol/L (ref 3.5–5.1)
Sodium: 138 mmol/L (ref 135–145)
TOTAL PROTEIN: 6.4 g/dL — AB (ref 6.5–8.1)

## 2017-11-19 MED ORDER — SODIUM CHLORIDE 0.9 % IV SOLN
Freq: Once | INTRAVENOUS | Status: AC
  Administered 2017-11-19: 09:00:00 via INTRAVENOUS
  Filled 2017-11-19: qty 250

## 2017-11-19 MED ORDER — SODIUM CHLORIDE 0.9% FLUSH
10.0000 mL | INTRAVENOUS | Status: DC | PRN
  Administered 2017-11-19: 10 mL
  Filled 2017-11-19: qty 10

## 2017-11-19 MED ORDER — SODIUM CHLORIDE 0.9% FLUSH
10.0000 mL | Freq: Once | INTRAVENOUS | Status: AC
  Administered 2017-11-19: 10 mL
  Filled 2017-11-19: qty 10

## 2017-11-19 MED ORDER — TRASTUZUMAB CHEMO 150 MG IV SOLR
300.0000 mg | Freq: Once | INTRAVENOUS | Status: AC
  Administered 2017-11-19: 300 mg via INTRAVENOUS
  Filled 2017-11-19: qty 14.29

## 2017-11-19 MED ORDER — ACETAMINOPHEN 325 MG PO TABS
650.0000 mg | ORAL_TABLET | Freq: Once | ORAL | Status: AC
  Administered 2017-11-19: 650 mg via ORAL

## 2017-11-19 MED ORDER — ACETAMINOPHEN 325 MG PO TABS
ORAL_TABLET | ORAL | Status: AC
  Filled 2017-11-19: qty 2

## 2017-11-19 MED ORDER — HEPARIN SOD (PORK) LOCK FLUSH 100 UNIT/ML IV SOLN
500.0000 [IU] | Freq: Once | INTRAVENOUS | Status: AC | PRN
  Administered 2017-11-19: 500 [IU]
  Filled 2017-11-19: qty 5

## 2017-11-19 NOTE — Patient Instructions (Signed)
[facility] Discharge Instructions for Patients Receiving Chemotherapy  Today you received the following chemotherapy agents: Herceptin (traztuzumab).  To help prevent nausea and vomiting after your treatment, we encourage you to take your nausea medication as prescribed.   If you develop nausea and vomiting that is not controlled by your nausea medication, call the clinic.   BELOW ARE SYMPTOMS THAT SHOULD BE REPORTED IMMEDIATELY:  *FEVER GREATER THAN 100.5 F  *CHILLS WITH OR WITHOUT FEVER  NAUSEA AND VOMITING THAT IS NOT CONTROLLED WITH YOUR NAUSEA MEDICATION  *UNUSUAL SHORTNESS OF BREATH  *UNUSUAL BRUISING OR BLEEDING  TENDERNESS IN MOUTH AND THROAT WITH OR WITHOUT PRESENCE OF ULCERS  *URINARY PROBLEMS  *BOWEL PROBLEMS  UNUSUAL RASH Items with * indicate a potential emergency and should be followed up as soon as possible.  Feel free to call the clinic should you have any questions or concerns. The clinic phone number is [phone].  Please show the CHEMO ALERT CARD at check-in to the Emergency Department and triage nurse.   

## 2017-11-20 ENCOUNTER — Ambulatory Visit: Payer: 59

## 2017-11-20 ENCOUNTER — Other Ambulatory Visit: Payer: 59

## 2017-12-06 ENCOUNTER — Other Ambulatory Visit: Payer: Self-pay | Admitting: Oncology

## 2017-12-10 ENCOUNTER — Other Ambulatory Visit: Payer: Self-pay

## 2017-12-10 DIAGNOSIS — Z170 Estrogen receptor positive status [ER+]: Principal | ICD-10-CM

## 2017-12-10 DIAGNOSIS — C50511 Malignant neoplasm of lower-outer quadrant of right female breast: Secondary | ICD-10-CM

## 2017-12-11 ENCOUNTER — Inpatient Hospital Stay: Payer: 59 | Attending: Adult Health

## 2017-12-11 ENCOUNTER — Inpatient Hospital Stay: Payer: 59

## 2017-12-11 VITALS — BP 135/91 | HR 68 | Temp 98.9°F | Resp 16

## 2017-12-11 DIAGNOSIS — C50511 Malignant neoplasm of lower-outer quadrant of right female breast: Secondary | ICD-10-CM

## 2017-12-11 DIAGNOSIS — Z5112 Encounter for antineoplastic immunotherapy: Secondary | ICD-10-CM | POA: Diagnosis not present

## 2017-12-11 DIAGNOSIS — Z170 Estrogen receptor positive status [ER+]: Secondary | ICD-10-CM

## 2017-12-11 LAB — CMP (CANCER CENTER ONLY)
ALBUMIN: 3.7 g/dL (ref 3.5–5.0)
ALT: 17 U/L (ref 0–44)
ANION GAP: 8 (ref 5–15)
AST: 18 U/L (ref 15–41)
Alkaline Phosphatase: 47 U/L (ref 38–126)
BUN: 16 mg/dL (ref 8–23)
CHLORIDE: 105 mmol/L (ref 98–111)
CO2: 26 mmol/L (ref 22–32)
CREATININE: 0.78 mg/dL (ref 0.44–1.00)
Calcium: 9.3 mg/dL (ref 8.9–10.3)
GFR, Estimated: >60 mL/min (ref >60)
GLUCOSE: 80 mg/dL (ref 70–99)
Potassium: 4.5 mmol/L (ref 3.5–5.1)
SODIUM: 139 mmol/L (ref 135–145)
Total Bilirubin: 0.4 mg/dL (ref 0.3–1.2)
Total Protein: 6.6 g/dL (ref 6.5–8.1)

## 2017-12-11 LAB — CBC WITH DIFFERENTIAL (CANCER CENTER ONLY)
Abs Immature Granulocytes: 0.01 10*3/uL (ref 0.00–0.07)
BASOS ABS: 0 10*3/uL (ref 0.0–0.1)
Basophils Relative: 1 %
Eosinophils Absolute: 0.1 10*3/uL (ref 0.0–0.5)
Eosinophils Relative: 2 %
HEMATOCRIT: 40.3 % (ref 36.0–46.0)
HEMOGLOBIN: 13.7 g/dL (ref 12.0–15.0)
IMMATURE GRANULOCYTES: 0 %
LYMPHS ABS: 1.5 10*3/uL (ref 0.7–4.0)
LYMPHS PCT: 22 %
MCH: 29.5 pg (ref 26.0–34.0)
MCHC: 34 g/dL (ref 30.0–36.0)
MCV: 86.7 fL (ref 80.0–100.0)
MONOS PCT: 8 %
Monocytes Absolute: 0.5 10*3/uL (ref 0.1–1.0)
NEUTROS PCT: 67 %
NRBC: 0 % (ref 0.0–0.2)
Neutro Abs: 4.5 10*3/uL (ref 1.7–7.7)
Platelet Count: 253 10*3/uL (ref 150–400)
RBC: 4.65 MIL/uL (ref 3.87–5.11)
RDW: 12.2 % (ref 11.5–15.5)
WBC Count: 6.6 10*3/uL (ref 4.0–10.5)

## 2017-12-11 MED ORDER — SODIUM CHLORIDE 0.9 % IV SOLN
Freq: Once | INTRAVENOUS | Status: AC
  Administered 2017-12-11: 12:00:00 via INTRAVENOUS
  Filled 2017-12-11: qty 250

## 2017-12-11 MED ORDER — SODIUM CHLORIDE 0.9% FLUSH
10.0000 mL | INTRAVENOUS | Status: DC | PRN
  Administered 2017-12-11: 10 mL
  Filled 2017-12-11: qty 10

## 2017-12-11 MED ORDER — HEPARIN SOD (PORK) LOCK FLUSH 100 UNIT/ML IV SOLN
500.0000 [IU] | Freq: Once | INTRAVENOUS | Status: AC | PRN
  Administered 2017-12-11: 500 [IU]
  Filled 2017-12-11: qty 5

## 2017-12-11 MED ORDER — ACETAMINOPHEN 325 MG PO TABS
ORAL_TABLET | ORAL | Status: AC
  Filled 2017-12-11: qty 2

## 2017-12-11 MED ORDER — TRASTUZUMAB CHEMO 150 MG IV SOLR
300.0000 mg | Freq: Once | INTRAVENOUS | Status: AC
  Administered 2017-12-11: 300 mg via INTRAVENOUS
  Filled 2017-12-11: qty 14.3

## 2017-12-11 MED ORDER — ACETAMINOPHEN 325 MG PO TABS
650.0000 mg | ORAL_TABLET | Freq: Once | ORAL | Status: AC
  Administered 2017-12-11: 650 mg via ORAL

## 2017-12-11 NOTE — Patient Instructions (Signed)
[facility] Discharge Instructions for Patients Receiving Chemotherapy  Today you received the following chemotherapy agents: Herceptin (traztuzumab).  To help prevent nausea and vomiting after your treatment, we encourage you to take your nausea medication as prescribed.   If you develop nausea and vomiting that is not controlled by your nausea medication, call the clinic.   BELOW ARE SYMPTOMS THAT SHOULD BE REPORTED IMMEDIATELY:  *FEVER GREATER THAN 100.5 F  *CHILLS WITH OR WITHOUT FEVER  NAUSEA AND VOMITING THAT IS NOT CONTROLLED WITH YOUR NAUSEA MEDICATION  *UNUSUAL SHORTNESS OF BREATH  *UNUSUAL BRUISING OR BLEEDING  TENDERNESS IN MOUTH AND THROAT WITH OR WITHOUT PRESENCE OF ULCERS  *URINARY PROBLEMS  *BOWEL PROBLEMS  UNUSUAL RASH Items with * indicate a potential emergency and should be followed up as soon as possible.  Feel free to call the clinic should you have any questions or concerns. The clinic phone number is [phone].  Please show the [place] at check-in to the Emergency Department and triage nurse.

## 2017-12-19 ENCOUNTER — Ambulatory Visit
Admission: RE | Admit: 2017-12-19 | Discharge: 2017-12-19 | Disposition: A | Discharge status: Home / Self Care | Payer: 59 | Source: Ambulatory Visit | Attending: Oncology | Admitting: Oncology

## 2017-12-19 DIAGNOSIS — Z853 Personal history of malignant neoplasm of breast: Secondary | ICD-10-CM | POA: Diagnosis not present

## 2017-12-19 DIAGNOSIS — R922 Inconclusive mammogram: Secondary | ICD-10-CM | POA: Diagnosis not present

## 2017-12-19 DIAGNOSIS — C50511 Malignant neoplasm of lower-outer quadrant of right female breast: Secondary | ICD-10-CM

## 2017-12-19 DIAGNOSIS — Z170 Estrogen receptor positive status [ER+]: Principal | ICD-10-CM

## 2017-12-19 HISTORY — DX: Personal history of irradiation: Z92.3

## 2017-12-19 HISTORY — DX: Personal history of antineoplastic chemotherapy: Z92.21

## 2018-01-01 ENCOUNTER — Inpatient Hospital Stay: Payer: 59 | Attending: Adult Health

## 2018-01-01 ENCOUNTER — Inpatient Hospital Stay: Payer: 59

## 2018-01-01 VITALS — BP 143/95 | HR 69 | Temp 98.7°F | Resp 20

## 2018-01-01 DIAGNOSIS — Z809 Family history of malignant neoplasm, unspecified: Secondary | ICD-10-CM | POA: Diagnosis not present

## 2018-01-01 DIAGNOSIS — Z8051 Family history of malignant neoplasm of kidney: Secondary | ICD-10-CM | POA: Insufficient documentation

## 2018-01-01 DIAGNOSIS — C50511 Malignant neoplasm of lower-outer quadrant of right female breast: Secondary | ICD-10-CM

## 2018-01-01 DIAGNOSIS — Z5112 Encounter for antineoplastic immunotherapy: Secondary | ICD-10-CM | POA: Diagnosis not present

## 2018-01-01 DIAGNOSIS — Z170 Estrogen receptor positive status [ER+]: Secondary | ICD-10-CM | POA: Diagnosis not present

## 2018-01-01 DIAGNOSIS — R011 Cardiac murmur, unspecified: Secondary | ICD-10-CM | POA: Insufficient documentation

## 2018-01-01 DIAGNOSIS — F419 Anxiety disorder, unspecified: Secondary | ICD-10-CM | POA: Insufficient documentation

## 2018-01-01 DIAGNOSIS — K589 Irritable bowel syndrome without diarrhea: Secondary | ICD-10-CM | POA: Diagnosis not present

## 2018-01-01 DIAGNOSIS — M1990 Unspecified osteoarthritis, unspecified site: Secondary | ICD-10-CM | POA: Insufficient documentation

## 2018-01-01 DIAGNOSIS — Z95828 Presence of other vascular implants and grafts: Secondary | ICD-10-CM

## 2018-01-01 DIAGNOSIS — Z923 Personal history of irradiation: Secondary | ICD-10-CM | POA: Insufficient documentation

## 2018-01-01 DIAGNOSIS — Z79899 Other long term (current) drug therapy: Secondary | ICD-10-CM | POA: Diagnosis not present

## 2018-01-01 DIAGNOSIS — Z79810 Long term (current) use of selective estrogen receptor modulators (SERMs): Secondary | ICD-10-CM | POA: Insufficient documentation

## 2018-01-01 DIAGNOSIS — R251 Tremor, unspecified: Secondary | ICD-10-CM | POA: Insufficient documentation

## 2018-01-01 DIAGNOSIS — I10 Essential (primary) hypertension: Secondary | ICD-10-CM | POA: Insufficient documentation

## 2018-01-01 LAB — CBC WITH DIFFERENTIAL (CANCER CENTER ONLY)
ABS IMMATURE GRANULOCYTES: 0.02 10*3/uL (ref 0.00–0.07)
BASOS ABS: 0 10*3/uL (ref 0.0–0.1)
Basophils Relative: 1 %
Eosinophils Absolute: 0.2 10*3/uL (ref 0.0–0.5)
Eosinophils Relative: 3 %
HCT: 39 % (ref 36.0–46.0)
HEMOGLOBIN: 13.2 g/dL (ref 12.0–15.0)
Immature Granulocytes: 0 %
LYMPHS PCT: 23 %
Lymphs Abs: 1.3 10*3/uL (ref 0.7–4.0)
MCH: 29.6 pg (ref 26.0–34.0)
MCHC: 33.8 g/dL (ref 30.0–36.0)
MCV: 87.4 fL (ref 80.0–100.0)
MONO ABS: 0.5 10*3/uL (ref 0.1–1.0)
Monocytes Relative: 9 %
NEUTROS ABS: 3.5 10*3/uL (ref 1.7–7.7)
NRBC: 0 % (ref 0.0–0.2)
Neutrophils Relative %: 64 %
Platelet Count: 246 10*3/uL (ref 150–400)
RBC: 4.46 MIL/uL (ref 3.87–5.11)
RDW: 12.4 % (ref 11.5–15.5)
WBC: 5.4 10*3/uL (ref 4.0–10.5)

## 2018-01-01 LAB — CMP (CANCER CENTER ONLY)
ALT: 14 U/L (ref 0–44)
AST: 17 U/L (ref 15–41)
Albumin: 3.5 g/dL (ref 3.5–5.0)
Alkaline Phosphatase: 42 U/L (ref 38–126)
Anion gap: 6 (ref 5–15)
BUN: 15 mg/dL (ref 8–23)
CO2: 26 mmol/L (ref 22–32)
Calcium: 8.9 mg/dL (ref 8.9–10.3)
Chloride: 107 mmol/L (ref 98–111)
Creatinine: 0.79 mg/dL (ref 0.44–1.00)
GFR, Est AFR Am: >60 mL/min (ref >60)
Glucose, Bld: 90 mg/dL (ref 70–99)
Potassium: 4.3 mmol/L (ref 3.5–5.1)
Sodium: 139 mmol/L (ref 135–145)
Total Bilirubin: 0.4 mg/dL (ref 0.3–1.2)
Total Protein: 6.3 g/dL — ABNORMAL LOW (ref 6.5–8.1)

## 2018-01-01 MED ORDER — SODIUM CHLORIDE 0.9% FLUSH
10.0000 mL | INTRAVENOUS | Status: DC | PRN
  Administered 2018-01-01: 10 mL
  Filled 2018-01-01: qty 10

## 2018-01-01 MED ORDER — SODIUM CHLORIDE 0.9 % IV SOLN
Freq: Once | INTRAVENOUS | Status: AC
  Administered 2018-01-01: 11:00:00 via INTRAVENOUS
  Filled 2018-01-01: qty 250

## 2018-01-01 MED ORDER — TRASTUZUMAB CHEMO 150 MG IV SOLR
4.0000 mg/kg | Freq: Once | INTRAVENOUS | Status: AC
  Administered 2018-01-01: 210 mg via INTRAVENOUS
  Filled 2018-01-01: qty 10

## 2018-01-01 MED ORDER — SODIUM CHLORIDE 0.9% FLUSH
10.0000 mL | Freq: Once | INTRAVENOUS | Status: AC
  Administered 2018-01-01: 10 mL
  Filled 2018-01-01: qty 10

## 2018-01-01 MED ORDER — HEPARIN SOD (PORK) LOCK FLUSH 100 UNIT/ML IV SOLN
500.0000 [IU] | Freq: Once | INTRAVENOUS | Status: AC | PRN
  Administered 2018-01-01: 500 [IU]
  Filled 2018-01-01: qty 5

## 2018-01-01 MED ORDER — ACETAMINOPHEN 325 MG PO TABS
650.0000 mg | ORAL_TABLET | Freq: Once | ORAL | Status: AC
  Administered 2018-01-01: 650 mg via ORAL

## 2018-01-01 MED ORDER — ACETAMINOPHEN 325 MG PO TABS
ORAL_TABLET | ORAL | Status: AC
  Filled 2018-01-01: qty 2

## 2018-01-01 NOTE — Patient Instructions (Signed)
[facility] Discharge Instructions for Patients Receiving Chemotherapy Today you received the following chemotherapy agents:  Herceptin To help prevent nausea and vomiting after your treatment, we encourage you to take your nausea medication as prescribed.   If you develop nausea and vomiting that is not controlled by your nausea medication, call the clinic.   BELOW ARE SYMPTOMS THAT SHOULD BE REPORTED IMMEDIATELY:  *FEVER GREATER THAN 100.5 F  *CHILLS WITH OR WITHOUT FEVER  NAUSEA AND VOMITING THAT IS NOT CONTROLLED WITH YOUR NAUSEA MEDICATION  *UNUSUAL SHORTNESS OF BREATH  *UNUSUAL BRUISING OR BLEEDING  TENDERNESS IN MOUTH AND THROAT WITH OR WITHOUT PRESENCE OF ULCERS  *URINARY PROBLEMS  *BOWEL PROBLEMS  UNUSUAL RASH Items with * indicate a potential emergency and should be followed up as soon as possible.  Feel free to call the clinic should you have any questions or concerns. The clinic phone number is [phone].  Please show the CHEMO ALERT CARD at check-in to the Emergency Department and triage nurse.   

## 2018-01-02 DIAGNOSIS — Z6821 Body mass index (BMI) 21.0-21.9, adult: Secondary | ICD-10-CM | POA: Diagnosis not present

## 2018-01-02 DIAGNOSIS — Z01419 Encounter for gynecological examination (general) (routine) without abnormal findings: Secondary | ICD-10-CM | POA: Diagnosis not present

## 2018-01-02 DIAGNOSIS — N841 Polyp of cervix uteri: Secondary | ICD-10-CM | POA: Diagnosis not present

## 2018-01-07 ENCOUNTER — Other Ambulatory Visit: Payer: Self-pay | Admitting: Obstetrics and Gynecology

## 2018-01-10 DIAGNOSIS — H2513 Age-related nuclear cataract, bilateral: Secondary | ICD-10-CM | POA: Diagnosis not present

## 2018-01-14 DIAGNOSIS — I10 Essential (primary) hypertension: Secondary | ICD-10-CM | POA: Diagnosis not present

## 2018-01-14 DIAGNOSIS — C50311 Malignant neoplasm of lower-inner quadrant of right female breast: Secondary | ICD-10-CM | POA: Diagnosis not present

## 2018-01-14 DIAGNOSIS — Z9049 Acquired absence of other specified parts of digestive tract: Secondary | ICD-10-CM | POA: Diagnosis not present

## 2018-01-14 NOTE — Progress Notes (Signed)
[facility]  [phone] [phone]     ID: Katie Marquez DOB: 1955-08-30  MR#: [id number]  VHQ#:[id number]  Patient Care Team: Jonathon Jordan, MD as PCP - General (Family Medicine) Fanny Skates, MD as Consulting Physician (General Surgery) Moise Friday, Virgie Dad, MD as Consulting Physician (Oncology) Richmond Campbell, MD as Consulting Physician (Gastroenterology) Eppie Gibson, MD as Attending Physician (Radiation Oncology) Lovey Newcomer, MD as Attending Physician (Radiology) Bensimhon, Shaune Pascal, MD as Consulting Physician (Cardiology) Molli Posey, MD as Consulting Physician (Obstetrics and Gynecology) OTHER MD:  CHIEF COMPLAINT: Triple positive breast cancer  CURRENT TREATMENT: tamoxifen  HISTORY OF CURRENT ILLNESS: From the original intake note:  Katie Marquez had bilateral screening mammography with tomography at the breast center 12/12/2016, showing a possible mass in the right breast.  Right breast diagnostic mammography with tomography and right breast ultrasonography 12/15/2016 found the breast density to be category D.  In the lower right breast there was a small lobulated mass which was not palpable.  Ultrasound showed a 0.8 cm mass in the right breast 7:00 radiant 2 cm from the nipple.  The right axilla was benign.  Biopsy of the right breast mass in question 12/15/2016 showed ([id number]) and invasive ductal carcinoma, grade 3, estrogen receptor at 95% positive, progesterone receptor 50% positive, both with strong staining intensity, with an MIB-1 of 15%, and HER-2 amplification, the signals ratio being 1.83 but the number per cell 10.23.  The patient's subsequent history is as detailed below.  INTERVAL HISTORY: Katie Marquez returns today for follow-up of her triple positive breast cancer. She is accompanied by her husband.  The patient continues on trastuzumab, which she is tolerating well. Today is her final treatment.   The patient also  continues on tamoxifen, which she is tolerating well. She is experiencing night sweats. Her husband states that she sleeps fairly deep.   Echocardiogram on 10/28/2017, showed an ejection fraction in the 60% - 65% range.  Her next echocardiogram is scheduled for 02/08/2018  Since her last visit here, she underwent a diagnostic bilateral mammogram with tomography on 12/19/2017 showing Breast Density Category D. There is no mammographic evidence of malignancy involving either breast. There are expected post lumpectomy and post radiation changes involving the right breast.   REVIEW OF SYSTEMS: Katie Marquez is going well overall. She recently went to [place]. She also went to [place] for Thanksgiving and will go again at the end of year. For exercise, she is swimming twice a week and she is also walking. She states that her hair is growing back thin and fuzzy; it has gotten thicker and changed in nature more recently, however. The patient denies unusual headaches, visual changes, nausea, vomiting, or dizziness. There has been no unusual cough, phlegm production, or pleurisy. This been no change in bowel or bladder habits. The patient denies unexplained fatigue or unexplained weight loss, bleeding, rash, or fever. A detailed review of systems was otherwise noncontributory.    PAST MEDICAL HISTORY: Past Medical History:  Diagnosis Date  . Anxiety   . Cancer [facility])    breast cancer  . Diverticulosis   . Family history of kidney cancer   . Family history of thyroid cancer   . Heart murmur   . History of radiation therapy 05/09/17- 06/06/17   40.05 Gy directed to the right breast in 15 fractions followed by boost of 10 Gy in 5 fractions.   . Hypertension   . IBS (irritable bowel syndrome)   . Iron deficiency   .  Osteoarthritis   . Personal history of chemotherapy   . Personal history of radiation therapy   . PONV (postoperative nausea and vomiting)   . Tremors of nervous system    essential tremors     PAST SURGICAL HISTORY: Past Surgical History:  Procedure Laterality Date  . BREAST LUMPECTOMY    . BREAST LUMPECTOMY WITH RADIOACTIVE SEED AND SENTINEL LYMPH NODE BIOPSY Right 01/08/2017   Procedure: RIGHT BREAST LUMPECTOMY WITH RADIOACTIVE SEED AND RIGHT SENTINEL LYMPH NODE BIOPSY ERAS PATHWAY;  Surgeon: Fanny Skates, MD;  Location: [facility];  Service: General;  Laterality: Right;  PEC BLOCK  . LAPAROSCOPIC SIGMOID COLECTOMY    . LAPAROSCOPY    . PORTACATH PLACEMENT Left 01/08/2017   Procedure: INSERTION PORT-A-CATH WITH ULTRA SOUND;  Surgeon: Fanny Skates, MD;  Location: [facility] OR;  Service: General;  Laterality: Left;    FAMILY HISTORY Family History  Problem Relation Age of Onset  . Rheum arthritis Mother   . Hyperlipidemia Mother   . Hypertension Mother   . Thyroid cancer Mother 65  . Diverticulitis Father   . Parkinson's disease Father   . Kidney cancer Father 71  . Multiple sclerosis Maternal Aunt   . Breast cancer Neg Hx   Katie Marquez comes from an [place] background.  Her father died at 21 with Parkinson's disease and complications from tobacco abuse including a history of renal cancer; the patient's mother died at age 26, with a history of rheumatoid arthritis and thyroid cancer.  The patient had no brothers, 1 sister, in good health.  There is no history of breast or ovarian cancer in the family  GYNECOLOGIC HISTORY:  No LMP recorded. Patient is postmenopausal. Menarche age 30, first live birth age 23.  The patient is GX P1.  She had infertility treatments after her first live birth but they were not successful.  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  Their daughter Katie Marquez lives in [place] and previously attended [employer] and then the [employer] in  business and psychology. The patient's daughter got married in September 2019, and her husband is in Engineer, mining.     ADVANCED DIRECTIVES:    HEALTH MAINTENANCE: Social History   Tobacco Use  . Smoking status: Never Smoker  . Smokeless tobacco: Never Used  Substance Use Topics  . Alcohol use: Yes    Frequency: Never    Comment: occas  . Drug use: No     Colonoscopy: [facility]  PAP: [place]  Bone density: Osteopenia/ [name] around 2017   No Known Allergies  Current Outpatient Medications  Medication Sig Dispense Refill  . CALCIUM PO Take by mouth daily.    . cholecalciferol (VITAMIN D) 1000 units tablet Take 2,000 Units by mouth daily.    Marland Kitchen FLUoxetine (PROZAC) 10 MG capsule Take 20 mg by mouth daily.     Marland Kitchen lisinopril (PRINIVIL,ZESTRIL) 10 MG tablet Take 1 tablet (10 mg total) by mouth daily. 30 tablet 3  . Probiotic Product (PROBIOTIC-10 PO) Take by mouth.    . Red Yeast Rice 600 MG CAPS Take by mouth.    . tamoxifen (NOLVADEX) 20 MG tablet Take 1 tablet (20 mg total) by mouth daily. 90 tablet 3   No current facility-administered medications for this visit.     OBJECTIVE: Middle-aged white woman  who appears well  Vitals:   01/15/18 1028  BP: 124/79  Pulse: 75  Resp: 18  Temp: 98.6 F (37 C)  SpO2: 98%     Body mass index is 21.69 kg/m.   Wt Readings from Last 3 Encounters:  01/15/18 118 lb 9.6 oz (53.8 kg)  10/30/17 115 lb 8 oz (52.4 kg)  10/26/17 115 lb (52.2 kg)      ECOG FS:0 - Asymptomatic   Sclerae unicteric, pupils round and equal Oropharynx clear and moist No cervical or supraclavicular adenopathy Lungs no rales or rhonchi Heart regular rate and rhythm Abd soft, nontender, positive bowel sounds MSK no focal spinal tenderness, no upper extremity lymphedema Neuro: nonfocal, well oriented, appropriate affect Breasts: The right breast has undergone lumpectomy followed by radiation.  There is no evidence of local recurrence.  The cosmetic result is  good.  The left breast is unremarkable.  Both axillae are benign.  LAB RESULTS:  CMP     Component Value Date/Time   NA 140 01/15/2018 1026   NA 141 01/31/2017 1225   K 4.1 01/15/2018 1026   K 4.1 01/31/2017 1225   CL 105 01/15/2018 1026   CO2 27 01/15/2018 1026   CO2 29 01/31/2017 1225   GLUCOSE 104 (H) 01/15/2018 1026   GLUCOSE 86 01/31/2017 1225   BUN 19 01/15/2018 1026   BUN 17.4 01/31/2017 1225   CREATININE 0.78 01/15/2018 1026   CREATININE 0.8 01/31/2017 1225   CALCIUM 9.2 01/15/2018 1026   CALCIUM 10.0 01/31/2017 1225   PROT 6.5 01/15/2018 1026   PROT 7.2 01/31/2017 1225   ALBUMIN 3.7 01/15/2018 1026   ALBUMIN 4.0 01/31/2017 1225   AST 17 01/15/2018 1026   AST 18 01/31/2017 1225   ALT 18 01/15/2018 1026   ALT 29 01/31/2017 1225   ALKPHOS 43 01/15/2018 1026   ALKPHOS 66 01/31/2017 1225   BILITOT 0.4 01/15/2018 1026   BILITOT 0.57 01/31/2017 1225   GFRNONAA >60 01/15/2018 1026   GFRAA >60 01/15/2018 1026    No results found for: TOTALPROTELP, ALBUMINELP, A1GS, A2GS, BETS, BETA2SER, GAMS, MSPIKE, SPEI  No results found for: Nils Pyle, [facility]  Lab Results  Component Value Date   WBC 6.1 01/15/2018   NEUTROABS 4.1 01/15/2018   HGB 13.6 01/15/2018   HCT 39.9 01/15/2018   MCV 85.4 01/15/2018   PLT 231 01/15/2018      Chemistry      Component Value Date/Time   NA 140 01/15/2018 1026   NA 141 01/31/2017 1225   K 4.1 01/15/2018 1026   K 4.1 01/31/2017 1225   CL 105 01/15/2018 1026   CO2 27 01/15/2018 1026   CO2 29 01/31/2017 1225   BUN 19 01/15/2018 1026   BUN 17.4 01/31/2017 1225   CREATININE 0.78 01/15/2018 1026   CREATININE 0.8 01/31/2017 1225      Component Value Date/Time   CALCIUM 9.2 01/15/2018 1026   CALCIUM 10.0 01/31/2017 1225   ALKPHOS 43 01/15/2018 1026   ALKPHOS 66 01/31/2017 1225   AST 17 01/15/2018 1026   AST 18 01/31/2017 1225   ALT 18 01/15/2018 1026   ALT 29 01/31/2017 1225   BILITOT 0.4 01/15/2018 1026    BILITOT 0.57 01/31/2017 1225       No results found for: LABCA2  No components found for: [id number]  No results for input(s): INR in the last 168 hours.  No results found for: LABCA2  No results found for: XAJ287  No results  found for: WJX914  No results found for: NWG956  No results found for: CA2729  No components found for: HGQUANT  No results found for: CEA1 / No results found for: CEA1   No results found for: AFPTUMOR  No results found for: [place]  No results found for: PSA1  Appointment on 01/15/2018  Component Date Value Ref Range Status  . Sodium 01/15/2018 140  135 - 145 mmol/L Final  . Potassium 01/15/2018 4.1  3.5 - 5.1 mmol/L Final  . Chloride 01/15/2018 105  98 - 111 mmol/L Final  . CO2 01/15/2018 27  22 - 32 mmol/L Final  . Glucose, Bld 01/15/2018 104* 70 - 99 mg/dL Final  . BUN 01/15/2018 19  8 - 23 mg/dL Final  . Creatinine 01/15/2018 0.78  0.44 - 1.00 mg/dL Final  . Calcium 01/15/2018 9.2  8.9 - 10.3 mg/dL Final  . Total Protein 01/15/2018 6.5  6.5 - 8.1 g/dL Final  . Albumin 01/15/2018 3.7  3.5 - 5.0 g/dL Final  . AST 01/15/2018 17  15 - 41 U/L Final  . ALT 01/15/2018 18  0 - 44 U/L Final  . Alkaline Phosphatase 01/15/2018 43  38 - 126 U/L Final  . Total Bilirubin 01/15/2018 0.4  0.3 - 1.2 mg/dL Final  . GFR, Est Non Af Am 01/15/2018 >60  >60 mL/min Final  . GFR, Est AFR Am 01/15/2018 >60  >60 mL/min Final  . Anion gap 01/15/2018 8  5 - 15 Final   Performed at [facility], [place] [address], [place], [place] [address]  . WBC Count 01/15/2018 6.1  4.0 - 10.5 K/uL Final  . RBC 01/15/2018 4.67  3.87 - 5.11 MIL/uL Final  . Hemoglobin 01/15/2018 13.6  12.0 - 15.0 g/dL Final  . HCT 01/15/2018 39.9  36.0 - 46.0 % Final  . MCV 01/15/2018 85.4  80.0 - 100.0 fL Final  . MCH 01/15/2018 29.1  26.0 - 34.0 pg Final  . MCHC 01/15/2018 34.1  30.0 - 36.0 g/dL Final  . RDW 01/15/2018 12.2  11.5 - 15.5 % Final  . Platelet Count  01/15/2018 231  150 - 400 K/uL Final  . nRBC 01/15/2018 0.0  0.0 - 0.2 % Final  . Neutrophils Relative % 01/15/2018 68  % Final  . Neutro Abs 01/15/2018 4.1  1.7 - 7.7 K/uL Final  . Lymphocytes Relative 01/15/2018 22  % Final  . Lymphs Abs 01/15/2018 1.3  0.7 - 4.0 K/uL Final  . Monocytes Relative 01/15/2018 8  % Final  . Monocytes Absolute 01/15/2018 0.5  0.1 - 1.0 K/uL Final  . Eosinophils Relative 01/15/2018 2  % Final  . Eosinophils Absolute 01/15/2018 0.1  0.0 - 0.5 K/uL Final  . Basophils Relative 01/15/2018 0  % Final  . Basophils Absolute 01/15/2018 0.0  0.0 - 0.1 K/uL Final  . Immature Granulocytes 01/15/2018 0  % Final  . Abs Immature Granulocytes 01/15/2018 0.01  0.00 - 0.07 K/uL Final   Performed at [facility], [place] [address], [place], [place] [address]    (this displays the last labs from the last 3 days)  No results found for: TOTALPROTELP, ALBUMINELP, A1GS, A2GS, BETS, BETA2SER, GAMS, MSPIKE, SPEI (this displays SPEP labs)  No results found for: KPAFRELGTCHN, LAMBDASER, KAPLAMBRATIO (kappa/lambda light chains)  No results found for: HGBA, HGBA2QUANT, HGBFQUANT, HGBSQUAN (Hemoglobinopathy evaluation)   No results found for: LDH  No results found for: IRON, TIBC, IRONPCTSAT (Iron and TIBC)  No results found for: FERRITIN  Urinalysis No results found for: COLORURINE, APPEARANCEUR, LABSPEC, PHURINE, GLUCOSEU, HGBUR, BILIRUBINUR, KETONESUR, PROTEINUR, UROBILINOGEN, NITRITE, LEUKOCYTESUR   STUDIES: Mm Diag Breast Tomo Bilateral  Result Date: 12/19/2017 CLINICAL DATA:  62 year old who underwent malignant lumpectomy of the LOWER OUTER QUADRANT of the RIGHT breast in December, 2018 with adjuvant radiation therapy.This is the patient's initial post lumpectomy annual mammogram. EXAM: DIGITAL DIAGNOSTIC BILATERAL MAMMOGRAM WITH CAD AND TOMO COMPARISON:  01/08/2017 (RIGHT), 01/05/2017 (RIGHT), 12/15/2016 (RIGHT), 12/12/2016 (BILATERAL) and  earlier. ACR Breast Density Category d: The breast tissue is extremely dense, which lowers the sensitivity of mammography. FINDINGS: Tomosynthesis and synthesized full field CC and MLO views of both breasts were obtained. Standard 2D spot magnification CC view of the lumpectomy site in RIGHT breast was also obtained. Post surgical scar/architectural distortion at the lumpectomy site in the LOWER INNER RIGHT breast at MIDDLE depth. Post radiation skin thickening and trabecular thickening is present throughout the RIGHT breast. No findings suspicious for malignancy in the RIGHT breast. No findings suspicious for malignancy in the LEFT breast. The port for the indwelling Port-A-Cath overlies the LEFT axilla on the MLO view. Mammographic images were processed with CAD. IMPRESSION: 1. No mammographic evidence of malignancy involving either breast. 2. Expected post lumpectomy and post radiation changes involving the RIGHT breast. RECOMMENDATION: BILATERAL diagnostic mammography in 1 year. I have discussed the findings and recommendations with the patient. Results were also provided in writing at the conclusion of the visit. If applicable, a reminder letter will be sent to the patient regarding the next appointment. BI-RADS CATEGORY  2: Benign. Electronically Signed   By: Evangeline Dakin M.D.   On: 12/19/2017 12:10     ELIGIBLE FOR AVAILABLE RESEARCH PROTOCOL: no  ASSESSMENT: 62 y.o. Protection woman status post right breast lower outer quadrant biopsy 12/15/2016 for a clinical T1b N0, stage IA invasive ductal carcinoma, grade 3, triple positive, with an MIB-1 of 15%  (1) status post right lumpectomy 01/08/2017 for a  pT1c pN0, stage IA invasive ductal carcinoma, grade 2, with negative margins.    (2) adjuvant chemotherapy consisting of paclitaxel weekly x12, together with trastuzumab every 21 days started 01/31/2017  (3) trastuzumab continued to total 1 year (through December 2019)  (a) echocardiogram  01/17/2017 demonstrates a LVEF of 65-70%  (b) echocardiogram 04/19/2017 showed an ejection fraction in the 60-65% range  (c) echocardiogram 07/20/2017 showed an ejection fraction in the 55-60% range  (d) echocardiogram 10/26/2017 showed an ejection fraction in the 60-65% range  (4) adjuvant radiation completed 06/06/2017 Site/dose:   40.05 Gy directed to the right breast in 15 fractions followed by boost of 10 Gy in 5 fractions  (5) started tamoxifen 06/30/2017  (6) genetics testing completed on 02/20/2017 offered through [facility] showed: no deleterious mutations. The following genes were evaluated for sequence changes and exonic deletions/duplications: APC, ATM, AXIN2, BAP1, BARD1, BMPR1A, BRCA1, BRCA2, BRIP1, BUB1B, CDC73, CDH1, CDK4, CDKN1C, CDKN2A (p14ARF), CDKN2A (p16INK4a), CEP57, CHEK2, CTNNA1, DICER1, DIS3L2, EPCAM*, FH, FLCN, GPC3, GREM1*, KIT, MEN1, MET, MLH1, MSH2, MSH3, MSH6, MUTYH, NBN, NF1, PALB2, PDGFRA, PMS2, POLD1, POLE, PTEN, RAD50, RAD51C, RAD51D, SDHB,SDHC, SDHD, SMAD4, SMARCA4, SMARCB1, STK11, TP53, TSC1, TSC2, VHL, WT1. The following genes were evaluated for sequence changes only: HOXB13*, MITF*, NTHL1*, SDHA. Results are negative unless otherwise indicated  (7) intensified screening: Breast density category D, first live birth after age 40, Ashkenazi background (>20% lifetime)  (a) mammography November yearly  (b) breast MRI may yearly until breast density drops  to B or lower  PLAN: Katie Marquez is completing her trastuzumab treatments today.  She has done very well with them.  She is scheduled for her final echocardiogram 02/08/2018.  She is already scheduled to have her port removed the second week in January.  She is tolerating tamoxifen well.  The plan will be to continue that a total of 5 years.  We just obtained her updated mammography and her breast continued to be density category D.  This just about doubles the risk of breast cancer and it also makes mammograms and  sensitive.  I think she will benefit from yearly breast MRI in addition to her yearly mammography, and she will have her MRI in May.  I will see her shortly thereafter for follow-up  I am delighted that she has done so well.  She tells me she does not feel like she is any different than she was before she had a diagnosis of cancer.  I do think a diagnosis of cancer however is a watershed in a person's life and I alerted her to be watching out for different ways of feeling or acting or thinking over the next 3years.  If these develop they are not abnormal but rather part of a normal period of adjustment.  In any case she will see me again in May.  She knows to call for any other issue that may develop before then.      Saanvi Hakala, Virgie Dad, MD  01/15/18 11:04 AM Medical Oncology and Hematology [facility] [address] [place], [place] [address] Tel. [phone]    Fax. [phone]   I, Jacqualyn Posey am acting as a Education administrator for Chauncey Cruel, MD.   I, Lurline Del MD, have reviewed the above documentation for accuracy and completeness, and I agree with the above.

## 2018-01-15 ENCOUNTER — Inpatient Hospital Stay: Payer: 59

## 2018-01-15 ENCOUNTER — Inpatient Hospital Stay (HOSPITAL_BASED_OUTPATIENT_CLINIC_OR_DEPARTMENT_OTHER): Payer: 59 | Admitting: Oncology

## 2018-01-15 ENCOUNTER — Telehealth: Payer: Self-pay | Admitting: Oncology

## 2018-01-15 ENCOUNTER — Encounter: Payer: Self-pay | Admitting: *Deleted

## 2018-01-15 VITALS — BP 124/79 | HR 75 | Temp 98.6°F | Resp 18 | Ht 62.0 in | Wt 118.6 lb

## 2018-01-15 DIAGNOSIS — Z79810 Long term (current) use of selective estrogen receptor modulators (SERMs): Secondary | ICD-10-CM | POA: Diagnosis not present

## 2018-01-15 DIAGNOSIS — C50511 Malignant neoplasm of lower-outer quadrant of right female breast: Secondary | ICD-10-CM | POA: Diagnosis not present

## 2018-01-15 DIAGNOSIS — R011 Cardiac murmur, unspecified: Secondary | ICD-10-CM

## 2018-01-15 DIAGNOSIS — R251 Tremor, unspecified: Secondary | ICD-10-CM

## 2018-01-15 DIAGNOSIS — F419 Anxiety disorder, unspecified: Secondary | ICD-10-CM

## 2018-01-15 DIAGNOSIS — Z5112 Encounter for antineoplastic immunotherapy: Secondary | ICD-10-CM

## 2018-01-15 DIAGNOSIS — Z809 Family history of malignant neoplasm, unspecified: Secondary | ICD-10-CM

## 2018-01-15 DIAGNOSIS — R922 Inconclusive mammogram: Secondary | ICD-10-CM

## 2018-01-15 DIAGNOSIS — Z923 Personal history of irradiation: Secondary | ICD-10-CM

## 2018-01-15 DIAGNOSIS — Z79899 Other long term (current) drug therapy: Secondary | ICD-10-CM

## 2018-01-15 DIAGNOSIS — Z95828 Presence of other vascular implants and grafts: Secondary | ICD-10-CM

## 2018-01-15 DIAGNOSIS — Z170 Estrogen receptor positive status [ER+]: Secondary | ICD-10-CM | POA: Diagnosis not present

## 2018-01-15 DIAGNOSIS — Z8051 Family history of malignant neoplasm of kidney: Secondary | ICD-10-CM

## 2018-01-15 DIAGNOSIS — M1990 Unspecified osteoarthritis, unspecified site: Secondary | ICD-10-CM

## 2018-01-15 DIAGNOSIS — K589 Irritable bowel syndrome without diarrhea: Secondary | ICD-10-CM

## 2018-01-15 DIAGNOSIS — I10 Essential (primary) hypertension: Secondary | ICD-10-CM

## 2018-01-15 LAB — CBC WITH DIFFERENTIAL (CANCER CENTER ONLY)
Abs Immature Granulocytes: 0.01 10*3/uL (ref 0.00–0.07)
Basophils Absolute: 0 10*3/uL (ref 0.0–0.1)
Basophils Relative: 0 %
EOS PCT: 2 %
Eosinophils Absolute: 0.1 10*3/uL (ref 0.0–0.5)
HCT: 39.9 % (ref 36.0–46.0)
Hemoglobin: 13.6 g/dL (ref 12.0–15.0)
Immature Granulocytes: 0 %
Lymphocytes Relative: 22 %
Lymphs Abs: 1.3 10*3/uL (ref 0.7–4.0)
MCH: 29.1 pg (ref 26.0–34.0)
MCHC: 34.1 g/dL (ref 30.0–36.0)
MCV: 85.4 fL (ref 80.0–100.0)
Monocytes Absolute: 0.5 10*3/uL (ref 0.1–1.0)
Monocytes Relative: 8 %
Neutro Abs: 4.1 10*3/uL (ref 1.7–7.7)
Neutrophils Relative %: 68 %
Platelet Count: 231 10*3/uL (ref 150–400)
RBC: 4.67 MIL/uL (ref 3.87–5.11)
RDW: 12.2 % (ref 11.5–15.5)
WBC Count: 6.1 10*3/uL (ref 4.0–10.5)
nRBC: 0 % (ref 0.0–0.2)

## 2018-01-15 LAB — CMP (CANCER CENTER ONLY)
ALT: 18 U/L (ref 0–44)
AST: 17 U/L (ref 15–41)
Albumin: 3.7 g/dL (ref 3.5–5.0)
Alkaline Phosphatase: 43 U/L (ref 38–126)
Anion gap: 8 (ref 5–15)
BUN: 19 mg/dL (ref 8–23)
CO2: 27 mmol/L (ref 22–32)
Calcium: 9.2 mg/dL (ref 8.9–10.3)
Chloride: 105 mmol/L (ref 98–111)
Creatinine: 0.78 mg/dL (ref 0.44–1.00)
GFR, Est AFR Am: >60 mL/min (ref >60)
GFR, Estimated: >60 mL/min (ref >60)
Glucose, Bld: 104 mg/dL — ABNORMAL HIGH (ref 70–99)
Potassium: 4.1 mmol/L (ref 3.5–5.1)
Sodium: 140 mmol/L (ref 135–145)
Total Bilirubin: 0.4 mg/dL (ref 0.3–1.2)
Total Protein: 6.5 g/dL (ref 6.5–8.1)

## 2018-01-15 MED ORDER — HEPARIN SOD (PORK) LOCK FLUSH 100 UNIT/ML IV SOLN
500.0000 [IU] | Freq: Once | INTRAVENOUS | Status: AC | PRN
  Administered 2018-01-15: 500 [IU]
  Filled 2018-01-15: qty 5

## 2018-01-15 MED ORDER — ACETAMINOPHEN 325 MG PO TABS
ORAL_TABLET | ORAL | Status: AC
  Filled 2018-01-15: qty 2

## 2018-01-15 MED ORDER — SODIUM CHLORIDE 0.9 % IV SOLN
Freq: Once | INTRAVENOUS | Status: AC
  Administered 2018-01-15: 12:00:00 via INTRAVENOUS
  Filled 2018-01-15: qty 250

## 2018-01-15 MED ORDER — SODIUM CHLORIDE 0.9% FLUSH
10.0000 mL | Freq: Once | INTRAVENOUS | Status: AC
  Administered 2018-01-15: 10 mL
  Filled 2018-01-15: qty 10

## 2018-01-15 MED ORDER — TRASTUZUMAB CHEMO 150 MG IV SOLR
300.0000 mg | Freq: Once | INTRAVENOUS | Status: AC
  Administered 2018-01-15: 300 mg via INTRAVENOUS
  Filled 2018-01-15: qty 14.3

## 2018-01-15 MED ORDER — ACETAMINOPHEN 325 MG PO TABS
650.0000 mg | ORAL_TABLET | Freq: Once | ORAL | Status: AC
  Administered 2018-01-15: 650 mg via ORAL

## 2018-01-15 MED ORDER — SODIUM CHLORIDE 0.9% FLUSH
10.0000 mL | INTRAVENOUS | Status: DC | PRN
  Administered 2018-01-15: 10 mL
  Filled 2018-01-15: qty 10

## 2018-01-15 NOTE — Telephone Encounter (Signed)
Gave patient avs and calendar.   °

## 2018-01-18 ENCOUNTER — Telehealth: Payer: Self-pay | Admitting: Oncology

## 2018-01-18 NOTE — Telephone Encounter (Signed)
Per 12/17 sch message - sent reminder letter in the mail with appt date and time

## 2018-01-22 ENCOUNTER — Ambulatory Visit: Payer: 59

## 2018-01-22 ENCOUNTER — Other Ambulatory Visit: Payer: 59

## 2018-02-01 DIAGNOSIS — C50911 Malignant neoplasm of unspecified site of right female breast: Secondary | ICD-10-CM | POA: Diagnosis not present

## 2018-02-01 DIAGNOSIS — Z452 Encounter for adjustment and management of vascular access device: Secondary | ICD-10-CM | POA: Diagnosis not present

## 2018-02-07 DIAGNOSIS — M62838 Other muscle spasm: Secondary | ICD-10-CM | POA: Diagnosis not present

## 2018-02-07 DIAGNOSIS — M9902 Segmental and somatic dysfunction of thoracic region: Secondary | ICD-10-CM | POA: Diagnosis not present

## 2018-02-07 DIAGNOSIS — M546 Pain in thoracic spine: Secondary | ICD-10-CM | POA: Diagnosis not present

## 2018-02-08 ENCOUNTER — Ambulatory Visit (HOSPITAL_BASED_OUTPATIENT_CLINIC_OR_DEPARTMENT_OTHER)
Admission: RE | Admit: 2018-02-08 | Discharge: 2018-02-08 | Disposition: A | Discharge status: Home / Self Care | Payer: 59 | Source: Ambulatory Visit | Attending: Cardiology | Admitting: Cardiology

## 2018-02-08 ENCOUNTER — Ambulatory Visit (HOSPITAL_COMMUNITY)
Admission: RE | Admit: 2018-02-08 | Discharge: 2018-02-08 | Disposition: A | Discharge status: Home / Self Care | Payer: 59 | Source: Ambulatory Visit | Attending: Cardiology | Admitting: Cardiology

## 2018-02-08 ENCOUNTER — Encounter (HOSPITAL_COMMUNITY): Payer: Self-pay | Admitting: Cardiology

## 2018-02-08 VITALS — BP 106/66 | HR 76 | Wt 121.0 lb

## 2018-02-08 DIAGNOSIS — R011 Cardiac murmur, unspecified: Secondary | ICD-10-CM | POA: Diagnosis not present

## 2018-02-08 DIAGNOSIS — R9431 Abnormal electrocardiogram [ECG] [EKG]: Secondary | ICD-10-CM | POA: Insufficient documentation

## 2018-02-08 DIAGNOSIS — Z8249 Family history of ischemic heart disease and other diseases of the circulatory system: Secondary | ICD-10-CM | POA: Insufficient documentation

## 2018-02-08 DIAGNOSIS — Z170 Estrogen receptor positive status [ER+]: Secondary | ICD-10-CM | POA: Diagnosis not present

## 2018-02-08 DIAGNOSIS — Z79899 Other long term (current) drug therapy: Secondary | ICD-10-CM | POA: Insufficient documentation

## 2018-02-08 DIAGNOSIS — C50511 Malignant neoplasm of lower-outer quadrant of right female breast: Secondary | ICD-10-CM

## 2018-02-08 DIAGNOSIS — Z79810 Long term (current) use of selective estrogen receptor modulators (SERMs): Secondary | ICD-10-CM | POA: Insufficient documentation

## 2018-02-08 DIAGNOSIS — I10 Essential (primary) hypertension: Secondary | ICD-10-CM | POA: Insufficient documentation

## 2018-02-08 DIAGNOSIS — Z800 Family history of malignant neoplasm of digestive organs: Secondary | ICD-10-CM | POA: Diagnosis not present

## 2018-02-08 NOTE — Progress Notes (Signed)
  Echocardiogram 2D Echocardiogram has been performed.  Johny Chess 02/08/2018, 11:45 AM

## 2018-02-08 NOTE — Patient Instructions (Signed)
Follow up as needed

## 2018-02-10 NOTE — Progress Notes (Signed)
Oncology: Dr. Jana Hakim  63 y.o. with history of HTN and breast cancer was referred by Dr. Jana Hakim for cardio-oncology evaluation.    Right breast cancer was diagnosed 11/18, ER+/PR+/HER2+.  She had lumpectomy in 12/18.  She has completed paclitaxel x 12 cycles.  She will continue trastuzumab x 1 year.  She will have radiation.    No complaints today, no dyspnea or chest pain. BP is controlled on lisinopril.   PMH: 1. Tremor 2. IBS 3. HTN 4. Breast cancer: Right breast cancer diagnosed 11/18, ER+/PR+/HER2+.  She had lumpectomy in 12/18.  She has completed paclitaxel x 12 cycles.  She will continue trastuzumab x 1 year.  She will have radiation.  - Echo (3/19): EF 60-65%, normal wall thickness.  GLS -24.9%.  - Echo (6/19): EF 60-65%, GLS -24%, no definite VSD, trivial MR, trivial AI, normal RV size and systolic function.  - Echo (9/19): EF 60-65%, GLS -21.3% - Echo (1/20): EF 60-65%, GLS -19.4%  Social History   Socioeconomic History  . Marital status: Married    Spouse name: Not on file  . Number of children: Not on file  . Years of education: Not on file  . Highest education level: Not on file  Occupational History  . Not on file  Social Needs  . Financial resource strain: Not on file  . Food insecurity:    Worry: Not on file    Inability: Not on file  . Transportation needs:    Medical: Not on file    Non-medical: Not on file  Tobacco Use  . Smoking status: Never Smoker  . Smokeless tobacco: Never Used  Substance and Sexual Activity  . Alcohol use: Yes    Frequency: Never    Comment: occas  . Drug use: No  . Sexual activity: Not on file  Lifestyle  . Physical activity:    Days per week: Not on file    Minutes per session: Not on file  . Stress: Not on file  Relationships  . Social connections:    Talks on phone: Not on file    Gets together: Not on file    Attends religious service: Not on file    Active member of club or organization: Not on file    Attends  meetings of clubs or organizations: Not on file    Relationship status: Not on file  . Intimate partner violence:    Fear of current or ex partner: Not on file    Emotionally abused: Not on file    Physically abused: Not on file    Forced sexual activity: Not on file  Other Topics Concern  . Not on file  Social History Narrative  . Not on file   Family History  Problem Relation Age of Onset  . Rheum arthritis Mother   . Hyperlipidemia Mother   . Hypertension Mother   . Thyroid cancer Mother 40  . Diverticulitis Father   . Parkinson's disease Father   . Kidney cancer Father 70  . Multiple sclerosis Maternal Aunt   . Breast cancer Neg Hx    ROS: All systems reviewed and negative except as per HPI.   Current Outpatient Medications  Medication Sig Dispense Refill  . CALCIUM PO Take by mouth daily.    . cholecalciferol (VITAMIN D) 1000 units tablet Take 2,000 Units by mouth daily.    Marland Kitchen FLUoxetine (PROZAC) 10 MG capsule Take 20 mg by mouth daily.     Marland Kitchen lisinopril (PRINIVIL,ZESTRIL) 10  MG tablet Take 1 tablet (10 mg total) by mouth daily. (Patient taking differently: Take 20 mg by mouth daily. ) 30 tablet 3  . Probiotic Product (PROBIOTIC-10 PO) Take by mouth.    . Red Yeast Rice 600 MG CAPS Take by mouth.    . tamoxifen (NOLVADEX) 20 MG tablet Take 1 tablet (20 mg total) by mouth daily. 90 tablet 3   No current facility-administered medications for this encounter.    BP 106/66   Pulse 76   Wt 54.9 kg (121 lb)   SpO2 98%   BMI 22.13 kg/m  General: NAD Neck: No JVD, no thyromegaly or thyroid nodule.  Lungs: Clear to auscultation bilaterally with normal respiratory effort. CV: Nondisplaced PMI.  Heart regular S1/S2, no S3/S4, 1/6 SEM RUSB.  No peripheral edema.  No carotid bruit.  Normal pedal pulses.  Abdomen: Soft, nontender, no hepatosplenomegaly, no distention.  Skin: Intact without lesions or rashes.  Neurologic: Alert and oriented x 3.  Psych: Normal  affect. Extremities: No clubbing or cyanosis.  HEENT: Normal.   Assessment/Plan: 1. HTN: BP appears controlled on current dose of lisinopril. 2. Breast cancer: She has completed Herceptin. I reviewed today's echo, stable EF and strain pattern. - She will not need further screening echoes.  3. Abnormal echo: Echo from 12/18 describes moderate focal basal septal hypertrophy with mitral valve SAM, which suggests hypertrophic cardiomyopathy, and a supracristal VSD was reported. Echoes on our office do not show LVH or mitral valve SAM, and no VSD is visualized. She also has a very minimal murmur on exam which does not suggest a VSD or HCM with LVOT obstruction.  Followup prn.  Loralie Champagne 02/10/2018

## 2018-02-18 DIAGNOSIS — H6123 Impacted cerumen, bilateral: Secondary | ICD-10-CM | POA: Diagnosis not present

## 2018-03-15 DIAGNOSIS — H04123 Dry eye syndrome of bilateral lacrimal glands: Secondary | ICD-10-CM | POA: Diagnosis not present

## 2018-04-12 DIAGNOSIS — K5790 Diverticulosis of intestine, part unspecified, without perforation or abscess without bleeding: Secondary | ICD-10-CM | POA: Diagnosis not present

## 2018-05-31 ENCOUNTER — Other Ambulatory Visit: Payer: 59

## 2018-06-20 ENCOUNTER — Telehealth: Payer: Self-pay | Admitting: Oncology

## 2018-06-20 ENCOUNTER — Telehealth: Payer: Self-pay

## 2018-06-20 NOTE — Telephone Encounter (Signed)
Patient called to clarify appts - MD 5/27 and MRI 6/3 - advised her Magrinat wants the appt moved until after the MRI and he has communicated that to scheduling. I told her she should expect to hear from our scheduling team the next day or two. She was also pleased to know that the "twinges" she was feeling he stated were due to her post surgical pain and not anything to be concerned about now. She was very pleased to hear that and will see [place] as scheduled post MRI.

## 2018-06-20 NOTE — Telephone Encounter (Signed)
Rescheduled 5/27 appt per sch msg. Called and left msg for patient  °

## 2018-06-26 ENCOUNTER — Inpatient Hospital Stay: Payer: 59 | Admitting: Oncology

## 2018-06-26 ENCOUNTER — Inpatient Hospital Stay: Payer: 59

## 2018-07-03 ENCOUNTER — Ambulatory Visit
Admission: RE | Admit: 2018-07-03 | Discharge: 2018-07-03 | Disposition: A | Discharge status: Home / Self Care | Payer: 59 | Source: Ambulatory Visit | Attending: Oncology | Admitting: Oncology

## 2018-07-03 ENCOUNTER — Other Ambulatory Visit: Payer: Self-pay

## 2018-07-03 DIAGNOSIS — C50511 Malignant neoplasm of lower-outer quadrant of right female breast: Secondary | ICD-10-CM

## 2018-07-03 DIAGNOSIS — R922 Inconclusive mammogram: Secondary | ICD-10-CM

## 2018-07-03 MED ORDER — GADOBUTROL 1 MMOL/ML IV SOLN
5.0000 mL | Freq: Once | INTRAVENOUS | Status: AC | PRN
  Administered 2018-07-03: 5 mL via INTRAVENOUS

## 2018-07-08 ENCOUNTER — Telehealth: Payer: Self-pay

## 2018-07-08 NOTE — Telephone Encounter (Signed)
Spoke with patient to inform of normal MRI results.  Patient voiced understanding and thanks for call. 

## 2018-07-08 NOTE — Telephone Encounter (Signed)
-----   Message from Gardenia Phlegm, NP sent at 07/08/2018 12:48 PM EDT ----- MRI normal, please notify patient ----- Message ----- From: Interface, Rad Results In Sent: 07/03/2018   3:53 PM EDT To: Chauncey Cruel, MD

## 2018-07-16 ENCOUNTER — Telehealth: Payer: Self-pay | Admitting: Adult Health

## 2018-07-16 NOTE — Telephone Encounter (Signed)
Called patient regarding upcoming Webex appointment, patient is notified and e-mail has been sent. °

## 2018-07-18 ENCOUNTER — Telehealth: Payer: Self-pay | Admitting: Oncology

## 2018-07-19 ENCOUNTER — Encounter: Payer: Self-pay | Admitting: Adult Health

## 2018-07-19 ENCOUNTER — Inpatient Hospital Stay: Payer: 59 | Attending: Adult Health | Admitting: Adult Health

## 2018-07-19 DIAGNOSIS — Z923 Personal history of irradiation: Secondary | ICD-10-CM

## 2018-07-19 DIAGNOSIS — Z170 Estrogen receptor positive status [ER+]: Secondary | ICD-10-CM

## 2018-07-19 DIAGNOSIS — Z79810 Long term (current) use of selective estrogen receptor modulators (SERMs): Secondary | ICD-10-CM

## 2018-07-19 DIAGNOSIS — Z9221 Personal history of antineoplastic chemotherapy: Secondary | ICD-10-CM

## 2018-07-19 DIAGNOSIS — C50511 Malignant neoplasm of lower-outer quadrant of right female breast: Secondary | ICD-10-CM | POA: Diagnosis not present

## 2018-07-19 NOTE — Progress Notes (Signed)
SURVIVORSHIP VIRTUAL VISIT:  I connected with Katie Marquez on 07/19/18 at  2:00 PM EDT by [facility] and verified that I am speaking with the correct person using two identifiers.   I discussed the limitations, risks, security and privacy concerns of performing an evaluation and management service by telephone and the availability of in person appointments. I also discussed with the patient that there may be a patient responsible charge related to this service. The patient expressed understanding and agreed to proceed.   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     01/31/2017 - 12/2017 Adjuvant Chemotherapy   adjuvant chemotherapy consisting of paclitaxel weekly x12, together with trastuzumab every 21 days started 01/31/2017  trastuzumab continued to total 1 year (through December 2019)             (a) echocardiogram 01/17/2017 demonstrates a LVEF of 65-70%             (b) echocardiogram 04/19/2017 showed an ejection fraction in the 60-65% range             (c) echocardiogram 07/20/2017 showed an ejection fraction in the 55-60% range             (d) echocardiogram 10/26/2017 showed an ejection fraction in the 60-65% range    03/07/2017 Genetic Testing   Negative genetic testing on a 60 gene panel that reviewed genes associated with common hereditary cancer syndromes and renal cancer syndromes.  The Hereditary Gene Panel offered by [facility] includes sequencing and/or deletion duplication testing of the following 47 genes: APC, ATM, AXIN2, BARD1, BMPR1A,  BRCA1, BRCA2, BRIP1, CDH1, CDK4, CDKN2A (p14ARF), CDKN2A (p16INK4a), CHEK2, CTNNA1, DICER1, EPCAM (Deletion/duplication testing only), GREM1 (promoter region deletion/duplication testing only), KIT, MEN1, MLH1, MSH2, MSH3, MSH6, MUTYH, NBN, NF1, NHTL1, PALB2, PDGFRA, PMS2, POLD1, POLE, PTEN, RAD50, RAD51C, RAD51D, SDHB, SDHC, SDHD, SMAD4, SMARCA4. STK11, TP53, TSC1, TSC2, and VHL.  The following genes were evaluated for sequence changes only: SDHA and HOXB13 c.251G>A variant only. The Renal/Urinary Tract cancer panel offered by [facility] includes sequencing and/or deletion/duplication testing of the following 30 genes: BAP1, BUB1B, CDC73, CEP57, CDKN1C, DICER1, DIS3L2,  EPCAM, FH, FLCN, GPC3, MET, MITF, MLH1, MSH2, MSH6, PALB2, PMS2, PTEN, SDHA, SDHB, SDHC, SDHD, SMARCA4, SMARCB1, TP53, TSC1, TSC2, VHL, and WT1. The report date is March 07, 2017.    05/09/2017 - 06/06/2017 Radiation Therapy   Site/dose:40.05 Gy directed to the right breast in 15 fractions followed by boost of 10 Gy in 5 fractions   06/2017 -  Anti-estrogen oral therapy   Tamoxifen daily     INTERVAL HISTORY:  Katie Marquez to review her survivorship care plan detailing her treatment course for breast cancer, as well as monitoring long-term side effects of that treatment, education regarding health maintenance, screening, and overall wellness and health promotion.     Overall, Katie Marquez reports feeling quite well.  She is doing very well and is taking Tamoxifen daily.  She has intermittent hot flashes, but these were present before she started the Tamoxifen.  She denies any arthralgias/vaginal discharge.  She  notes her nails are more brittle. She has been trying biotin for this and started it a couple of months ago.   Katie Marquez notes she was having some tingling in her breast and down her arm. She was told that it was post surgical issues and not a sign of recurrence.   REVIEW OF SYSTEMS:  Review of Systems  Constitutional: Negative for  appetite change, chills, fatigue and fever.  HENT:   Negative for hearing loss, lump/mass and trouble swallowing.   Eyes: Negative for eye problems and icterus.  Respiratory: Negative for chest tightness, cough and shortness of breath.   Cardiovascular: Negative for chest pain, leg swelling and palpitations.  Gastrointestinal: Negative for abdominal distention, abdominal pain, constipation, diarrhea, nausea and vomiting.  Endocrine: Positive for hot flashes.  Genitourinary: Negative for difficulty urinating.   Musculoskeletal: Negative for arthralgias.  Skin: Negative for itching and rash.  Neurological: Negative for dizziness, extremity weakness, headaches and numbness.  Hematological: Negative for adenopathy. Does not bruise/bleed easily.  Psychiatric/Behavioral: Negative for depression. The patient is not nervous/anxious.   Breast: Denies any new nodularity, masses, tenderness, nipple changes, or nipple discharge.    ONCOLOGY TREATMENT TEAM:  1. Surgeon:  Dr. Dalbert Batman at [facility] 2. Medical Oncologist: Dr. Jana Hakim  3. Radiation Oncologist: Dr. Isidore Moos    PAST MEDICAL/SURGICAL HISTORY:  Past Medical History:  Diagnosis Date  . Anxiety   . Cancer [facility])    breast cancer  . Diverticulosis   . Family history of kidney cancer   . Family history of thyroid cancer   . Heart murmur   . History of radiation therapy 05/09/17- 06/06/17   40.05 Gy directed to the right breast in 15 fractions followed by boost of 10 Gy in 5 fractions.   . Hypertension   . IBS (irritable bowel syndrome)   . Iron deficiency   . Osteoarthritis   . Personal history of chemotherapy   . Personal history of radiation therapy   . PONV (postoperative nausea and vomiting)   . Tremors of nervous system    essential tremors   Past Surgical History:  Procedure Laterality Date  . BREAST LUMPECTOMY    . BREAST LUMPECTOMY WITH RADIOACTIVE SEED AND SENTINEL LYMPH NODE BIOPSY Right 01/08/2017    Procedure: RIGHT BREAST LUMPECTOMY WITH RADIOACTIVE SEED AND RIGHT SENTINEL LYMPH NODE BIOPSY ERAS PATHWAY;  Surgeon: Fanny Skates, MD;  Location: [place];  Service: General;  Laterality: Right;  PEC BLOCK  . LAPAROSCOPIC SIGMOID COLECTOMY    . LAPAROSCOPY    . PORTACATH PLACEMENT Left 01/08/2017   Procedure: INSERTION PORT-A-CATH WITH ULTRA SOUND;  Surgeon: Fanny Skates, MD;  Location: [place];  Service: General;  Laterality: Left;     ALLERGIES:  No Known Allergies   CURRENT MEDICATIONS:  Outpatient Encounter Medications as of 07/19/2018  Medication Sig  . CALCIUM PO Take by mouth daily.  . cholecalciferol (VITAMIN D) 1000 units tablet Take 2,000 Units by mouth daily.  Marland Kitchen FLUoxetine (PROZAC) 10 MG capsule Take 20 mg by mouth daily.   Marland Kitchen lisinopril (PRINIVIL,ZESTRIL) 10 MG tablet Take 1 tablet (10 mg total) by mouth daily. (Patient taking differently: Take 20 mg by mouth daily. )  . Probiotic Product (PROBIOTIC-10 PO) Take by mouth.  . Red Yeast Rice 600 MG CAPS Take by mouth.  . tamoxifen (NOLVADEX) 20 MG tablet Take 1 tablet (20 mg total) by mouth daily.  . [DISCONTINUED] prochlorperazine (COMPAZINE) 10 MG tablet Take 1 tablet (10 mg total) by  mouth every 6 (six) hours as needed (Nausea or vomiting).   No facility-administered encounter medications on file as of 07/19/2018.      ONCOLOGIC FAMILY HISTORY:  Family History  Problem Relation Age of Onset  . Rheum arthritis Mother   . Hyperlipidemia Mother   . Hypertension Mother   . Thyroid cancer Mother 88  . Diverticulitis Father   . Parkinson's disease Father   . Kidney cancer Father 69  . Multiple sclerosis Maternal Aunt   . Breast cancer Neg Hx      GENETIC COUNSELING/TESTING: See above  SOCIAL HISTORY:  Social History   Socioeconomic History  . Marital status: Married    Spouse name: Not on file  . Number of children: Not on file  . Years of education: Not on file  . Highest education level: Not on file   Occupational History  . Not on file  Social Needs  . Financial resource strain: Not on file  . Food insecurity    Worry: Not on file    Inability: Not on file  . Transportation needs    Medical: Not on file    Non-medical: Not on file  Tobacco Use  . Smoking status: Never Smoker  . Smokeless tobacco: Never Used  Substance and Sexual Activity  . Alcohol use: Yes    Frequency: Never    Comment: occas  . Drug use: No  . Sexual activity: Not on file  Lifestyle  . Physical activity    Days per week: Not on file    Minutes per session: Not on file  . Stress: Not on file  Relationships  . Social Herbalist on phone: Not on file    Gets together: Not on file    Attends religious service: Not on file    Active member of club or organization: Not on file    Attends meetings of clubs or organizations: Not on file    Relationship status: Not on file  . Intimate partner violence    Fear of current or ex partner: Not on file    Emotionally abused: Not on file    Physically abused: Not on file    Forced sexual activity: Not on file  Other Topics Concern  . Not on file  Social History Narrative  . Not on file     OBSERVATIONS/OBJECTIVE:   LABORATORY DATA:  None for this visit.  DIAGNOSTIC IMAGING:  None for this visit.      ASSESSMENT AND PLAN:  Katie Marquez is a pleasant 63 y.o. female with Stage IA right breast invasive ductal carcinoma, ER+/PR+/HER2-, diagnosed in 11/2016, treated with lumpectomy, adjuvant chemotherapy, maintenance trastuzumab, adjuvant radiation therapy, and anti-estrogen therapy with Tamoxifen beginning in 06/2017.  She presents to the Survivorship Clinic for our initial meeting and routine follow-up post-completion of treatment for breast cancer.    1. Stage IA right breast cancer:  Katie Marquez is continuing to recover from definitive treatment for breast cancer. She will follow-up with her medical oncologist, Dr. Jana Hakim in 10/2018 with  history and physical exam per surveillance protocol.  She will continue her anti-estrogen therapy with Tamoxifen. Thus far, she is tolerating the Tamoxifen well, with minimal side effects.  Her mammogram is due in 12/2018; orders placed today.  Her breast density is category D and she gets breast MRI alternating with mammograms. This was last done in June and was normal.   Today, a comprehensive survivorship care plan and treatment summary  was reviewed with the patient today detailing her breast cancer diagnosis, treatment course, potential late/long-term effects of treatment, appropriate follow-up care with recommendations for the future, and patient education resources.  A copy of this summary, along with a letter will be sent to the patient's primary care provider via mail/fax/In Basket message after today's visit.    2. Bone health:  Given Katie Marquez's age/history of breast cancer, she is at risk for bone demineralization.    She was given education on specific activities to promote bone health.  3. Cancer screening:  Due to Katie Marquez's history and her age, she should receive screening for skin cancers, colon cancer, and gynecologic cancers.  The information and recommendations are listed on the patient's comprehensive care plan/treatment summary and were reviewed in detail with the patient.    4. Health maintenance and wellness promotion: Katie Marquez was encouraged to consume 5-7 servings of fruits and vegetables per day. We reviewed the "Nutrition Rainbow" handout, as well as the handout "Take Control of Your Health and Reduce Your Cancer Risk" from the [employer].  She was also encouraged to engage in moderate to vigorous exercise for 30 minutes per day most days of the week. We discussed the [employer] fitness program, which is designed for cancer survivors to help them become more physically fit after cancer treatments.  She was instructed to limit her alcohol consumption and  continue to abstain from tobacco use.     5. Support services/counseling: It is not uncommon for this period of the patient's cancer care trajectory to be one of many emotions and stressors.  We discussed how this can be increasingly difficult during the times of quarantine and social distancing due to the COVID-19 pandemic.   She was given information regarding our available services and encouraged to contact me with any questions or for help enrolling in any of our support group/programs.    Follow up instructions:    -Return to cancer center for f/u with Dr. Jana Hakim in 10/2018  -Mammogram due in 12/2018 -Follow up with surgery in 12/2018 for f/u with Dr. Dalbert Batman -She is welcome to return back to the Survivorship Clinic at any time; no additional follow-up needed at this time.  -Consider referral back to survivorship as a long-term survivor for continued surveillance  The patient was provided an opportunity to ask questions and all were answered. The patient agreed with the plan and demonstrated an understanding of the instructions.   The patient was advised to call back or seek an in-person evaluation if the symptoms worsen or if the condition fails to improve as anticipated.   I provided 20 minutes of face-to-face video visit time during this encounter, and > 50% was spent counseling as documented under my assessment & plan.  Scot Dock, NP

## 2018-09-23 ENCOUNTER — Other Ambulatory Visit: Payer: Self-pay | Admitting: Oncology

## 2018-09-25 ENCOUNTER — Encounter: Payer: Self-pay | Admitting: Adult Health

## 2018-09-27 ENCOUNTER — Telehealth: Payer: Self-pay | Admitting: Adult Health

## 2018-09-27 NOTE — Telephone Encounter (Signed)
R/s appt per 8/27 sch message - pt is aware of appt date and time

## 2018-10-13 NOTE — Progress Notes (Signed)
[place]  [phone] [phone]     ID: Katie Marquez DOB: 08/30/1955  MR#: [id number]  HKV#:[id number]  Patient Care Team: Jonathon Jordan, MD as PCP - General (Family Medicine) Fanny Skates, MD as Consulting Physician (General Surgery) Dalinda Heidt, Virgie Dad, MD as Consulting Physician (Oncology) Richmond Campbell, MD as Consulting Physician (Gastroenterology) Eppie Gibson, MD as Attending Physician (Radiation Oncology) Lovey Newcomer, MD as Attending Physician (Radiology) Bensimhon, Shaune Pascal, MD as Consulting Physician (Cardiology) Molli Posey, MD as Consulting Physician (Obstetrics and Gynecology) OTHER MD:   CHIEF COMPLAINT: Triple positive breast cancer  CURRENT TREATMENT: tamoxifen   HISTORY OF CURRENT ILLNESS: From the original intake note:  Katie Marquez had bilateral screening mammography with tomography at the breast center 12/12/2016, showing a possible mass in the right breast.  Right breast diagnostic mammography with tomography and right breast ultrasonography 12/15/2016 found the breast density to be category D.  In the lower right breast there was a small lobulated mass which was not palpable.  Ultrasound showed a 0.8 cm mass in the right breast 7:00 radiant 2 cm from the nipple.  The right axilla was benign.  Biopsy of the right breast mass in question 12/15/2016 showed ([id number]) and invasive ductal carcinoma, grade 3, estrogen receptor at 95% positive, progesterone receptor 50% positive, both with strong staining intensity, with an MIB-1 of 15%, and HER-2 amplification, the signals ratio being 1.83 but the number per cell 10.23.  The patient's subsequent history is as detailed below.   INTERVAL HISTORY: Sheyli returns today for follow-up and treatment of her triple positive breast cancer. She was last seen here on 07/19/2018.   She also continues on tamoxifen.  She has occasional hot flashes and rare minimal vaginal  discharge.  Actually she has significant vaginal dryness problems.  Since her last visit here, she has not undergone any additional studies. She is up to date with mammography, which was last completed on 12/19/2017 at [facility]. She last underwent a breast MRI on 07/03/2018.     REVIEW OF SYSTEMS: Colby developed swelling of her right index finger.  This was an isolated arthropathy and she was evaluated by Dr. Amil Amen who obtained extensive lab work, scanned separately.  She is rheumatoid factor positive.  He started her on steroids and that is helping some.  She tells me her mother has a history of rheumatoid arthritis.  Aside from that she exercises on the treadmill 30 minutes a day and once the gym is open she plans to go back to swimming.  She and her husband are pretty much retired she says and they are keeping strict pandemic precautions.  A detailed review of systems today was otherwise stable.   PAST MEDICAL HISTORY: Past Medical History:  Diagnosis Date  . Anxiety   . Cancer [facility])    breast cancer  . Diverticulosis   . Family history of kidney cancer   . Family history of thyroid cancer   . Heart murmur   . History of radiation therapy 05/09/17- 06/06/17   40.05 Gy directed to the right breast in 15 fractions followed by boost of 10 Gy in 5 fractions.   . Hypertension   . IBS (irritable bowel syndrome)   . Iron deficiency   . Osteoarthritis   . Personal history of chemotherapy   . Personal history of radiation therapy   . PONV (postoperative nausea and vomiting)   . Tremors of nervous system    essential tremors  PAST SURGICAL HISTORY: Past Surgical History:  Procedure Laterality Date  . BREAST LUMPECTOMY    . BREAST LUMPECTOMY WITH RADIOACTIVE SEED AND SENTINEL LYMPH NODE BIOPSY Right 01/08/2017   Procedure: RIGHT BREAST LUMPECTOMY WITH RADIOACTIVE SEED AND RIGHT SENTINEL LYMPH NODE BIOPSY ERAS PATHWAY;  Surgeon: Fanny Skates, MD;  Location: [facility];   Service: General;  Laterality: Right;  PEC BLOCK  . LAPAROSCOPIC SIGMOID COLECTOMY    . LAPAROSCOPY    . PORTACATH PLACEMENT Left 01/08/2017   Procedure: INSERTION PORT-A-CATH WITH ULTRA SOUND;  Surgeon: Fanny Skates, MD;  Location[facility];  Service: General;  Laterality: Left;    FAMILY HISTORY Family History  Problem Relation Age of Onset  . Rheum arthritis Mother   . Hyperlipidemia Mother   . Hypertension Mother   . Thyroid cancer Mother 32  . Diverticulitis Father   . Parkinson's disease Father   . Kidney cancer Father 42  . Multiple sclerosis Maternal Aunt   . Breast cancer Neg Hx   Katie Marquez comes from an [place] background.  Her father died at 29 with Parkinson's disease and complications from tobacco abuse including a history of renal cancer; the patient's mother died at age 2, with a history of rheumatoid arthritis and thyroid cancer.  The patient had no brothers, 1 sister, in good health.  There is no history of breast or ovarian cancer in the family  GYNECOLOGIC HISTORY:  No LMP recorded. Patient is postmenopausal. Menarche age 31, first live birth age 66.  The patient is GX P1.  She had infertility treatments after her first live birth but they were not successful.  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  Their daughter [name] lives in [place] and previously attended [employer] and then the [employer] in business and psychology. The patient's daughter got married in September 2019, and her husband is in Engineer, mining.     ADVANCED DIRECTIVES:    HEALTH MAINTENANCE: Social History   Tobacco Use  . Smoking status: Never Smoker  . Smokeless tobacco: Never Used  Substance Use Topics  . Alcohol use: Yes    Frequency: Never    Comment: occas  . Drug use: No      Colonoscopy: [facility]  PAP: [facility]  Bone density: Osteopenia/ [name] around 2017   No Known Allergies  Current Outpatient Medications  Medication Sig Dispense Refill  . CALCIUM PO Take by mouth daily.    . cholecalciferol (VITAMIN D) 1000 units tablet Take 2,000 Units by mouth daily.    Marland Kitchen FLUoxetine (PROZAC) 10 MG capsule Take 20 mg by mouth daily.     Marland Kitchen lisinopril (PRINIVIL,ZESTRIL) 10 MG tablet Take 1 tablet (10 mg total) by mouth daily. (Patient taking differently: Take 20 mg by mouth daily. ) 30 tablet 3  . Probiotic Product (PROBIOTIC-10 PO) Take by mouth.    . Red Yeast Rice 600 MG CAPS Take by mouth.    . tamoxifen (NOLVADEX) 20 MG tablet TAKE 1 TABLET BY MOUTH  DAILY 90 tablet 0   No current facility-administered medications for this visit.     OBJECTIVE: Middle-aged white woman in no acute distress  Vitals:   10/14/18 1105  BP: 132/86  Pulse: 73  Resp: 17  Temp: 98.9 F (37.2 C)  SpO2: 98%   Wt  Readings from Last 3 Encounters:  10/14/18 120 lb 6.4 oz (54.6 kg)  02/08/18 121 lb (54.9 kg)  01/15/18 118 lb 9.6 oz (53.8 kg)   Body mass index is 22.02 kg/m.    ECOG FS:1 - Symptomatic but completely ambulatory  Ocular: Sclerae unicteric, pupils round and equal Ear-nose-throat: Wearing a mask Lymphatic: No cervical or supraclavicular adenopathy Lungs no rales or rhonchi Heart regular rate and rhythm Abd soft, nontender, positive bowel sounds MSK no focal spinal tenderness, no joint edema Neuro: non-focal, well-oriented, appropriate affect Breasts: The right breast is status post lumpectomy and radiation with no evidence of disease recurrence.  The left breast is benign.  Both axillae are benign.  LAB RESULTS:  CMP     Component Value Date/Time   NA 140 10/14/2018 1042   NA 141 01/31/2017 1225   K 4.3 10/14/2018 1042   K 4.1 01/31/2017 1225   CL 105 10/14/2018 1042   CO2 28 10/14/2018 1042   CO2 29 01/31/2017 1225   GLUCOSE 91 10/14/2018 1042    GLUCOSE 86 01/31/2017 1225   BUN 13 10/14/2018 1042   BUN 17.4 01/31/2017 1225   CREATININE 0.79 10/14/2018 1042   CREATININE 0.8 01/31/2017 1225   CALCIUM 8.9 10/14/2018 1042   CALCIUM 10.0 01/31/2017 1225   PROT 6.2 (L) 10/14/2018 1042   PROT 7.2 01/31/2017 1225   ALBUMIN 3.4 (L) 10/14/2018 1042   ALBUMIN 4.0 01/31/2017 1225   AST 14 (L) 10/14/2018 1042   AST 18 01/31/2017 1225   ALT 12 10/14/2018 1042   ALT 29 01/31/2017 1225   ALKPHOS 38 10/14/2018 1042   ALKPHOS 66 01/31/2017 1225   BILITOT 0.3 10/14/2018 1042   BILITOT 0.57 01/31/2017 1225   GFRNONAA >60 10/14/2018 1042   GFRAA >60 10/14/2018 1042    No results found for: TOTALPROTELP, ALBUMINELP, A1GS, A2GS, BETS, BETA2SER, GAMS, MSPIKE, SPEI  No results found for: Nils Pyle, [facility]  Lab Results  Component Value Date   WBC 6.7 10/14/2018   NEUTROABS 4.5 10/14/2018   HGB 14.2 10/14/2018   HCT 42.3 10/14/2018   MCV 89.1 10/14/2018   PLT 239 10/14/2018      Chemistry      Component Value Date/Time   NA 140 10/14/2018 1042   NA 141 01/31/2017 1225   K 4.3 10/14/2018 1042   K 4.1 01/31/2017 1225   CL 105 10/14/2018 1042   CO2 28 10/14/2018 1042   CO2 29 01/31/2017 1225   BUN 13 10/14/2018 1042   BUN 17.4 01/31/2017 1225   CREATININE 0.79 10/14/2018 1042   CREATININE 0.8 01/31/2017 1225      Component Value Date/Time   CALCIUM 8.9 10/14/2018 1042   CALCIUM 10.0 01/31/2017 1225   ALKPHOS 38 10/14/2018 1042   ALKPHOS 66 01/31/2017 1225   AST 14 (L) 10/14/2018 1042   AST 18 01/31/2017 1225   ALT 12 10/14/2018 1042   ALT 29 01/31/2017 1225   BILITOT 0.3 10/14/2018 1042   BILITOT 0.57 01/31/2017 1225       No results found for: LABCA2  No components found for: GEXBMW413  No results for input(s): INR in the last 168 hours.  No results found for: LABCA2  No results found for: KGM010  No results found for: UVO536  No results found for: UYQ034  No results found for:  CA2729  No components found for: HGQUANT  No results found for: CEA1 / No results found for: CEA1   No results  found for: AFPTUMOR  No results found for: [place]  No results found for: PSA1  Appointment on 10/14/2018  Component Date Value Ref Range Status  . Sodium 10/14/2018 140  135 - 145 mmol/L Final  . Potassium 10/14/2018 4.3  3.5 - 5.1 mmol/L Final  . Chloride 10/14/2018 105  98 - 111 mmol/L Final  . CO2 10/14/2018 28  22 - 32 mmol/L Final  . Glucose, Bld 10/14/2018 91  70 - 99 mg/dL Final  . BUN 10/14/2018 13  8 - 23 mg/dL Final  . Creatinine 10/14/2018 0.79  0.44 - 1.00 mg/dL Final  . Calcium 10/14/2018 8.9  8.9 - 10.3 mg/dL Final  . Total Protein 10/14/2018 6.2* 6.5 - 8.1 g/dL Final  . Albumin 10/14/2018 3.4* 3.5 - 5.0 g/dL Final  . AST 10/14/2018 14* 15 - 41 U/L Final  . ALT 10/14/2018 12  0 - 44 U/L Final  . Alkaline Phosphatase 10/14/2018 38  38 - 126 U/L Final  . Total Bilirubin 10/14/2018 0.3  0.3 - 1.2 mg/dL Final  . GFR, Est Non Af Am 10/14/2018 >60  >60 mL/min Final  . GFR, Est AFR Am 10/14/2018 >60  >60 mL/min Final  . Anion gap 10/14/2018 7  5 - 15 Final   Performed at [facility], [place] [address] [place], [place] [address]  . WBC Count 10/14/2018 6.7  4.0 - 10.5 K/uL Final  . RBC 10/14/2018 4.75  3.87 - 5.11 MIL/uL Final  . Hemoglobin 10/14/2018 14.2  12.0 - 15.0 g/dL Final  . HCT 10/14/2018 42.3  36.0 - 46.0 % Final  . MCV 10/14/2018 89.1  80.0 - 100.0 fL Final  . MCH 10/14/2018 29.9  26.0 - 34.0 pg Final  . MCHC 10/14/2018 33.6  30.0 - 36.0 g/dL Final  . RDW 10/14/2018 12.6  11.5 - 15.5 % Final  . Platelet Count 10/14/2018 239  150 - 400 K/uL Final  . nRBC 10/14/2018 0.0  0.0 - 0.2 % Final  . Neutrophils Relative % 10/14/2018 67  % Final  . Neutro Abs 10/14/2018 4.5  1.7 - 7.7 K/uL Final  . Lymphocytes Relative 10/14/2018 20  % Final  . Lymphs Abs 10/14/2018 1.3  0.7 - 4.0 K/uL Final  . Monocytes Relative 10/14/2018 9  %  Final  . Monocytes Absolute 10/14/2018 0.6  0.1 - 1.0 K/uL Final  . Eosinophils Relative 10/14/2018 3  % Final  . Eosinophils Absolute 10/14/2018 0.2  0.0 - 0.5 K/uL Final  . Basophils Relative 10/14/2018 1  % Final  . Basophils Absolute 10/14/2018 0.0  0.0 - 0.1 K/uL Final  . Immature Granulocytes 10/14/2018 0  % Final  . Abs Immature Granulocytes 10/14/2018 0.02  0.00 - 0.07 K/uL Final   Performed at [facility], [place] [address] [place], [place] [address]    (this displays the last labs from the last 3 days)  No results found for: TOTALPROTELP, ALBUMINELP, A1GS, A2GS, BETS, BETA2SER, GAMS, MSPIKE, SPEI (this displays SPEP labs)  No results found for: KPAFRELGTCHN, LAMBDASER, KAPLAMBRATIO (kappa/lambda light chains)  No results found for: HGBA, HGBA2QUANT, HGBFQUANT, HGBSQUAN (Hemoglobinopathy evaluation)   No results found for: LDH  No results found for: IRON, TIBC, IRONPCTSAT (Iron and TIBC)  No results found for: FERRITIN  Urinalysis No results found for: COLORURINE, APPEARANCEUR, LABSPEC, PHURINE, GLUCOSEU, HGBUR, BILIRUBINUR, KETONESUR, PROTEINUR, UROBILINOGEN, NITRITE, LEUKOCYTESUR   STUDIES: No results found.   ELIGIBLE FOR AVAILABLE RESEARCH PROTOCOL: no  ASSESSMENT: 63 y.o. [place] woman status post right breast lower outer quadrant biopsy 12/15/2016 for a clinical T1b N0, stage IA invasive ductal carcinoma, grade 3, triple positive, with an MIB-1 of 15%  (1) status post right lumpectomy 01/08/2017 for a  pT1c pN0, stage IA invasive ductal carcinoma, grade 2, with negative margins.    (2) adjuvant chemotherapy consisting of paclitaxel weekly x12, together with trastuzumab every 21 days started 01/31/2017  (3) trastuzumab continued to total 1 year (through December 2019)  (a) echocardiogram 01/17/2017 demonstrates a LVEF of 65-70%  (b) echocardiogram 04/19/2017 showed an ejection fraction in the 60-65% range  (c) echocardiogram  07/20/2017 showed an ejection fraction in the 55-60% range  (d) echocardiogram 10/26/2017 showed an ejection fraction in the 60-65% range  (4) adjuvant radiation completed 06/06/2017 Site/dose:   40.05 Gy directed to the right breast in 15 fractions followed by boost of 10 Gy in 5 fractions  (5) started tamoxifen 06/30/2017  (6) genetics testing completed on 02/20/2017 offered through [facility] showed: no deleterious mutations. The following genes were evaluated for sequence changes and exonic deletions/duplications: APC, ATM, AXIN2, BAP1, BARD1, BMPR1A, BRCA1, BRCA2, BRIP1, BUB1B, CDC73, CDH1, CDK4, CDKN1C, CDKN2A (p14ARF), CDKN2A (p16INK4a), CEP57, CHEK2, CTNNA1, DICER1, DIS3L2, EPCAM*, FH, FLCN, GPC3, GREM1*, KIT, MEN1, MET, MLH1, MSH2, MSH3, MSH6, MUTYH, NBN, NF1, PALB2, PDGFRA, PMS2, POLD1, POLE, PTEN, RAD50, RAD51C, RAD51D, SDHB,SDHC, SDHD, SMAD4, SMARCA4, SMARCB1, STK11, TP53, TSC1, TSC2, VHL, WT1. The following genes were evaluated for sequence changes only: HOXB13*, MITF*, NTHL1*, SDHA. Results are negative unless otherwise indicated  (7) intensified screening: Breast density category D, first live birth after age 67, Ashkenazi background (>20% lifetime)  (a) mammography November yearly  (b) breast MRI June yearly until breast density drops to B or lower  PLAN: Katie Marquez will soon be 2 years out from definitive surgery for her breast cancer, with no evidence of disease recurrence.  This is very favorable.  She is tolerating tamoxifen well and the plan is to continue that a minimum of 5 years.  Since she is on tamoxifen she may safely use vaginal estrogens.  We discussed this at length.  I went ahead and placed a prescription for Estring for her.  I also referred her to our pelvic health program through physical therapy  It is wonderful that she is now expecting a grandchild.  She will have mammography in November and repeat breast MRI June of next year.  If her breast density continues to  decrease, to a B level, we will discontinue the yearly MRI.  She knows to call for any other issue that may develop before the next visit.     Karagan Lehr, Virgie Dad, MD  10/14/18 11:25 AM Medical Oncology and Hematology [facility] [place], [place] [address] Tel. [phone]    Fax. [phone]   I, Jacqualyn Posey am acting as a Education administrator for Chauncey Cruel, MD.   I, Lurline Del MD, have reviewed the above documentation for accuracy and completeness, and I agree with the above.

## 2018-10-14 ENCOUNTER — Inpatient Hospital Stay: Payer: 59 | Attending: Adult Health

## 2018-10-14 ENCOUNTER — Inpatient Hospital Stay (HOSPITAL_BASED_OUTPATIENT_CLINIC_OR_DEPARTMENT_OTHER): Payer: 59 | Admitting: Oncology

## 2018-10-14 ENCOUNTER — Other Ambulatory Visit: Payer: Self-pay

## 2018-10-14 VITALS — BP 132/86 | HR 73 | Temp 98.9°F | Resp 17 | Ht 62.0 in | Wt 120.4 lb

## 2018-10-14 DIAGNOSIS — F419 Anxiety disorder, unspecified: Secondary | ICD-10-CM | POA: Insufficient documentation

## 2018-10-14 DIAGNOSIS — Z170 Estrogen receptor positive status [ER+]: Secondary | ICD-10-CM | POA: Insufficient documentation

## 2018-10-14 DIAGNOSIS — Z803 Family history of malignant neoplasm of breast: Secondary | ICD-10-CM | POA: Diagnosis not present

## 2018-10-14 DIAGNOSIS — M1990 Unspecified osteoarthritis, unspecified site: Secondary | ICD-10-CM | POA: Diagnosis not present

## 2018-10-14 DIAGNOSIS — R232 Flushing: Secondary | ICD-10-CM | POA: Diagnosis not present

## 2018-10-14 DIAGNOSIS — Z79899 Other long term (current) drug therapy: Secondary | ICD-10-CM | POA: Insufficient documentation

## 2018-10-14 DIAGNOSIS — I10 Essential (primary) hypertension: Secondary | ICD-10-CM | POA: Diagnosis not present

## 2018-10-14 DIAGNOSIS — R768 Other specified abnormal immunological findings in serum: Secondary | ICD-10-CM | POA: Diagnosis not present

## 2018-10-14 DIAGNOSIS — C50511 Malignant neoplasm of lower-outer quadrant of right female breast: Secondary | ICD-10-CM | POA: Insufficient documentation

## 2018-10-14 DIAGNOSIS — Z79810 Long term (current) use of selective estrogen receptor modulators (SERMs): Secondary | ICD-10-CM | POA: Insufficient documentation

## 2018-10-14 DIAGNOSIS — Z7982 Long term (current) use of aspirin: Secondary | ICD-10-CM | POA: Diagnosis not present

## 2018-10-14 DIAGNOSIS — Z923 Personal history of irradiation: Secondary | ICD-10-CM | POA: Insufficient documentation

## 2018-10-14 LAB — CBC WITH DIFFERENTIAL (CANCER CENTER ONLY)
Abs Immature Granulocytes: 0.02 10*3/uL (ref 0.00–0.07)
Basophils Absolute: 0 10*3/uL (ref 0.0–0.1)
Basophils Relative: 1 %
Eosinophils Absolute: 0.2 10*3/uL (ref 0.0–0.5)
Eosinophils Relative: 3 %
HCT: 42.3 % (ref 36.0–46.0)
Hemoglobin: 14.2 g/dL (ref 12.0–15.0)
Immature Granulocytes: 0 %
Lymphocytes Relative: 20 %
Lymphs Abs: 1.3 10*3/uL (ref 0.7–4.0)
MCH: 29.9 pg (ref 26.0–34.0)
MCHC: 33.6 g/dL (ref 30.0–36.0)
MCV: 89.1 fL (ref 80.0–100.0)
Monocytes Absolute: 0.6 10*3/uL (ref 0.1–1.0)
Monocytes Relative: 9 %
Neutro Abs: 4.5 10*3/uL (ref 1.7–7.7)
Neutrophils Relative %: 67 %
Platelet Count: 239 10*3/uL (ref 150–400)
RBC: 4.75 MIL/uL (ref 3.87–5.11)
RDW: 12.6 % (ref 11.5–15.5)
WBC Count: 6.7 10*3/uL (ref 4.0–10.5)
nRBC: 0 % (ref 0.0–0.2)

## 2018-10-14 LAB — CMP (CANCER CENTER ONLY)
ALT: 12 U/L (ref 0–44)
AST: 14 U/L — ABNORMAL LOW (ref 15–41)
Albumin: 3.4 g/dL — ABNORMAL LOW (ref 3.5–5.0)
Alkaline Phosphatase: 38 U/L (ref 38–126)
Anion gap: 7 (ref 5–15)
BUN: 13 mg/dL (ref 8–23)
CO2: 28 mmol/L (ref 22–32)
Calcium: 8.9 mg/dL (ref 8.9–10.3)
Chloride: 105 mmol/L (ref 98–111)
Creatinine: 0.79 mg/dL (ref 0.44–1.00)
GFR, Est AFR Am: >60 mL/min (ref >60)
GFR, Estimated: >60 mL/min (ref >60)
Glucose, Bld: 91 mg/dL (ref 70–99)
Potassium: 4.3 mmol/L (ref 3.5–5.1)
Sodium: 140 mmol/L (ref 135–145)
Total Bilirubin: 0.3 mg/dL (ref 0.3–1.2)
Total Protein: 6.2 g/dL — ABNORMAL LOW (ref 6.5–8.1)

## 2018-10-14 MED ORDER — TAMOXIFEN CITRATE 20 MG PO TABS: 20.0000 mg | ORAL_TABLET | Freq: Every day | ORAL | 4 refills | Status: DC

## 2018-10-14 MED ORDER — ESTRING 2 MG VA RING: 2.0000 mg | VAGINAL_RING | VAGINAL | 12 refills | Status: DC

## 2018-10-15 ENCOUNTER — Telehealth: Payer: Self-pay | Admitting: Oncology

## 2018-10-15 NOTE — Telephone Encounter (Signed)
I talk with patient regarding schedule  

## 2018-10-21 ENCOUNTER — Ambulatory Visit: Payer: 59 | Admitting: Oncology

## 2018-10-21 ENCOUNTER — Other Ambulatory Visit: Payer: 59

## 2018-12-23 ENCOUNTER — Other Ambulatory Visit: Payer: Self-pay

## 2018-12-23 ENCOUNTER — Ambulatory Visit
Admission: RE | Admit: 2018-12-23 | Discharge: 2018-12-23 | Disposition: A | Discharge status: Home / Self Care | Payer: Medicare Other | Source: Ambulatory Visit | Attending: Oncology | Admitting: Oncology

## 2018-12-23 DIAGNOSIS — C50511 Malignant neoplasm of lower-outer quadrant of right female breast: Secondary | ICD-10-CM

## 2018-12-23 DIAGNOSIS — Z170 Estrogen receptor positive status [ER+]: Secondary | ICD-10-CM

## 2019-02-28 ENCOUNTER — Ambulatory Visit: Payer: Medicare Other

## 2019-03-08 ENCOUNTER — Ambulatory Visit: Payer: Medicare Other

## 2019-03-17 ENCOUNTER — Ambulatory Visit: Payer: Medicare Other

## 2019-05-19 ENCOUNTER — Telehealth: Payer: Self-pay | Admitting: *Deleted

## 2019-05-19 NOTE — Telephone Encounter (Signed)
This RN spoke with pt per her inquiry about obtaining her yearly MRI for high risk early due to " we will be out of town for the summer this year ".  Pt is due for MRI of breast in mid June.  This RN informed her insurance likely will not authorize early ( 2 months early ) unless she has an active problem but ok to upon return ( September ) .  Katie Marquez verbalized understanding.  This RN will send request per above.

## 2019-09-17 ENCOUNTER — Telehealth: Payer: Self-pay | Admitting: Oncology

## 2019-09-17 NOTE — Telephone Encounter (Signed)
Rescheduled appts per provider PAL. Left voicemail with cancellation and new appt details.

## 2019-09-26 ENCOUNTER — Telehealth: Payer: Self-pay | Admitting: Oncology

## 2019-09-26 NOTE — Telephone Encounter (Signed)
Rescheduled appts 9/2 visit. LC is out of the office. Pt confirmed new appt date and time.

## 2019-10-01 ENCOUNTER — Ambulatory Visit: Payer: Medicare Other

## 2019-10-02 ENCOUNTER — Inpatient Hospital Stay: Payer: Medicare Other

## 2019-10-02 ENCOUNTER — Inpatient Hospital Stay: Payer: Medicare Other | Admitting: Adult Health

## 2019-10-09 ENCOUNTER — Ambulatory Visit: Payer: Self-pay | Admitting: Oncology

## 2019-10-09 ENCOUNTER — Other Ambulatory Visit: Payer: Medicare Other

## 2019-10-10 ENCOUNTER — Ambulatory Visit: Payer: Self-pay | Admitting: Adult Health

## 2019-10-10 ENCOUNTER — Other Ambulatory Visit: Payer: Medicare Other

## 2019-10-14 ENCOUNTER — Other Ambulatory Visit: Payer: 59

## 2019-10-14 ENCOUNTER — Ambulatory Visit: Payer: 59 | Admitting: Oncology

## 2019-10-20 ENCOUNTER — Ambulatory Visit
Admission: RE | Admit: 2019-10-20 | Discharge: 2019-10-20 | Disposition: A | Discharge status: Home / Self Care | Payer: Medicare Other | Source: Ambulatory Visit | Attending: Oncology | Admitting: Oncology

## 2019-10-20 ENCOUNTER — Other Ambulatory Visit: Payer: Self-pay

## 2019-10-20 DIAGNOSIS — C50511 Malignant neoplasm of lower-outer quadrant of right female breast: Secondary | ICD-10-CM

## 2019-10-20 DIAGNOSIS — Z170 Estrogen receptor positive status [ER+]: Secondary | ICD-10-CM

## 2019-10-20 MED ORDER — GADOBENATE DIMEGLUMINE 529 MG/ML IV SOLN
11.0000 mL | Freq: Once | INTRAVENOUS | Status: AC | PRN
  Administered 2019-10-20: 11 mL via INTRAVENOUS

## 2019-10-25 IMAGING — US US BREAST BX W LOC DEV 1ST LESION IMG BX SPEC US GUIDE*R*
1 series · 5 of 5 positions shown · non-contrast
Comparison: Previous exam(s).

ADDENDUM:
Pathology revealed grade III invasive ductal carcinoma and ductal
carcinoma in situ in the RIGHT BREAST. This was found to be
concordant by Dr. Kiri Jim. Pathology results were discussed with
the patient by telephone. The patient reported doing well after the
biopsy. Post biopsy instructions and care were reviewed and
questions were answered. The patient was encouraged to call The
the request of the patient, surgical consultation has been arranged
with Dr. Oklm Gurbuz at [REDACTED] on
December 26, 2016.

Pathology results reported by Daria Boller RN, BSN on 12/19/2016.
CLINICAL DATA: Patient with indeterminate right breast mass.
EXAM:
ULTRASOUND GUIDED RIGHT BREAST CORE NEEDLE BIOPSY

[Series 1: us breast bx w loc dev 1st lesion img bx spec us g · 0.05mm/px · 5 of 5 slices shown]
[im 1/5]
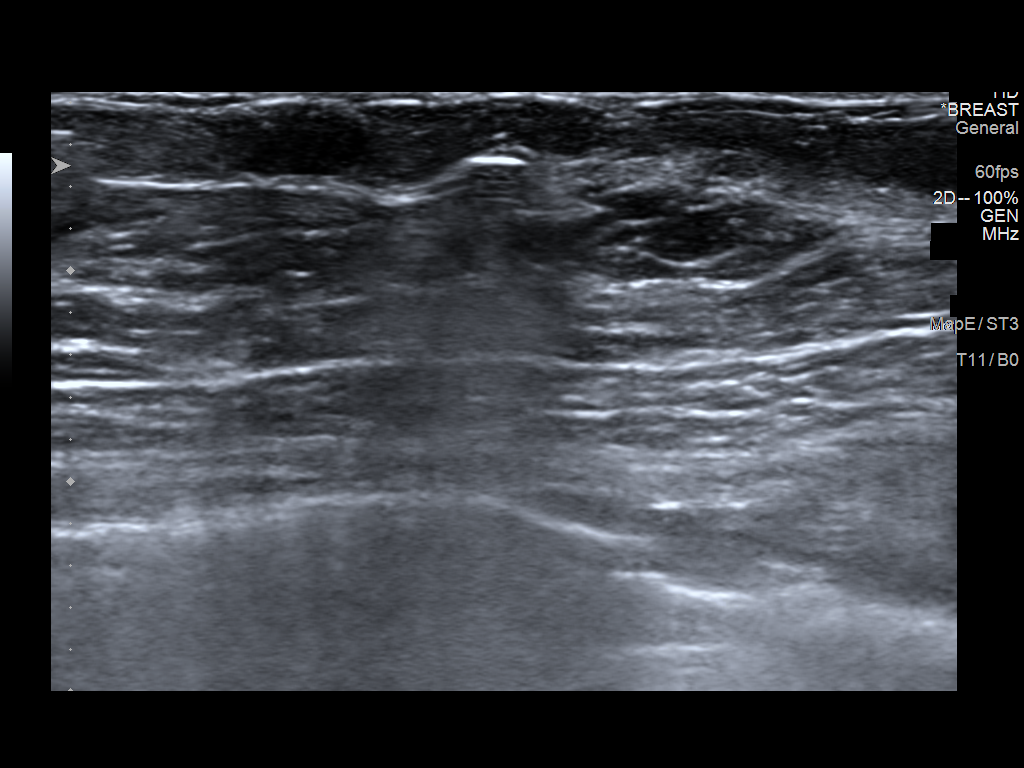
[im 2/5]
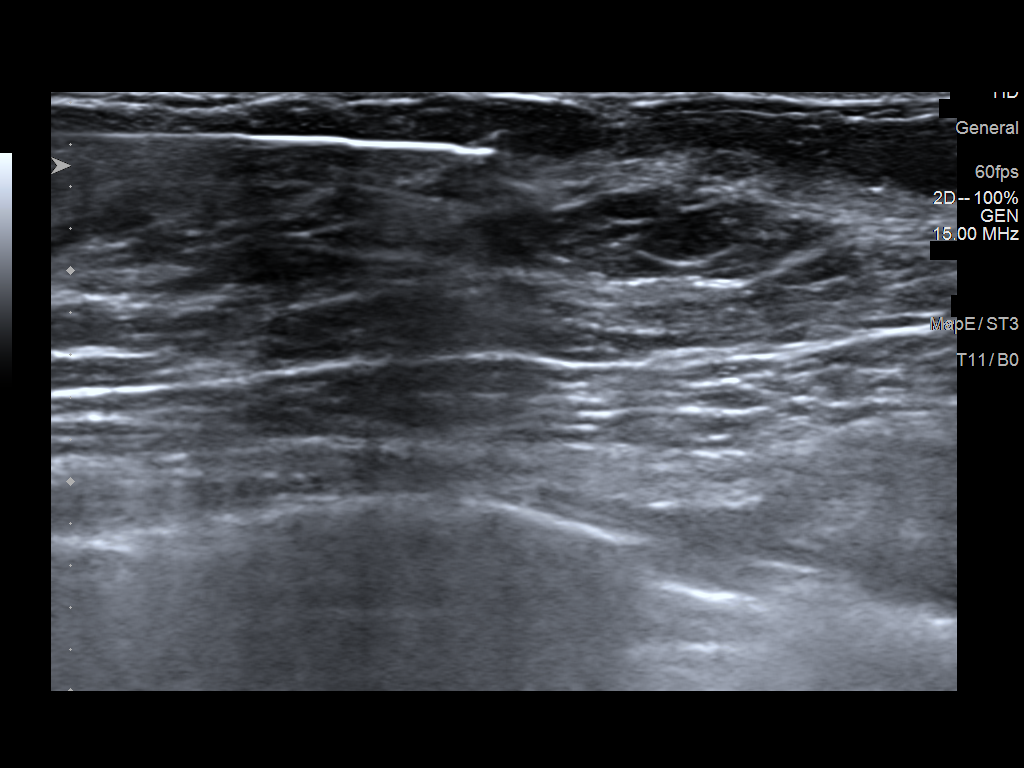
[im 3/5]
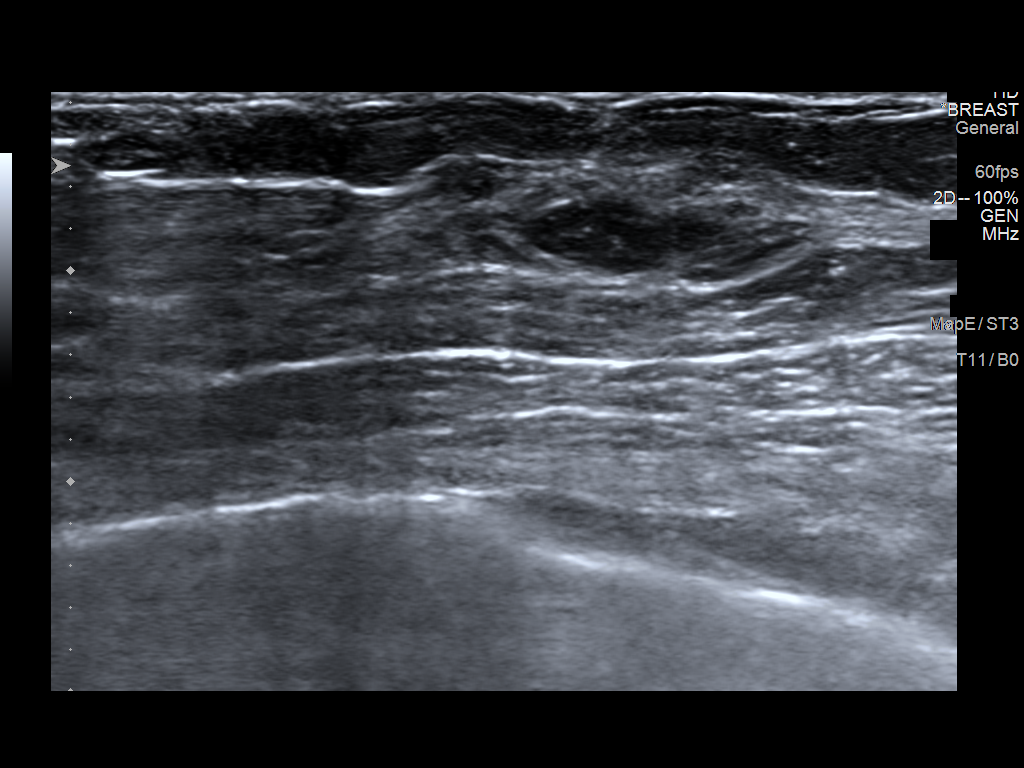
[im 4/5]
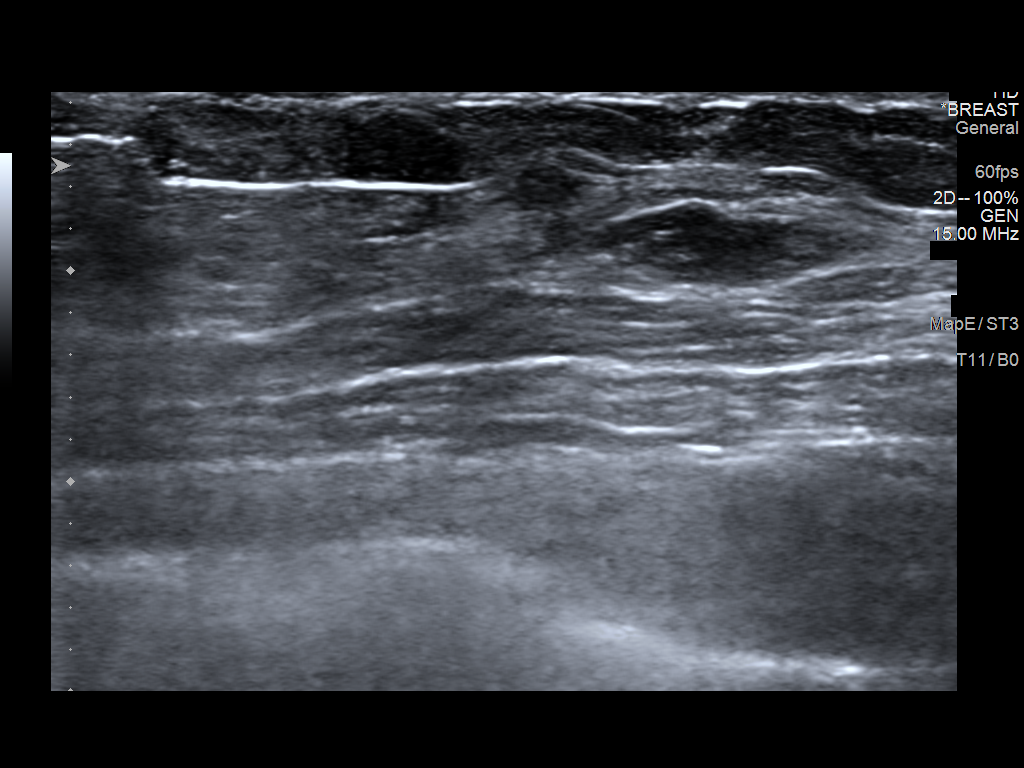
[im 5/5]
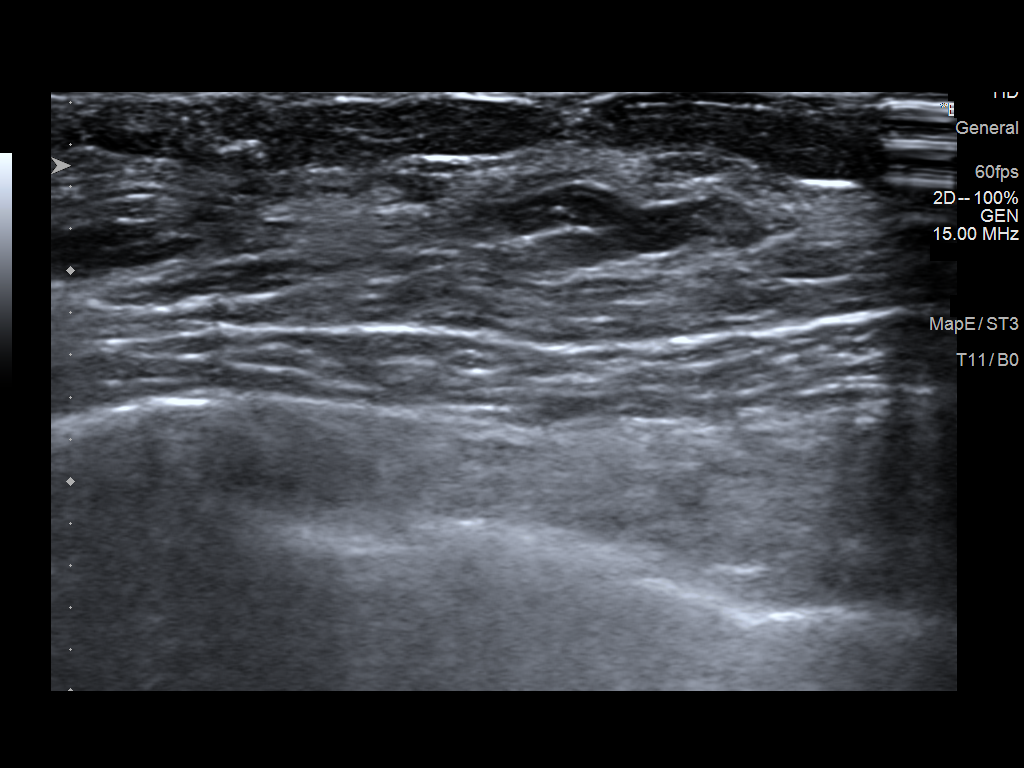

[5 of 5 positions shown; findings below may reference images not displayed]



Lesion quadrant: Lower outer quadrant

Using sterile technique and 1% Lidocaine as local anesthetic, under
direct ultrasound visualization, a 12 gauge Shanice device was
used to perform biopsy of right breast mass 7 o'clock position using
a lateral approach. At the conclusion of the procedure a ribbon
shaped tissue marker clip was deployed into the biopsy cavity.
Follow up 2 view mammogram was performed and dictated separately.
IMPRESSION: Ultrasound guided biopsy of right breast mass. No apparent
complications.

## 2019-10-27 ENCOUNTER — Other Ambulatory Visit: Payer: Self-pay | Admitting: *Deleted

## 2019-10-27 DIAGNOSIS — Z170 Estrogen receptor positive status [ER+]: Secondary | ICD-10-CM

## 2019-10-27 DIAGNOSIS — C50511 Malignant neoplasm of lower-outer quadrant of right female breast: Secondary | ICD-10-CM

## 2019-10-28 ENCOUNTER — Inpatient Hospital Stay (HOSPITAL_BASED_OUTPATIENT_CLINIC_OR_DEPARTMENT_OTHER): Payer: Medicare Other | Admitting: Oncology

## 2019-10-28 ENCOUNTER — Other Ambulatory Visit: Payer: Self-pay

## 2019-10-28 ENCOUNTER — Inpatient Hospital Stay: Payer: Medicare Other | Attending: Oncology

## 2019-10-28 VITALS — BP 121/76 | HR 69 | Temp 97.7°F | Resp 18 | Ht 62.0 in | Wt 119.0 lb

## 2019-10-28 DIAGNOSIS — C50511 Malignant neoplasm of lower-outer quadrant of right female breast: Secondary | ICD-10-CM | POA: Diagnosis present

## 2019-10-28 DIAGNOSIS — M1990 Unspecified osteoarthritis, unspecified site: Secondary | ICD-10-CM | POA: Insufficient documentation

## 2019-10-28 DIAGNOSIS — R768 Other specified abnormal immunological findings in serum: Secondary | ICD-10-CM | POA: Diagnosis not present

## 2019-10-28 DIAGNOSIS — Z170 Estrogen receptor positive status [ER+]: Secondary | ICD-10-CM | POA: Diagnosis not present

## 2019-10-28 DIAGNOSIS — Z79810 Long term (current) use of selective estrogen receptor modulators (SERMs): Secondary | ICD-10-CM | POA: Insufficient documentation

## 2019-10-28 DIAGNOSIS — F419 Anxiety disorder, unspecified: Secondary | ICD-10-CM | POA: Insufficient documentation

## 2019-10-28 DIAGNOSIS — Z79899 Other long term (current) drug therapy: Secondary | ICD-10-CM | POA: Diagnosis not present

## 2019-10-28 DIAGNOSIS — I10 Essential (primary) hypertension: Secondary | ICD-10-CM | POA: Insufficient documentation

## 2019-10-28 DIAGNOSIS — Z923 Personal history of irradiation: Secondary | ICD-10-CM | POA: Insufficient documentation

## 2019-10-28 DIAGNOSIS — R232 Flushing: Secondary | ICD-10-CM | POA: Insufficient documentation

## 2019-10-28 DIAGNOSIS — Z9221 Personal history of antineoplastic chemotherapy: Secondary | ICD-10-CM | POA: Insufficient documentation

## 2019-10-28 LAB — CMP (CANCER CENTER ONLY)
ALT: 13 U/L (ref 0–44)
AST: 15 U/L (ref 15–41)
Albumin: 3.6 g/dL (ref 3.5–5.0)
Alkaline Phosphatase: 45 U/L (ref 38–126)
Anion gap: 4 — ABNORMAL LOW (ref 5–15)
BUN: 16 mg/dL (ref 8–23)
CO2: 30 mmol/L (ref 22–32)
Calcium: 9.3 mg/dL (ref 8.9–10.3)
Chloride: 103 mmol/L (ref 98–111)
Creatinine: 0.8 mg/dL (ref 0.44–1.00)
GFR, Est AFR Am: >60 mL/min (ref >60)
GFR, Estimated: >60 mL/min (ref >60)
Glucose, Bld: 83 mg/dL (ref 70–99)
Potassium: 4.4 mmol/L (ref 3.5–5.1)
Sodium: 137 mmol/L (ref 135–145)
Total Bilirubin: 0.4 mg/dL (ref 0.3–1.2)
Total Protein: 6.8 g/dL (ref 6.5–8.1)

## 2019-10-28 LAB — CBC WITH DIFFERENTIAL (CANCER CENTER ONLY)
Abs Immature Granulocytes: 0.01 10*3/uL (ref 0.00–0.07)
Basophils Absolute: 0.1 10*3/uL (ref 0.0–0.1)
Basophils Relative: 1 %
Eosinophils Absolute: 0.2 10*3/uL (ref 0.0–0.5)
Eosinophils Relative: 2 %
HCT: 41.4 % (ref 36.0–46.0)
Hemoglobin: 13.8 g/dL (ref 12.0–15.0)
Immature Granulocytes: 0 %
Lymphocytes Relative: 24 %
Lymphs Abs: 1.7 10*3/uL (ref 0.7–4.0)
MCH: 28.8 pg (ref 26.0–34.0)
MCHC: 33.3 g/dL (ref 30.0–36.0)
MCV: 86.4 fL (ref 80.0–100.0)
Monocytes Absolute: 0.6 10*3/uL (ref 0.1–1.0)
Monocytes Relative: 8 %
Neutro Abs: 4.5 10*3/uL (ref 1.7–7.7)
Neutrophils Relative %: 65 %
Platelet Count: 296 10*3/uL (ref 150–400)
RBC: 4.79 MIL/uL (ref 3.87–5.11)
RDW: 12.3 % (ref 11.5–15.5)
WBC Count: 7.1 10*3/uL (ref 4.0–10.5)
nRBC: 0 % (ref 0.0–0.2)

## 2019-10-28 NOTE — Progress Notes (Signed)
[place]  [phone] Fax:([phone]) [phone]     ID: Katie Marquez DOB: [birth year]  MR#: [id number]  FIE#:[id number]  Patient Care Team: Jonathon Jordan, MD as PCP - General (Family Medicine) Fanny Skates, MD as Consulting Physician (General Surgery) Greenlee Ancheta, Virgie Dad, MD as Consulting Physician (Oncology) Richmond Campbell, MD as Consulting Physician (Gastroenterology) Eppie Gibson, MD as Attending Physician (Radiation Oncology) Lovey Newcomer, MD as Attending Physician (Radiology) Bensimhon, Shaune Pascal, MD as Consulting Physician (Cardiology) Molli Posey, MD as Consulting Physician (Obstetrics and Gynecology) Hennie Duos, MD as Consulting Physician (Rheumatology) OTHER MD:   CHIEF COMPLAINT: Triple positive breast cancer  CURRENT TREATMENT: tamoxifen   INTERVAL HISTORY: Katie Marquez returns today for follow-up of her triple positive breast cancer.   She continues on tamoxifen.  Hot flashes are not a major issue.  She has some vaginal dryness issue which is not due to tamoxifen and for which she uses lubricants.  She does have some hair loss and uses Rogaine for that.  This may be exacerbated by tamoxifen or of course could be simply due to menopause.  She finds that if she stops Rogaine for a few days the hair loss accelerates.  Since her last visit, she underwent bilateral diagnostic mammography with tomography at [facility] on 12/23/2018 showing: breast density category C; no evidence of malignancy in either breast.  She also underwent breast MRI on 10/20/2019 showing: breast density C; no evidence of malignancy in either breast.   REVIEW OF SYSTEMS: Josselyn now has a granddaughter in [place] which is the reason her MRI was a few months late.  She had the Pfizer vaccine x2 and she also had the booster shot.  She tolerated all that well.  She has some resting tremor issues and she is going to go to [facility] for therapy.  She walks 30  minutes every day.  Aside from these issues a detailed review of systems was stable   HISTORY OF CURRENT ILLNESS: From the original intake note:  Katie Marquez had bilateral screening mammography with tomography at the breast center 12/12/2016, showing a possible mass in the right breast.  Right breast diagnostic mammography with tomography and right breast ultrasonography 12/15/2016 found the breast density to be category D.  In the lower right breast there was a small lobulated mass which was not palpable.  Ultrasound showed a 0.8 cm mass in the right breast 7:00 radiant 2 cm from the nipple.  The right axilla was benign.  Biopsy of the right breast mass in question 12/15/2016 showed ([id number]) and invasive ductal carcinoma, grade 3, estrogen receptor at 95% positive, progesterone receptor 50% positive, both with strong staining intensity, with an MIB-1 of 15%, and HER-2 amplification, the signals ratio being 1.83 but the number per cell 10.23.  The patient's subsequent history is as detailed below.   PAST MEDICAL HISTORY: Past Medical History:  Diagnosis Date  . Anxiety   . Cancer [facility])    breast cancer  . Diverticulosis   . Family history of kidney cancer   . Family history of thyroid cancer   . Heart murmur   . History of radiation therapy 05/09/17- 06/06/17   40.05 Gy directed to the right breast in 15 fractions followed by boost of 10 Gy in 5 fractions.   . Hypertension   . IBS (irritable bowel syndrome)   . Iron deficiency   . Osteoarthritis   . Personal history of chemotherapy   . Personal history of  radiation therapy   . PONV (postoperative nausea and vomiting)   . Tremors of nervous system    essential tremors    PAST SURGICAL HISTORY: Past Surgical History:  Procedure Laterality Date  . BREAST LUMPECTOMY    . BREAST LUMPECTOMY WITH RADIOACTIVE SEED AND SENTINEL LYMPH NODE BIOPSY Right 01/08/2017   Procedure: RIGHT BREAST LUMPECTOMY WITH RADIOACTIVE SEED AND RIGHT SENTINEL  LYMPH NODE BIOPSY ERAS PATHWAY;  Surgeon: Fanny Skates, MD;  Location: [place];  Service: General;  Laterality: Right;  PEC BLOCK  . LAPAROSCOPIC SIGMOID COLECTOMY    . LAPAROSCOPY    . PORTACATH PLACEMENT Left 01/08/2017   Procedure: INSERTION PORT-A-CATH WITH ULTRA SOUND;  Surgeon: Fanny Skates, MD;  Location: [facility] OR;  Service: General;  Laterality: Left;    FAMILY HISTORY Family History  Problem Relation Age of Onset  . Rheum arthritis Mother   . Hyperlipidemia Mother   . Hypertension Mother   . Thyroid cancer Mother 71  . Diverticulitis Father   . Parkinson's disease Father   . Kidney cancer Father 43  . Multiple sclerosis Maternal Aunt   . Breast cancer Neg Hx   Katie Marquez comes from an [place] background.  Her father died at 33 with Parkinson's disease and complications from tobacco abuse including a history of renal cancer; the patient's mother died at age 19, with a history of rheumatoid arthritis and thyroid cancer.  The patient had no brothers, 1 sister, in good health.  There is no history of breast or ovarian cancer in the family   GYNECOLOGIC HISTORY:  No LMP recorded. Patient is postmenopausal. Menarche age 12, first live birth age 8.  The patient is GX P1.  She had infertility treatments after her first live birth but they were not successful.  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  Their daughter [name] lives in [place] and previously attended [employer] and then [employer] in business and psychology. The patient's daughter got married in September 2019, and her husband is in Engineer, mining.    ADVANCED DIRECTIVES: In the absence of any documentation to the contrary, the patient's spouse is their HCPOA.    HEALTH MAINTENANCE: Social History    Tobacco Use  . Smoking status: Never Smoker  . Smokeless tobacco: Never Used  Vaping Use  . Vaping Use: Never used  Substance Use Topics  . Alcohol use: Yes    Comment: occas  . Drug use: No     Colonoscopy: [facility]  PAP: [facility]  Bone density: Osteopenia/ [name] around 2017   No Known Allergies  Current Outpatient Medications  Medication Sig Dispense Refill  . CALCIUM PO Take by mouth daily.    . cholecalciferol (VITAMIN D) 1000 units tablet Take 2,000 Units by mouth daily.    Marland Kitchen estradiol (ESTRING) 2 MG vaginal ring Place 2 mg vaginally every 3 (three) months. follow package directions 1 each 12  . FLUoxetine (PROZAC) 10 MG capsule Take 20 mg by mouth daily.     Marland Kitchen lisinopril (PRINIVIL,ZESTRIL) 10 MG tablet Take 1 tablet (10 mg total) by mouth daily. (Patient taking differently: Take 20 mg by mouth daily. ) 30 tablet 3  . Probiotic Product (PROBIOTIC-10 PO) Take by mouth.    . Red Yeast Rice 600 MG CAPS Take by mouth.    Marland Kitchen  tamoxifen (NOLVADEX) 20 MG tablet Take 1 tablet (20 mg total) by mouth daily. 90 tablet 4   No current facility-administered medications for this visit.    OBJECTIVE: White woman who appears younger than stated age  Vitals:   10/28/19 1216  BP: 121/76  Pulse: 69  Resp: 18  Temp: 97.7 F (36.5 C)  SpO2: 96%   Wt Readings from Last 3 Encounters:  10/28/19 119 lb (54 kg)  10/14/18 120 lb 6.4 oz (54.6 kg)  02/08/18 121 lb (54.9 kg)   Body mass index is 21.77 kg/m.    ECOG FS:1 - Symptomatic but completely ambulatory  Sclerae unicteric, EOMs intact Wearing a mask No cervical or supraclavicular adenopathy Lungs no rales or rhonchi Heart regular rate and rhythm Abd soft, nontender, positive bowel sounds MSK no focal spinal tenderness, no upper extremity lymphedema Neuro: nonfocal, well oriented, appropriate affect Breasts: The right breast has undergone lumpectomy and radiation.  The cosmetic result is good.  There is no evidence of  disease recurrence.  Left breast is benign.  Both axillae are benign.   LAB RESULTS:  CMP     Component Value Date/Time   NA 137 10/28/2019 1203   NA 141 01/31/2017 1225   K 4.4 10/28/2019 1203   K 4.1 01/31/2017 1225   CL 103 10/28/2019 1203   CO2 30 10/28/2019 1203   CO2 29 01/31/2017 1225   GLUCOSE 83 10/28/2019 1203   GLUCOSE 86 01/31/2017 1225   BUN 16 10/28/2019 1203   BUN 17.4 01/31/2017 1225   CREATININE 0.80 10/28/2019 1203   CREATININE 0.8 01/31/2017 1225   CALCIUM 9.3 10/28/2019 1203   CALCIUM 10.0 01/31/2017 1225   PROT 6.8 10/28/2019 1203   PROT 7.2 01/31/2017 1225   ALBUMIN 3.6 10/28/2019 1203   ALBUMIN 4.0 01/31/2017 1225   AST 15 10/28/2019 1203   AST 18 01/31/2017 1225   ALT 13 10/28/2019 1203   ALT 29 01/31/2017 1225   ALKPHOS 45 10/28/2019 1203   ALKPHOS 66 01/31/2017 1225   BILITOT 0.4 10/28/2019 1203   BILITOT 0.57 01/31/2017 1225   GFRNONAA >60 10/28/2019 1203   GFRAA >60 10/28/2019 1203    No results found for: TOTALPROTELP, ALBUMINELP, A1GS, A2GS, BETS, BETA2SER, GAMS, MSPIKE, SPEI  No results found for: Ron Parker, [facility]  Lab Results  Component Value Date   WBC 7.1 10/28/2019   NEUTROABS 4.5 10/28/2019   HGB 13.8 10/28/2019   HCT 41.4 10/28/2019   MCV 86.4 10/28/2019   PLT 296 10/28/2019      Chemistry      Component Value Date/Time   NA 137 10/28/2019 1203   NA 141 01/31/2017 1225   K 4.4 10/28/2019 1203   K 4.1 01/31/2017 1225   CL 103 10/28/2019 1203   CO2 30 10/28/2019 1203   CO2 29 01/31/2017 1225   BUN 16 10/28/2019 1203   BUN 17.4 01/31/2017 1225   CREATININE 0.80 10/28/2019 1203   CREATININE 0.8 01/31/2017 1225      Component Value Date/Time   CALCIUM 9.3 10/28/2019 1203   CALCIUM 10.0 01/31/2017 1225   ALKPHOS 45 10/28/2019 1203   ALKPHOS 66 01/31/2017 1225   AST 15 10/28/2019 1203   AST 18 01/31/2017 1225   ALT 13 10/28/2019 1203   ALT 29 01/31/2017 1225   BILITOT 0.4 10/28/2019 1203    BILITOT 0.57 01/31/2017 1225       No results found for: LABCA2  No components found for: YOXNRC248  No results for input(s): INR in the last 168 hours.  No results found for: LABCA2  No results found for: IOE703  No results found for: JKK938  No results found for: HWE993  No results found for: CA2729  No components found for: HGQUANT  No results found for: CEA1 / No results found for: CEA1   No results found for: AFPTUMOR  No results found for: CHROMOGRNA  No results found for: HGBA, HGBA2QUANT, HGBFQUANT, HGBSQUAN (Hemoglobinopathy evaluation)   No results found for: LDH  No results found for: IRON, TIBC, IRONPCTSAT (Iron and TIBC)  No results found for: FERRITIN  Urinalysis No results found for: COLORURINE, APPEARANCEUR, LABSPEC, PHURINE, GLUCOSEU, HGBUR, BILIRUBINUR, KETONESUR, PROTEINUR, UROBILINOGEN, NITRITE, LEUKOCYTESUR   STUDIES: MR BREAST BILATERAL W WO CONTRAST INC CAD  Result Date: 10/20/2019 CLINICAL DATA:  Right lumpectomy December 2018 with radiation and chemotherapy. Current exam is for risk in surveillance. LABS:  None EXAM: BILATERAL BREAST MRI WITH AND WITHOUT CONTRAST TECHNIQUE: Multiplanar, multisequence MR images of both breasts were obtained prior to and following the intravenous administration of 11 ml of Gadavist Three-dimensional MR images were rendered by post-processing of the original MR data on an independent workstation. The three-dimensional MR images were interpreted, and findings are reported in the following complete MRI report for this study. Three dimensional images were evaluated at the independent interpreting workstation using the DynaCAD thin client. COMPARISON:  Previous exam(s). FINDINGS: Breast composition: c. Heterogeneous fibroglandular tissue. Background parenchymal enhancement: Minimal Right breast: No interval change. No mass or abnormal enhancement. Postsurgical changes with metallic susceptibility artifact noted in the  inferior breast and in the posterior quadrant extending into the axilla. Left breast: No mass or abnormal enhancement. Lymph nodes: No abnormal appearing lymph nodes. Ancillary findings:  None. IMPRESSION: No MRI evidence of malignancy in either breast. Post lumpectomy changes on the right. RECOMMENDATION: Continued diagnostic mammography in November of 2021. Recommend continued annual screening breast MRI. BI-RADS CATEGORY  2: Benign. Electronically Signed   By: Dorise Bullion III M.D   On: 10/20/2019 15:42     ELIGIBLE FOR AVAILABLE RESEARCH PROTOCOL: no  ASSESSMENT: 64 y.o. [place] woman status post right breast lower outer quadrant biopsy 12/15/2016 for a clinical T1b N0, stage IA invasive ductal carcinoma, grade 3, triple positive, with an MIB-1 of 15%  (1) status post right lumpectomy 01/08/2017 for a  pT1c pN0, stage IA invasive ductal carcinoma, grade 2, with negative margins.    (2) adjuvant chemotherapy consisting of paclitaxel weekly x12, together with trastuzumab every 21 days started 01/31/2017  (3) trastuzumab continued to total 1 year (through December 2019)  (a) echocardiogram 01/17/2017 demonstrates a LVEF of 65-70%  (b) echocardiogram 04/19/2017 showed an ejection fraction in the 60-65% range  (c) echocardiogram 07/20/2017 showed an ejection fraction in the 55-60% range  (d) echocardiogram 10/26/2017 showed an ejection fraction in the 60-65% range  (4) adjuvant radiation completed 06/06/2017 Site/dose:   40.05 Gy directed to the right breast in 15 fractions followed by boost of 10 Gy in 5 fractions  (5) started tamoxifen 06/30/2017  (6) genetics testing completed on 02/20/2017 offered through [facility] showed: no deleterious mutations. The following genes were evaluated for sequence changes and exonic deletions/duplications: APC, ATM, AXIN2, BAP1, BARD1, BMPR1A, BRCA1, BRCA2, BRIP1, BUB1B, CDC73, CDH1, CDK4, CDKN1C, CDKN2A (p14ARF), CDKN2A (p16INK4a), CEP57, CHEK2, CTNNA1,  DICER1, DIS3L2, EPCAM*, FH, FLCN, GPC3, GREM1*, KIT, MEN1, MET, MLH1, MSH2, MSH3, MSH6, MUTYH, NBN, NF1, PALB2, PDGFRA, PMS2, POLD1, POLE, PTEN, RAD50, RAD51C, RAD51D,  SDHB,SDHC, SDHD, SMAD4, SMARCA4, SMARCB1, STK11, TP53, TSC1, TSC2, VHL, WT1. The following genes were evaluated for sequence changes only: HOXB13*, MITF*, NTHL1*, SDHA. Results are negative unless otherwise indicated  (7) intensified screening: Breast density category D, first live birth after age 36, Ashkenazi background (>20% lifetime)  (a) mammography  yearly  (b) breast MRI  yearly until breast density drops to B or lower   PLAN: Katie Marquez is now nearly 3 years out from definitive surgery for her breast cancer with no evidence of disease recurrence.  This is favorable.  She is tolerating tamoxifen well and the plan is to continue that a total of 5 years.  She understands tamoxifen does not cause weight gain but weight gain is very common at menopause and especially during this COVID-19 year many patients have been at home more and exercise less.  She is already on Rogaine for hair loss.  Unfortunately there is no really good solution for that problem that I am aware of.  I suspect tamoxifen may be only minimally contributing if at all.  We reviewed her MRI results which calls her breast density C.  This is better it means the breasts are less dense.  If and when her breast become breast density B we can discontinue MRI yearly  Otherwise she will have mammography in March and see me in May.  She knows to call for any other issue that may develop before the next visit  Total encounter time 25 minutes.*  Tayler Lassen, Virgie Dad, MD  10/28/19 3:23 PM Medical Oncology and Hematology [facility] [place],  [id number] Tel. [phone]    Fax. [phone]   I, Wilburn Mylar, am acting as scribe for Dr. Virgie Dad. Gurjot Brisco.  I, Lurline Del MD, have reviewed the above documentation for accuracy  and completeness, and I agree with the above.    *Total Encounter Time as defined by the Centers for Medicare and Medicaid Services includes, in addition to the face-to-face time of a patient visit (documented in the note above) non-face-to-face time: obtaining and reviewing outside history, ordering and reviewing medications, tests or procedures, care coordination (communications with other health care professionals or caregivers) and documentation in the medical record.

## 2019-10-30 ENCOUNTER — Telehealth: Payer: Self-pay | Admitting: Oncology

## 2019-10-30 NOTE — Telephone Encounter (Signed)
Scheduled appts per 9/29 staff msg. Pt confirmed appt date and time.

## 2019-11-15 IMAGING — MG MM PLC BREAST LOC DEV 1ST LESION INC*R*
5 series · 5 of 5 positions shown · non-contrast
Comparison: Previous exam(s).

CLINICAL DATA: 61-year-old with biopsy-proven grade 3 invasive duct
carcinoma and DCIS involving the lower outer quadrant of the right
breast. Radioactive seed localization is performed in anticipation
of lumpectomy which is scheduled for 01/08/2017.

EXAM:
MAMMOGRAPHIC GUIDED RADIOACTIVE SEED LOCALIZATION OF THE RIGHT
BREAST

[R CC (1 of 2)]
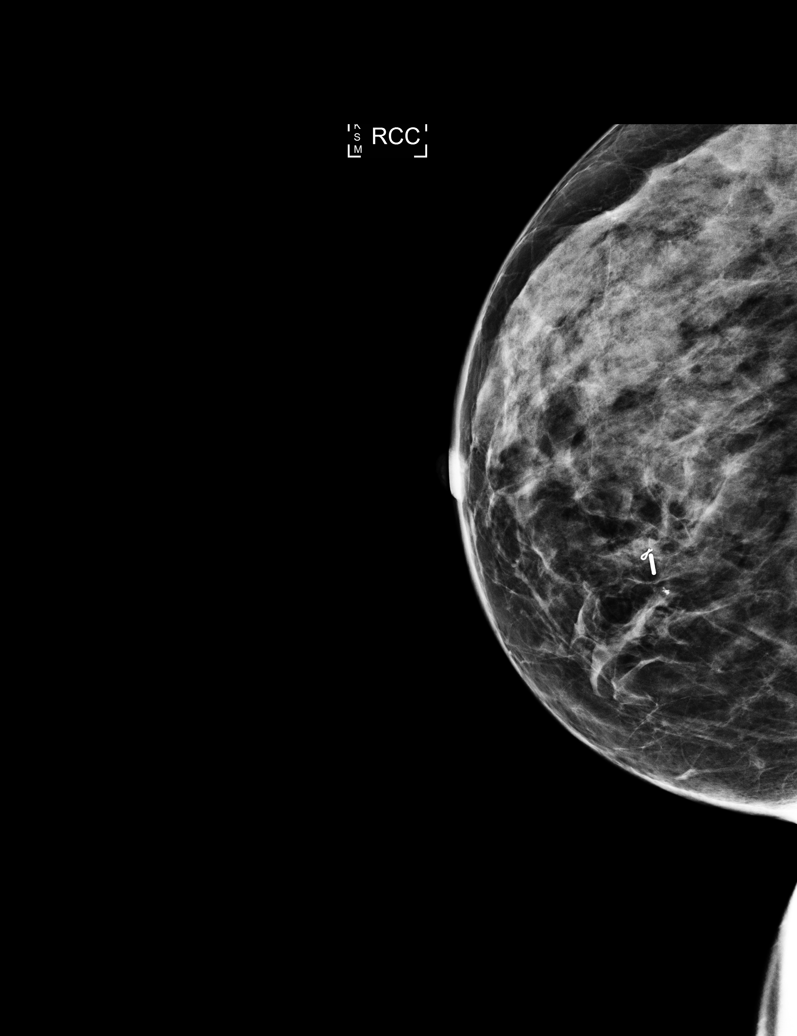

[R CC (2 of 2)]
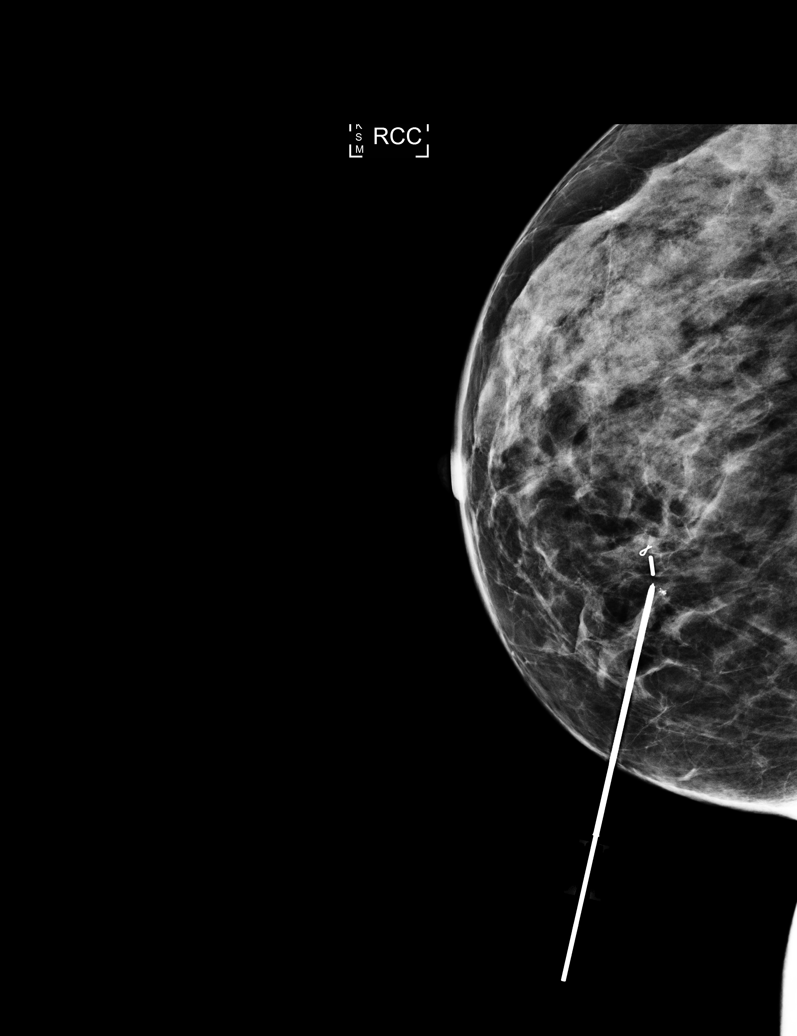

[R ML (1 of 3)]
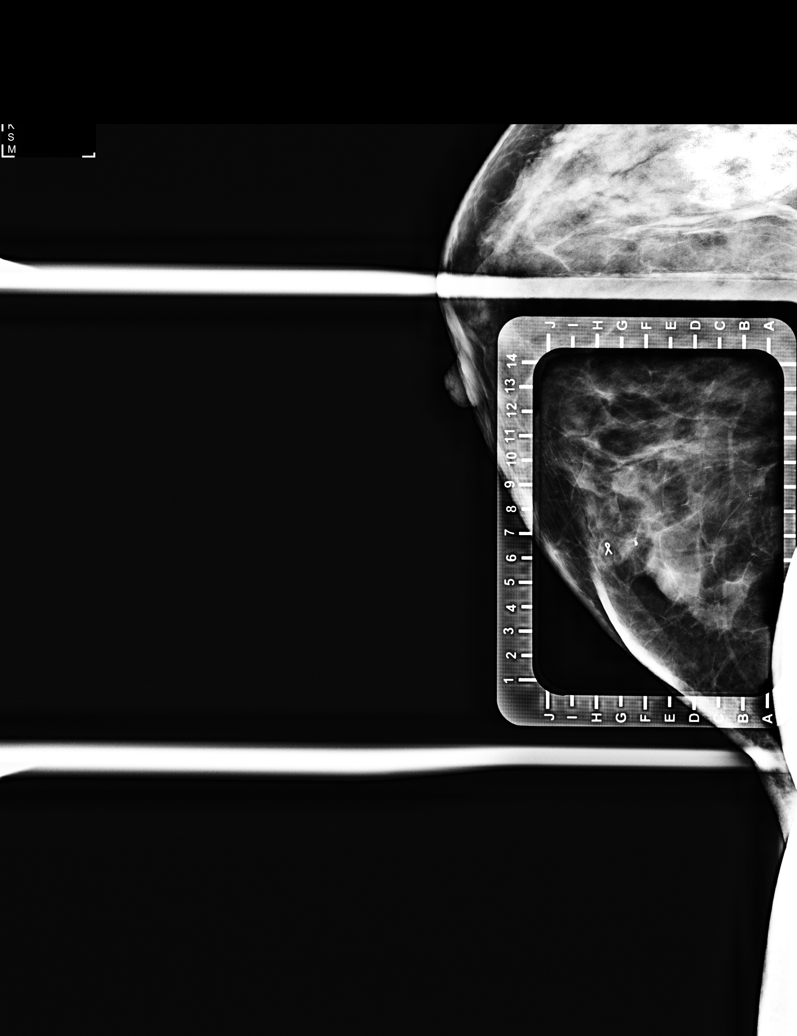

[R ML (2 of 3)]
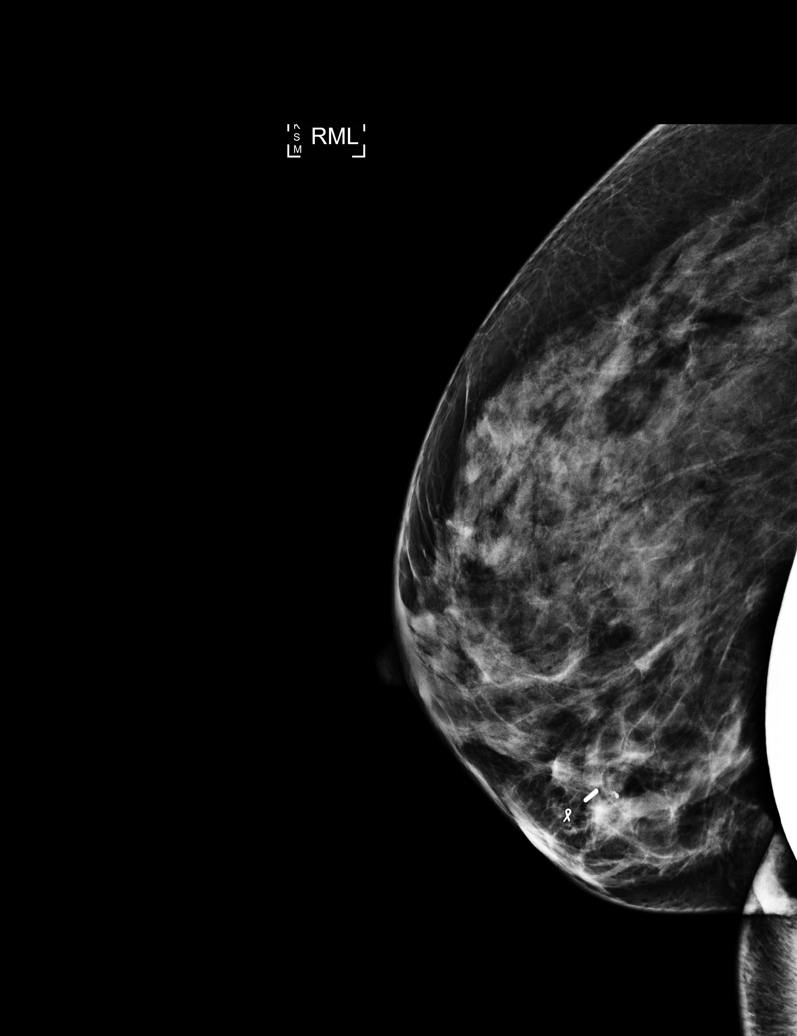

[R ML (3 of 3)]
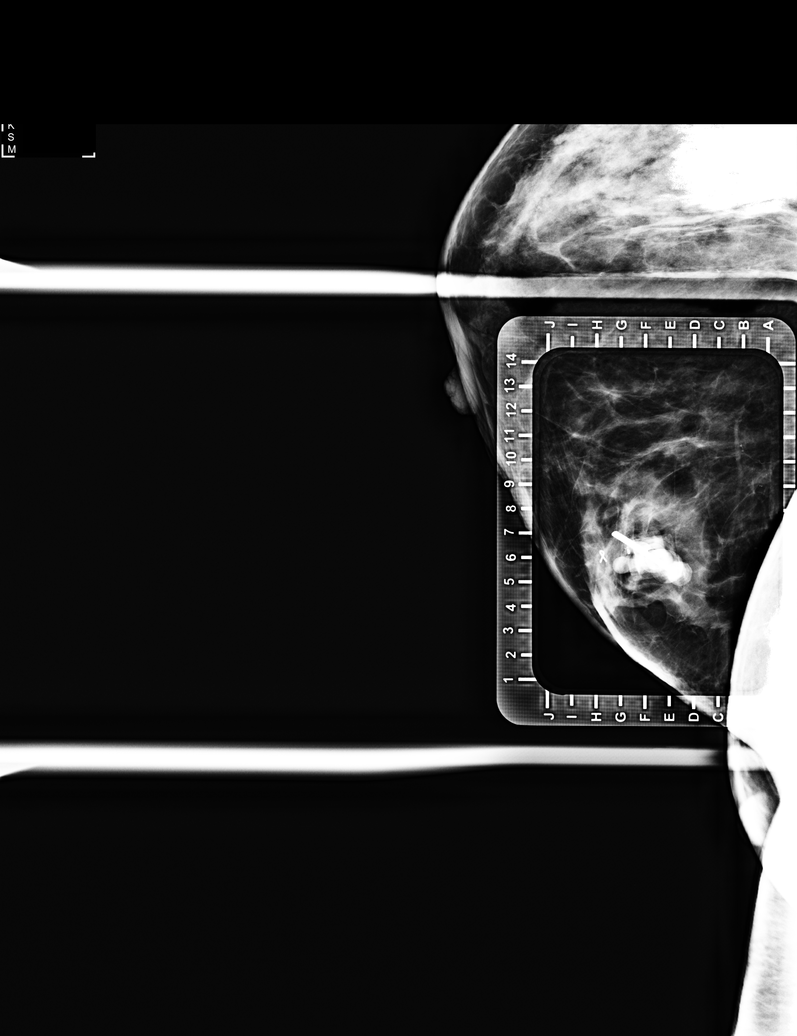

[5 of 5 positions shown; findings below may reference images not displayed]

FINDINGS: Patient presents for radioactive seed localization prior to right
breast lumpectomy. I met with the patient and we discussed the
procedure of seed localization including benefits and alternatives.
We discussed the high likelihood of a successful procedure. We
discussed the risks of the procedure including infection, bleeding,
tissue injury and further surgery. We discussed the low dose of
radioactivity involved in the procedure. Informed, written consent
was given.

The usual time-out protocol was performed immediately prior to the
procedure.

Using mammographic guidance, sterile technique with chlorhexidine as
skin antisepsis, 1% lidocaine as local anesthesia, an 1-NOB
radioactive seed was used to localize the ribbon shaped tissue
marker clip associated with the biopsy-proven IDC and DCIS using a
medial approach. The follow-up mammogram images confirm the seed in
the expected location approximately 4 mm posterior to the clip. The
images were marked for Dr. Araylym.

Follow-up survey of the right breast confirms the presence of the
radioactive seed.

Order number of 1-NOB seed: [id number]

Total activity: 0.249 mCi  Reference Date: 12/26/2016

The patient tolerated the procedure well and was released from the
[REDACTED]. She was given instructions regarding seed removal.
IMPRESSION: Radioactive seed localization of the biopsy-proven invasive ductal
carcinoma and DCIS involving the lower outer quadrant of the right
breast. No apparent complications.

## 2019-11-18 IMAGING — DX DG CHEST 1V PORT
1 series · 1 of 1 positions shown · non-contrast
Comparison: 01/04/2005 chest radiograph.

CLINICAL DATA: Port-A-Cath placement

EXAM:
PORTABLE CHEST 1 VIEW

[chest ap]
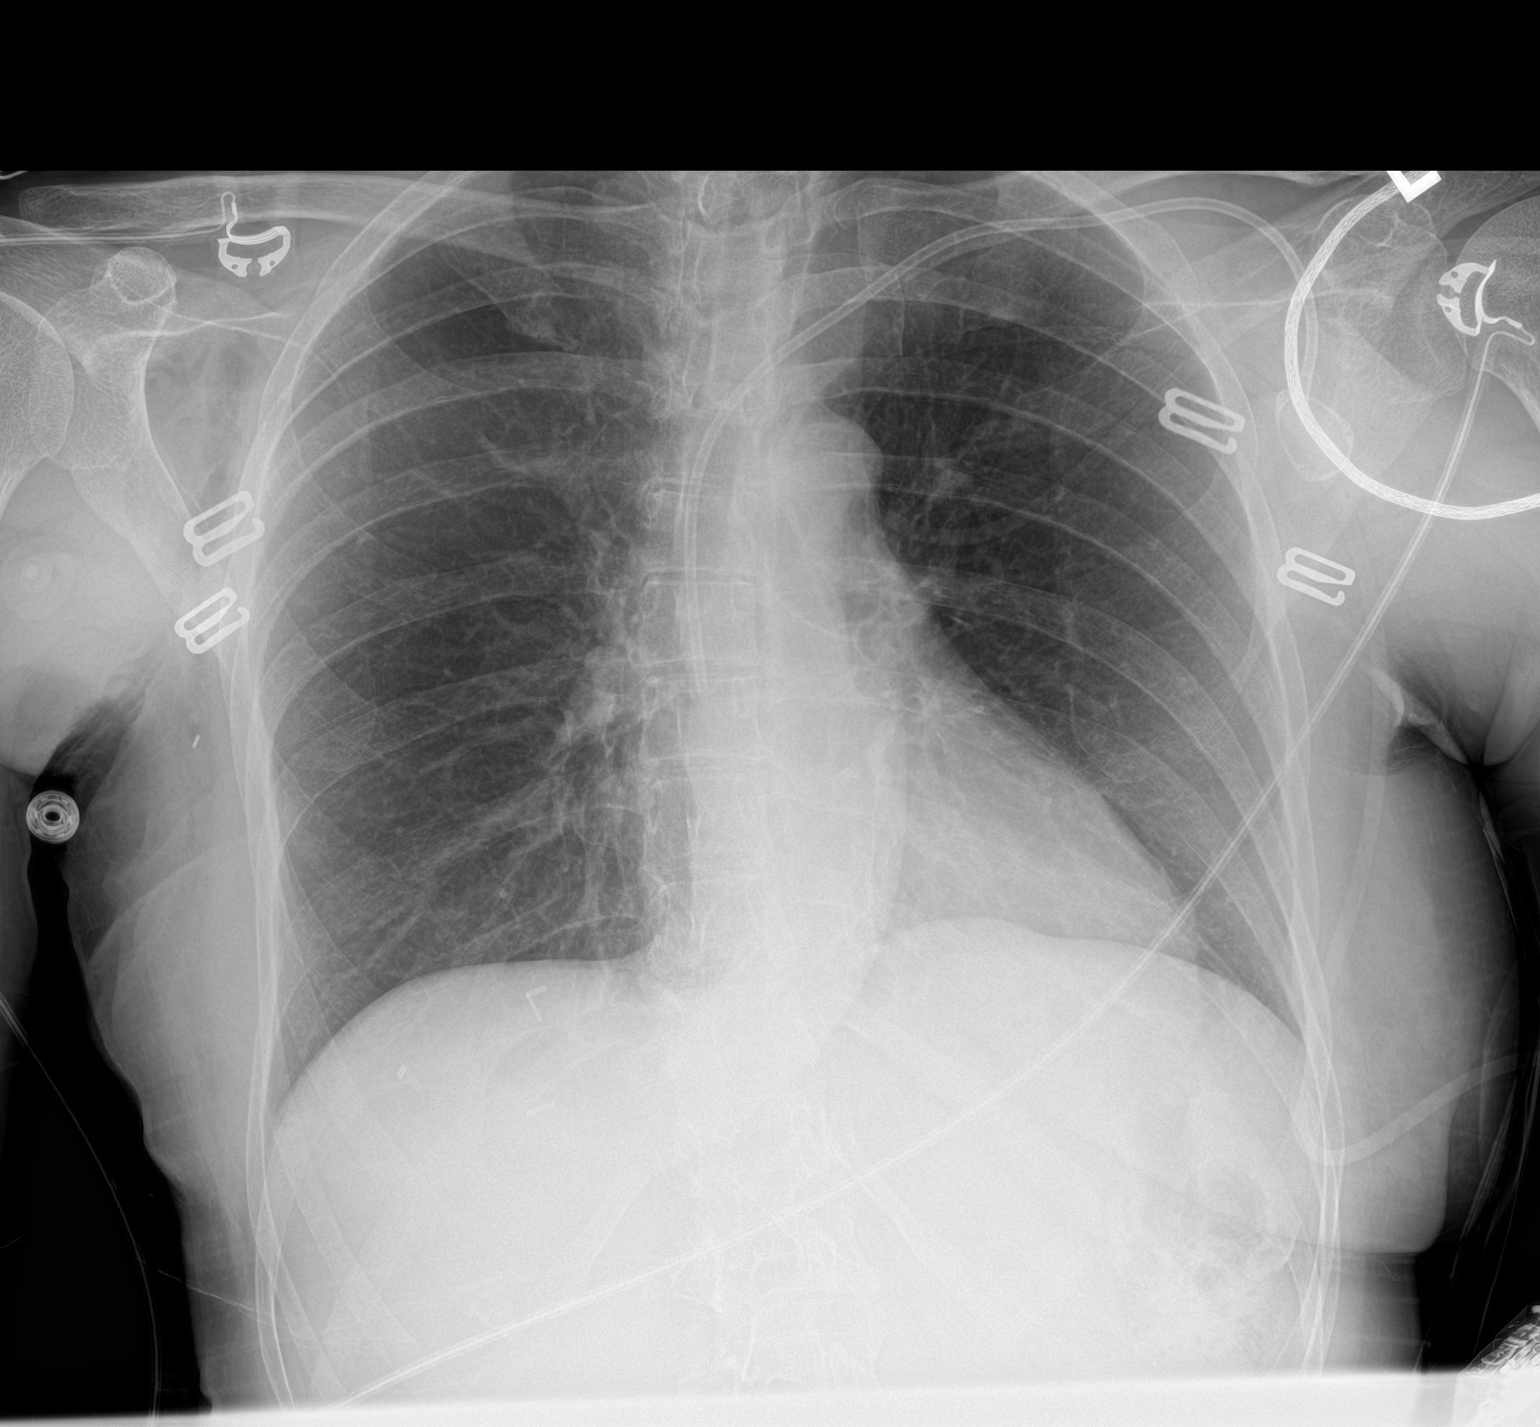

[1 of 1 positions shown; findings below may reference images not displayed]

FINDINGS: Left subclavian Port-A-Cath terminates in the lower third of the
superior vena cava. Stable cardiomediastinal silhouette with normal
heart size. No pneumothorax. No pleural effusion. Lungs appear
clear, with no acute consolidative airspace disease and no pulmonary
edema. Surgical clips overlie the right axilla and right breast.
IMPRESSION: 1. Left subclavian Port-A-Cath terminates in the lower third of the
SVC. No pneumothorax.
2. No active cardiopulmonary disease.

## 2019-11-18 IMAGING — MG MM BREAST SURGICAL SPECIMEN
1 series · 1 of 1 positions shown · non-contrast
Comparison: Previous exam(s).

CLINICAL DATA: Right breast cancer status post right lumpectomy.

EXAM:
SPECIMEN RADIOGRAPH OF THE RIGHT BREAST

[R]
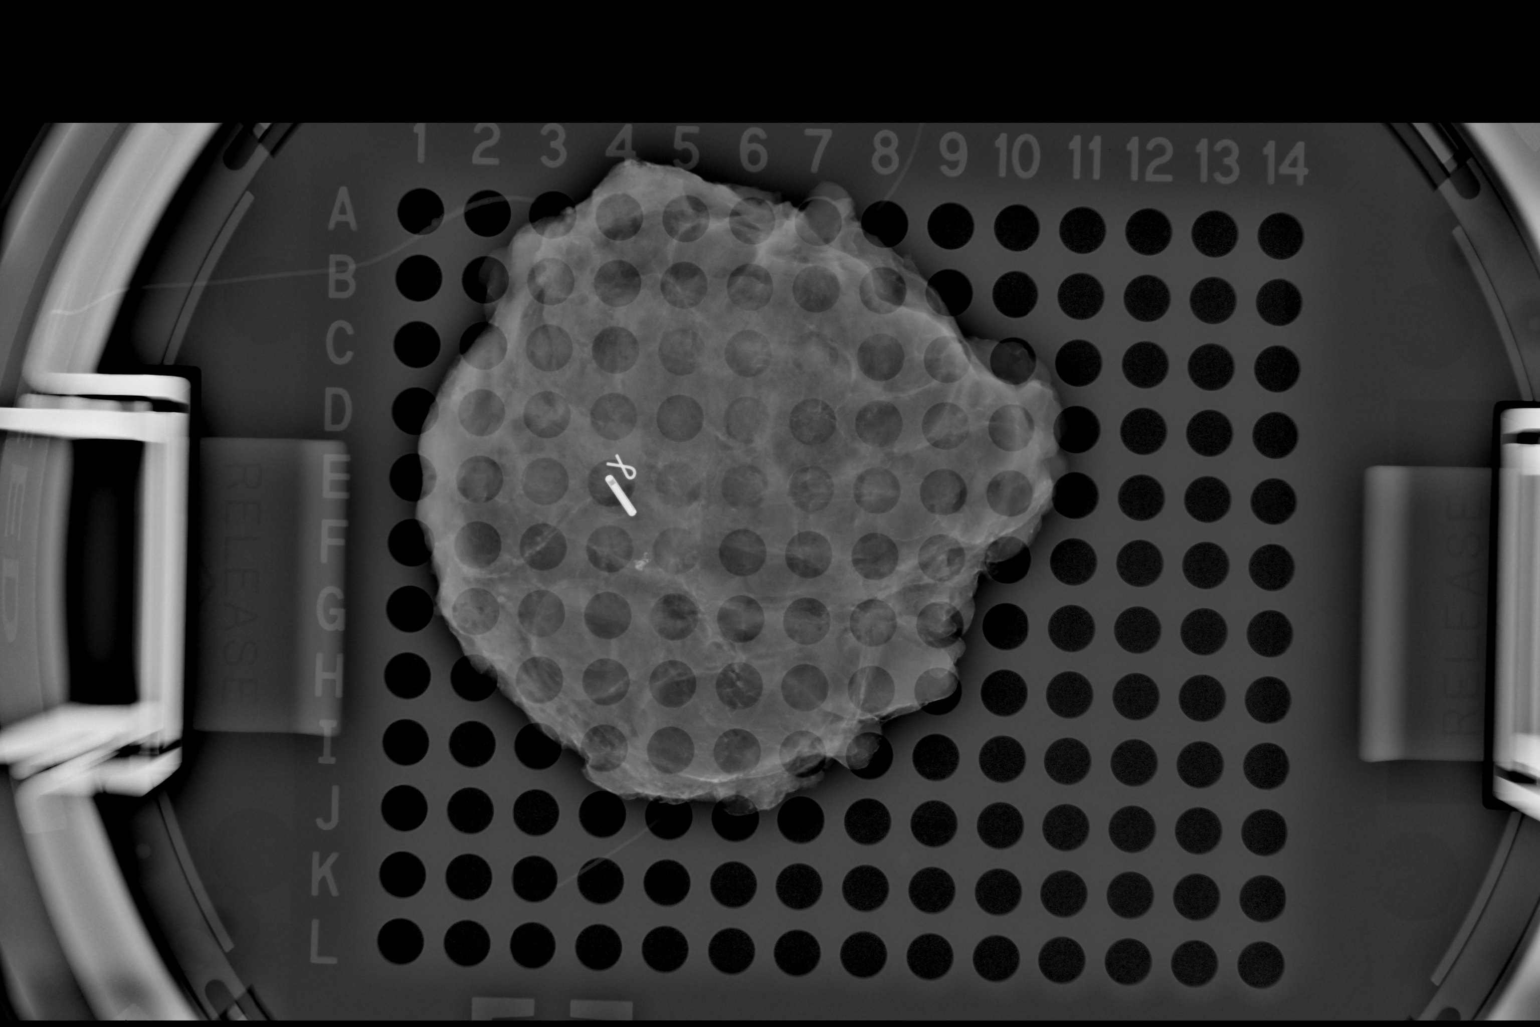

[1 of 1 positions shown; findings below may reference images not displayed]

FINDINGS: Status post excision of the right breast. The radioactive seed and
ribbon biopsy marker clip are present, completely intact, and were
marked for pathology.
IMPRESSION: Specimen radiograph of the right breast.

## 2020-01-08 ENCOUNTER — Other Ambulatory Visit: Payer: Self-pay | Admitting: Oncology

## 2020-02-12 ENCOUNTER — Encounter: Payer: Self-pay | Admitting: Oncology

## 2020-03-25 ENCOUNTER — Other Ambulatory Visit: Payer: Self-pay

## 2020-03-25 DIAGNOSIS — Z170 Estrogen receptor positive status [ER+]: Secondary | ICD-10-CM

## 2020-03-25 DIAGNOSIS — C50511 Malignant neoplasm of lower-outer quadrant of right female breast: Secondary | ICD-10-CM

## 2020-03-28 NOTE — Progress Notes (Signed)
[place]  [phone]) [phone] [phone]) [phone]     ID: Katie Marquez DOB: [birth year]  MR#: [id number]  TKW#:[id number]  Patient Care Team: Jonathon Jordan, MD as PCP - General (Family Medicine) Yavuz Kirby, Virgie Dad, MD as Consulting Physician (Oncology) Richmond Campbell, MD as Consulting Physician (Gastroenterology) Eppie Gibson, MD as Attending Physician (Radiation Oncology) Lovey Newcomer, MD as Attending Physician (Radiology) Bensimhon, Shaune Pascal, MD as Consulting Physician (Cardiology) Molli Posey, MD as Consulting Physician (Obstetrics and Gynecology) Hennie Duos, MD as Consulting Physician (Rheumatology) Rolm Bookbinder, MD as Consulting Physician (General Surgery) OTHER MD:   CHIEF COMPLAINT: Triple positive breast cancer  CURRENT TREATMENT: tamoxifen   INTERVAL HISTORY: Katie Marquez returns today for follow-up of her triple positive breast cancer.   She continues on tamoxifen.  Hot flashes and vaginal wetness are not issues.  She continues to have concerns regarding hair loss which may or may not be exacerbated by tamoxifen.  Since her last visit, she has not undergone any additional studies. She is scheduled for routine mammography tomorrow, 03/30/2020.   REVIEW OF SYSTEMS: Katie Marquez is very active, walking 30 to 60 minutes at least 3 days a week and starting to play pickle ball.  She and her husband have retired and they are spending a lot of time in [place] which is where her only daughter and their first grandchild live.  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  A detailed review of systems today was otherwise stable.   COVID 19 VACCINATION STATUS: fully vaccinated [employer]), with booster September 2021   HISTORY OF CURRENT ILLNESS: From the original intake note:  Katie Marquez had bilateral screening mammography with tomography at the breast center 12/12/2016, showing a  possible mass in the right breast.  Right breast diagnostic mammography with tomography and right breast ultrasonography 12/15/2016 found the breast density to be category D.  In the lower right breast there was a small lobulated mass which was not palpable.  Ultrasound showed a 0.8 cm mass in the right breast 7:00 radiant 2 cm from the nipple.  The right axilla was benign.  Biopsy of the right breast mass in question 12/15/2016 showed ([id number]) and invasive ductal carcinoma, grade 3, estrogen receptor at 95% positive, progesterone receptor 50% positive, both with strong staining intensity, with an MIB-1 of 15%, and HER-2 amplification, the signals ratio being 1.83 but the number per cell 10.23.  The patient's subsequent history is as detailed below.   PAST MEDICAL HISTORY: Past Medical History:  Diagnosis Date  . Anxiety   . Cancer [facility])    breast cancer  . Diverticulosis   . Family history of kidney cancer   . Family history of thyroid cancer   . Heart murmur   . History of radiation therapy 05/09/17- 06/06/17   40.05 Gy directed to the right breast in 15 fractions followed by boost of 10 Gy in 5 fractions.   . Hypertension   . IBS (irritable bowel syndrome)   . Iron deficiency   . Osteoarthritis   . Personal history of chemotherapy   . Personal history of radiation therapy   . PONV (postoperative nausea and vomiting)   . Tremors of nervous system    essential tremors    PAST SURGICAL HISTORY: Past Surgical History:  Procedure Laterality Date  . BREAST LUMPECTOMY    . BREAST LUMPECTOMY WITH RADIOACTIVE SEED AND SENTINEL LYMPH NODE BIOPSY Right 01/08/2017  Procedure: RIGHT BREAST LUMPECTOMY WITH RADIOACTIVE SEED AND RIGHT SENTINEL LYMPH NODE BIOPSY ERAS PATHWAY;  Surgeon: Fanny Skates, MD;  Location: [facility];  Service: General;  Laterality: Right;  PEC BLOCK  . LAPAROSCOPIC SIGMOID COLECTOMY    . LAPAROSCOPY    . PORTACATH PLACEMENT Left 01/08/2017   Procedure: INSERTION  PORT-A-CATH WITH ULTRA SOUND;  Surgeon: Fanny Skates, MD;  Location[facility] OR;  Service: General;  Laterality: Left;    FAMILY HISTORY Family History  Problem Relation Age of Onset  . Rheum arthritis Mother   . Hyperlipidemia Mother   . Hypertension Mother   . Thyroid cancer Mother 36  . Diverticulitis Father   . Parkinson's disease Father   . Kidney cancer Father 68  . Multiple sclerosis Maternal Aunt   . Breast cancer Neg Hx   Katie Marquez comes from an [place] background.  Her father died at 33 with Parkinson's disease and complications from tobacco abuse including a history of renal cancer; the patient's mother died at age 61, with a history of rheumatoid arthritis and thyroid cancer.  The patient had no brothers, 1 sister, in good health.  There is no history of breast or ovarian cancer in the family   GYNECOLOGIC HISTORY:  No LMP recorded. Patient is postmenopausal. Menarche age 33, first live birth age 25.  The patient is GX P1.  She had infertility treatments after her first live birth but they were not successful.  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  They retired in 2021.  Their daughter Katie Marquez lives in [place] and previously attended [employer] and then [employer] in business and psychology. She married in September 2019, her husband is in finance, and they had a daughter December 2020.    ADVANCED DIRECTIVES: In the absence of any documentation to the contrary, the patient's spouse is their HCPOA.    HEALTH MAINTENANCE: Social History   Tobacco Use  . Smoking status: Never Smoker  . Smokeless tobacco: Never Used  Vaping Use  . Vaping Use: Never used  Substance Use Topics  . Alcohol use: Yes    Comment: occas  . Drug use: No      Colonoscopy: [facility]  PAP: [place]  Bone density: Osteopenia/ [name] around 2017   No Known Allergies  Current Outpatient Medications  Medication Sig Dispense Refill  . atorvastatin (LIPITOR) 10 MG tablet Take 1 tablet (10 mg total) by mouth daily.    . minoxidil (ROGAINE) 2 % external solution Apply topically 2 (two) times daily. 60 mL 0  . propranolol (INDERAL) 20 MG tablet Take 1 tablet (20 mg total) by mouth 3 (three) times daily.    Marland Kitchen CALCIUM PO Take by mouth daily.    . cholecalciferol (VITAMIN D) 1000 units tablet Take 2,000 Units by mouth daily.    Marland Kitchen FLUoxetine (PROZAC) 10 MG capsule Take 20 mg by mouth daily.     . Probiotic Product (PROBIOTIC-10 PO) Take by mouth.    . tamoxifen (NOLVADEX) 20 MG tablet Take 1 tablet (20 mg total) by mouth daily. 90 tablet 4   No current facility-administered medications for this visit.    OBJECTIVE: White woman who appears younger than stated age  83:   03/29/20 1132  BP: 137/79  Pulse: 65  Resp: 18  Temp: 97.7 F (36.5 C)  SpO2: 99%   Wt Readings from Last 3 Encounters:  03/29/20 123 lb 4.8 oz (55.9 kg)  10/28/19 119 lb (54 kg)  10/14/18 120 lb 6.4 oz (54.6 kg)   Body mass index is 22.55 kg/m.    ECOG FS:1 - Symptomatic but completely ambulatory  Sclerae unicteric, EOMs intact Wearing a mask No cervical or supraclavicular adenopathy Lungs no rales or rhonchi Heart regular rate and rhythm Abd soft, nontender, positive bowel sounds MSK no focal spinal tenderness, no upper extremity lymphedema Neuro: nonfocal, well oriented, appropriate affect Breasts: The right breast is status post lumpectomy and radiation.  The cosmetic result is excellent.  There is no evidence of residual or recurrent disease.  The left breast is benign.  Both axillae are benign.   LAB RESULTS:  CMP     Component Value Date/Time   NA 139 03/29/2020 1105   NA 141 01/31/2017 1225   K 4.4 03/29/2020 1105   K 4.1 01/31/2017 1225    CL 106 03/29/2020 1105   CO2 29 03/29/2020 1105   CO2 29 01/31/2017 1225   GLUCOSE 80 03/29/2020 1105   GLUCOSE 86 01/31/2017 1225   BUN 18 03/29/2020 1105   BUN 17.4 01/31/2017 1225   CREATININE 0.87 03/29/2020 1105   CREATININE 0.8 01/31/2017 1225   CALCIUM 9.4 03/29/2020 1105   CALCIUM 10.0 01/31/2017 1225   PROT 6.8 03/29/2020 1105   PROT 7.2 01/31/2017 1225   ALBUMIN 3.9 03/29/2020 1105   ALBUMIN 4.0 01/31/2017 1225   AST 15 03/29/2020 1105   AST 18 01/31/2017 1225   ALT 11 03/29/2020 1105   ALT 29 01/31/2017 1225   ALKPHOS 52 03/29/2020 1105   ALKPHOS 66 01/31/2017 1225   BILITOT 0.4 03/29/2020 1105   BILITOT 0.57 01/31/2017 1225   GFRNONAA >60 03/29/2020 1105   GFRAA >60 10/28/2019 1203    No results found for: TOTALPROTELP, ALBUMINELP, A1GS, A2GS, BETS, BETA2SER, GAMS, MSPIKE, SPEI  No results found for: Nils Pyle, [facility]  Lab Results  Component Value Date   WBC 7.6 03/29/2020   NEUTROABS 5.0 03/29/2020   HGB 14.2 03/29/2020   HCT 43.5 03/29/2020   MCV 88.1 03/29/2020   PLT 270 03/29/2020      Chemistry      Component Value Date/Time   NA 139 03/29/2020 1105   NA 141 01/31/2017 1225   K 4.4 03/29/2020 1105   K 4.1 01/31/2017 1225   CL 106 03/29/2020 1105   CO2 29 03/29/2020 1105   CO2 29 01/31/2017 1225   BUN 18 03/29/2020 1105   BUN 17.4 01/31/2017 1225   CREATININE 0.87 03/29/2020 1105   CREATININE 0.8 01/31/2017 1225      Component Value Date/Time   CALCIUM 9.4 03/29/2020 1105   CALCIUM 10.0 01/31/2017 1225   ALKPHOS 52 03/29/2020 1105   ALKPHOS 66 01/31/2017 1225   AST 15 03/29/2020 1105   AST 18 01/31/2017 1225   ALT 11 03/29/2020 1105   ALT 29 01/31/2017 1225   BILITOT 0.4 03/29/2020 1105   BILITOT 0.57 01/31/2017 1225       No results found for: LABCA2  No components found for: HALPFX902  No results for input(s): INR in the last 168 hours.  No results found for: LABCA2  No results found for: IOX735  No  results found for: HGD924  No results found for: QAS341  No results found for: CA2729  No components found  for: HGQUANT  No results found for: CEA1 / No results found for: CEA1   No results found for: AFPTUMOR  No results found for: CHROMOGRNA  No results found for: HGBA, HGBA2QUANT, HGBFQUANT, HGBSQUAN (Hemoglobinopathy evaluation)   No results found for: LDH  No results found for: IRON, TIBC, IRONPCTSAT (Iron and TIBC)  No results found for: FERRITIN  Urinalysis No results found for: COLORURINE, APPEARANCEUR, LABSPEC, PHURINE, GLUCOSEU, HGBUR, BILIRUBINUR, KETONESUR, PROTEINUR, UROBILINOGEN, NITRITE, LEUKOCYTESUR   STUDIES: No results found.   ELIGIBLE FOR AVAILABLE RESEARCH PROTOCOL: no  ASSESSMENT: 65 y.o. [place] woman status post right breast lower outer quadrant biopsy 12/15/2016 for a clinical T1b N0, stage IA invasive ductal carcinoma, grade 3, triple positive, with an MIB-1 of 15%  (1) status post right lumpectomy 01/08/2017 for a  pT1c pN0, stage IA invasive ductal carcinoma, grade 2, with negative margins.    (2) adjuvant chemotherapy consisting of paclitaxel weekly x12, together with trastuzumab every 21 days started 01/31/2017  (3) trastuzumab continued to total 1 year (through December 2019)  (a) echocardiogram 01/17/2017 demonstrates a LVEF of 65-70%  (b) echocardiogram 04/19/2017 showed an ejection fraction in the 60-65% range  (c) echocardiogram 07/20/2017 showed an ejection fraction in the 55-60% range  (d) echocardiogram 10/26/2017 showed an ejection fraction in the 60-65% range  (4) adjuvant radiation completed 06/06/2017 Site/dose:   40.05 Gy directed to the right breast in 15 fractions followed by boost of 10 Gy in 5 fractions  (5) started tamoxifen 06/30/2017  (6) genetics testing completed on 02/20/2017 offered through [facility] showed: no deleterious mutations. The following genes were evaluated for sequence changes and exonic  deletions/duplications: APC, ATM, AXIN2, BAP1, BARD1, BMPR1A, BRCA1, BRCA2, BRIP1, BUB1B, CDC73, CDH1, CDK4, CDKN1C, CDKN2A (p14ARF), CDKN2A (p16INK4a), CEP57, CHEK2, CTNNA1, DICER1, DIS3L2, EPCAM*, FH, FLCN, GPC3, GREM1*, KIT, MEN1, MET, MLH1, MSH2, MSH3, MSH6, MUTYH, NBN, NF1, PALB2, PDGFRA, PMS2, POLD1, POLE, PTEN, RAD50, RAD51C, RAD51D, SDHB,SDHC, SDHD, SMAD4, SMARCA4, SMARCB1, STK11, TP53, TSC1, TSC2, VHL, WT1. The following genes were evaluated for sequence changes only: HOXB13*, MITF*, NTHL1*, SDHA. Results are negative unless otherwise indicated  (7) intensified screening: Breast density category D, first live birth after age 1, Ashkenazi background (>20% lifetime)  (a) mammography  yearly  (b) breast MRI  yearly until breast density drops to B on 2 separate determinations   PLAN: Katie Marquez is now a little over 3 years out from definitive surgery for her breast cancer with no evidence of disease recurrence.  This is very favorable.  She is tolerating tamoxifen well.  The only flying the ointment is the hair loss issue.  Clinically this is not apparent at least to me but it is a great concern to her.  She continues to use Rogaine possibly with some results.  She is very clear however that even if tamoxifen is the reason for the hair thinning (it might simply be menopause) she would want to continue it until the 5 years are done.  That will take her to May 2024  I congratulated her on her grandchild.  The timing of her studies and visits may be modified here and there because they are spending a good deal of time in [place] with family  I did set her up for an MRI in September and to see me shortly thereafter.  If she sees her surgeon Dr. Donne Hazel in March and sees [place] in September that will guarantee the optimal follow-up routine  Total encounter time 25 minutes.*   Gilberta Peeters, Sarajane Jews  C, MD  03/29/20 11:59 AM Medical Oncology and Hematology [facility] [place], [place] [address] Tel. [phone]    Fax. [phone]   I, Wilburn Mylar, am acting as scribe for Dr. Virgie Dad. Salihah Peckham.  I, Lurline Del MD, have reviewed the above documentation for accuracy and completeness, and I agree with the above.   *Total Encounter Time as defined by the Centers for Medicare and Medicaid Services includes, in addition to the face-to-face time of a patient visit (documented in the note above) non-face-to-face time: obtaining and reviewing outside history, ordering and reviewing medications, tests or procedures, care coordination (communications with other health care professionals or caregivers) and documentation in the medical record.

## 2020-03-29 ENCOUNTER — Other Ambulatory Visit: Payer: Self-pay

## 2020-03-29 ENCOUNTER — Inpatient Hospital Stay: Payer: Medicare Other | Attending: Oncology

## 2020-03-29 ENCOUNTER — Inpatient Hospital Stay (HOSPITAL_BASED_OUTPATIENT_CLINIC_OR_DEPARTMENT_OTHER): Payer: Medicare Other | Admitting: Oncology

## 2020-03-29 VITALS — BP 137/79 | HR 65 | Temp 97.7°F | Resp 18 | Ht 62.0 in | Wt 123.3 lb

## 2020-03-29 DIAGNOSIS — C50511 Malignant neoplasm of lower-outer quadrant of right female breast: Secondary | ICD-10-CM | POA: Insufficient documentation

## 2020-03-29 DIAGNOSIS — Z923 Personal history of irradiation: Secondary | ICD-10-CM | POA: Insufficient documentation

## 2020-03-29 DIAGNOSIS — M1990 Unspecified osteoarthritis, unspecified site: Secondary | ICD-10-CM | POA: Insufficient documentation

## 2020-03-29 DIAGNOSIS — Z79899 Other long term (current) drug therapy: Secondary | ICD-10-CM | POA: Insufficient documentation

## 2020-03-29 DIAGNOSIS — Z170 Estrogen receptor positive status [ER+]: Secondary | ICD-10-CM | POA: Diagnosis not present

## 2020-03-29 DIAGNOSIS — R768 Other specified abnormal immunological findings in serum: Secondary | ICD-10-CM | POA: Diagnosis not present

## 2020-03-29 DIAGNOSIS — K589 Irritable bowel syndrome without diarrhea: Secondary | ICD-10-CM | POA: Insufficient documentation

## 2020-03-29 DIAGNOSIS — E785 Hyperlipidemia, unspecified: Secondary | ICD-10-CM | POA: Diagnosis not present

## 2020-03-29 DIAGNOSIS — R232 Flushing: Secondary | ICD-10-CM | POA: Diagnosis not present

## 2020-03-29 DIAGNOSIS — Z803 Family history of malignant neoplasm of breast: Secondary | ICD-10-CM | POA: Diagnosis not present

## 2020-03-29 DIAGNOSIS — Z1239 Encounter for other screening for malignant neoplasm of breast: Secondary | ICD-10-CM

## 2020-03-29 DIAGNOSIS — M069 Rheumatoid arthritis, unspecified: Secondary | ICD-10-CM | POA: Insufficient documentation

## 2020-03-29 DIAGNOSIS — I10 Essential (primary) hypertension: Secondary | ICD-10-CM | POA: Diagnosis not present

## 2020-03-29 DIAGNOSIS — F419 Anxiety disorder, unspecified: Secondary | ICD-10-CM | POA: Diagnosis not present

## 2020-03-29 DIAGNOSIS — Z9221 Personal history of antineoplastic chemotherapy: Secondary | ICD-10-CM | POA: Insufficient documentation

## 2020-03-29 LAB — CBC WITH DIFFERENTIAL (CANCER CENTER ONLY)
Abs Immature Granulocytes: 0.02 10*3/uL (ref 0.00–0.07)
Basophils Absolute: 0.1 10*3/uL (ref 0.0–0.1)
Basophils Relative: 1 %
Eosinophils Absolute: 0.2 10*3/uL (ref 0.0–0.5)
Eosinophils Relative: 3 %
HCT: 43.5 % (ref 36.0–46.0)
Hemoglobin: 14.2 g/dL (ref 12.0–15.0)
Immature Granulocytes: 0 %
Lymphocytes Relative: 22 %
Lymphs Abs: 1.7 10*3/uL (ref 0.7–4.0)
MCH: 28.7 pg (ref 26.0–34.0)
MCHC: 32.6 g/dL (ref 30.0–36.0)
MCV: 88.1 fL (ref 80.0–100.0)
Monocytes Absolute: 0.7 10*3/uL (ref 0.1–1.0)
Monocytes Relative: 9 %
Neutro Abs: 5 10*3/uL (ref 1.7–7.7)
Neutrophils Relative %: 65 %
Platelet Count: 270 10*3/uL (ref 150–400)
RBC: 4.94 MIL/uL (ref 3.87–5.11)
RDW: 12.8 % (ref 11.5–15.5)
WBC Count: 7.6 10*3/uL (ref 4.0–10.5)
nRBC: 0 % (ref 0.0–0.2)

## 2020-03-29 LAB — CMP (CANCER CENTER ONLY)
ALT: 11 U/L (ref 0–44)
AST: 15 U/L (ref 15–41)
Albumin: 3.9 g/dL (ref 3.5–5.0)
Alkaline Phosphatase: 52 U/L (ref 38–126)
Anion gap: 4 — ABNORMAL LOW (ref 5–15)
BUN: 18 mg/dL (ref 8–23)
CO2: 29 mmol/L (ref 22–32)
Calcium: 9.4 mg/dL (ref 8.9–10.3)
Chloride: 106 mmol/L (ref 98–111)
Creatinine: 0.87 mg/dL (ref 0.44–1.00)
GFR, Estimated: >60 mL/min (ref >60)
Glucose, Bld: 80 mg/dL (ref 70–99)
Potassium: 4.4 mmol/L (ref 3.5–5.1)
Sodium: 139 mmol/L (ref 135–145)
Total Bilirubin: 0.4 mg/dL (ref 0.3–1.2)
Total Protein: 6.8 g/dL (ref 6.5–8.1)

## 2020-03-29 MED ORDER — TAMOXIFEN CITRATE 20 MG PO TABS
20.0000 mg | ORAL_TABLET | Freq: Every day | ORAL | 4 refills | Status: DC
Start: 2020-03-29 — End: 2020-10-25

## 2020-03-30 ENCOUNTER — Telehealth: Payer: Self-pay | Admitting: Oncology

## 2020-03-30 ENCOUNTER — Ambulatory Visit
Admission: RE | Admit: 2020-03-30 | Discharge: 2020-03-30 | Disposition: A | Discharge status: Home / Self Care | Payer: Medicare Other | Source: Ambulatory Visit | Attending: Oncology | Admitting: Oncology

## 2020-03-30 DIAGNOSIS — Z170 Estrogen receptor positive status [ER+]: Secondary | ICD-10-CM

## 2020-03-30 DIAGNOSIS — R768 Other specified abnormal immunological findings in serum: Secondary | ICD-10-CM

## 2020-03-30 DIAGNOSIS — C50511 Malignant neoplasm of lower-outer quadrant of right female breast: Secondary | ICD-10-CM

## 2020-03-30 NOTE — Telephone Encounter (Signed)
Scheduled appts per 2/28 los. Pt confirmed appt date/time.

## 2020-04-15 ENCOUNTER — Ambulatory Visit: Payer: Medicare Other | Admitting: Oncology

## 2020-04-15 ENCOUNTER — Other Ambulatory Visit: Payer: Medicare Other

## 2020-10-18 ENCOUNTER — Ambulatory Visit
Admission: RE | Admit: 2020-10-18 | Discharge: 2020-10-18 | Disposition: A | Discharge status: Home / Self Care | Payer: Medicare Other | Source: Ambulatory Visit | Attending: Oncology | Admitting: Oncology

## 2020-10-18 ENCOUNTER — Other Ambulatory Visit: Payer: Self-pay

## 2020-10-18 ENCOUNTER — Other Ambulatory Visit: Payer: Self-pay | Admitting: *Deleted

## 2020-10-18 ENCOUNTER — Ambulatory Visit: Payer: Medicare Other

## 2020-10-18 DIAGNOSIS — Z9189 Other specified personal risk factors, not elsewhere classified: Secondary | ICD-10-CM

## 2020-10-18 DIAGNOSIS — Z170 Estrogen receptor positive status [ER+]: Secondary | ICD-10-CM

## 2020-10-18 DIAGNOSIS — C50511 Malignant neoplasm of lower-outer quadrant of right female breast: Secondary | ICD-10-CM

## 2020-10-18 MED ORDER — GADOBUTROL 1 MMOL/ML IV SOLN
6.0000 mL | Freq: Once | INTRAVENOUS | Status: AC | PRN
  Administered 2020-10-18: 6 mL via INTRAVENOUS

## 2020-10-24 NOTE — Progress Notes (Signed)
[facility]  [phone] [phone]     ID: Katie Marquez DOB: 04/03/1955  MR#: [id number]  TFT#:[id number]  Patient Care Team: Jonathon Jordan, MD as PCP - General (Family Medicine) Tavionna Grout, Virgie Dad, MD as Consulting Physician (Oncology) Richmond Campbell, MD as Consulting Physician (Gastroenterology) Eppie Gibson, MD as Attending Physician (Radiation Oncology) Lovey Newcomer, MD as Attending Physician (Radiology) Bensimhon, Shaune Pascal, MD as Consulting Physician (Cardiology) Molli Posey, MD as Consulting Physician (Obstetrics and Gynecology) Hennie Duos, MD as Consulting Physician (Rheumatology) Rolm Bookbinder, MD as Consulting Physician (General Surgery) OTHER MD:   CHIEF COMPLAINT: Triple positive breast cancer  CURRENT TREATMENT: tamoxifen; intensified screening   INTERVAL HISTORY: Katie Marquez returns today for follow-up of her triple positive breast cancer.   She continues on tamoxifen.  Hot flashes and vaginal wetness are not issues.  She has gained a little bit of weight particularly around her midriff and she wonders if tamoxifen can be the cause of this.  Since her last visit, she underwent bilateral diagnostic mammography with tomography at [facility] on 03/30/2020 showing: breast density category C; no evidence of malignancy in either breast.  She also underwent breast MRI on 10/18/2020 showing: breast composition C; no evidence of malignancy in either breast.   REVIEW OF SYSTEMS: [name] is getting back to the [facility] has plans for swimming pickleball and yoga.  She and her husband have a place in [place] near their daughter and they visit the baby who is currently 1 and 63 months old.  A detailed review of systems is otherwise stable and she is doing "great" overall.   COVID 19 VACCINATION STATUS: fully vaccinated [employer]), with booster September 2021 and September 2022   HISTORY OF CURRENT ILLNESS: From the original  intake note:  Katie Marquez had bilateral screening mammography with tomography at the breast center 12/12/2016, showing a possible mass in the right breast.  Right breast diagnostic mammography with tomography and right breast ultrasonography 12/15/2016 found the breast density to be category D.  In the lower right breast there was a small lobulated mass which was not palpable.  Ultrasound showed a 0.8 cm mass in the right breast 7:00 radiant 2 cm from the nipple.  The right axilla was benign.  Biopsy of the right breast mass in question 12/15/2016 showed ([id number]) and invasive ductal carcinoma, grade 3, estrogen receptor at 95% positive, progesterone receptor 50% positive, both with strong staining intensity, with an MIB-1 of 15%, and HER-2 amplification, the signals ratio being 1.83 but the number per cell 10.23.  The patient's subsequent history is as detailed below.   PAST MEDICAL HISTORY: Past Medical History:  Diagnosis Date   Anxiety    Cancer [facility])    breast cancer   Diverticulosis    Family history of kidney cancer    Family history of thyroid cancer    Heart murmur    History of radiation therapy 05/09/17- 06/06/17   40.05 Gy directed to the right breast in 15 fractions followed by boost of 10 Gy in 5 fractions.    Hypertension    IBS (irritable bowel syndrome)    Iron deficiency    Osteoarthritis    Personal history of chemotherapy    Personal history of radiation therapy    PONV (postoperative nausea and vomiting)    Tremors of nervous system    essential tremors    PAST SURGICAL HISTORY: Past Surgical History:  Procedure Laterality Date   BREAST LUMPECTOMY  BREAST LUMPECTOMY WITH RADIOACTIVE SEED AND SENTINEL LYMPH NODE BIOPSY Right 01/08/2017   Procedure: RIGHT BREAST LUMPECTOMY WITH RADIOACTIVE SEED AND RIGHT SENTINEL LYMPH NODE BIOPSY ERAS PATHWAY;  Surgeon: Fanny Skates, MD;  Location: [facility];  Service: General;  Laterality: Right;  PEC BLOCK   LAPAROSCOPIC  SIGMOID COLECTOMY     LAPAROSCOPY     PORTACATH PLACEMENT Left 01/08/2017   Procedure: INSERTION PORT-A-CATH WITH ULTRA SOUND;  Surgeon: Fanny Skates, MD;  Location: [facility];  Service: General;  Laterality: Left;    FAMILY HISTORY Family History  Problem Relation Age of Onset   Rheum arthritis Mother    Hyperlipidemia Mother    Hypertension Mother    Thyroid cancer Mother 57   Diverticulitis Father    Parkinson's disease Father    Kidney cancer Father 52   Multiple sclerosis Maternal Aunt    Breast cancer Neg Hx   Katie Marquez comes from an [place] background.  Her father died at 36 with Parkinson's disease and complications from tobacco abuse including a history of renal cancer; the patient's mother died at age 72, with a history of rheumatoid arthritis and thyroid cancer.  The patient had no brothers, 1 sister, in good health.  There is no history of breast or ovarian cancer in the family   GYNECOLOGIC HISTORY:  No LMP recorded. Patient is postmenopausal. Menarche age 19, first live birth age 89.  The patient is GX P1.  She had infertility treatments after her first live birth but they were not successful.  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  They retired in 2021.  Their daughter Katie Marquez lives in [place] and previously attended [employer] and then the [employer] in business and psychology. She married in September 2019, her husband is in finance, and they had a daughter December 2020.    ADVANCED DIRECTIVES: In the absence of any documentation to the contrary, the patient's spouse is their HCPOA.    HEALTH MAINTENANCE: Social History   Tobacco Use   Smoking status: Never   Smokeless tobacco: Never  Vaping Use   Vaping Use: Never used   Substance Use Topics   Alcohol use: Yes    Comment: occas   Drug use: No     Colonoscopy: [facility]  PAP: [place]  Bone density: Osteopenia/ [name] around 2017   No Known Allergies  Current Outpatient Medications  Medication Sig Dispense Refill   MINOXIDIL, TOPICAL, 5 % SOLN Apply topically.  0   atorvastatin (LIPITOR) 10 MG tablet Take 1 tablet (10 mg total) by mouth daily.     CALCIUM PO Take by mouth daily.     cholecalciferol (VITAMIN D) 1000 units tablet Take 2,000 Units by mouth daily.     FLUoxetine (PROZAC) 10 MG capsule Take 20 mg by mouth daily.      minoxidil (ROGAINE) 2 % external solution Apply topically 2 (two) times daily. 60 mL 0   Probiotic Product (PROBIOTIC-10 PO) Take by mouth.     propranolol (INDERAL) 20 MG tablet Take 1 tablet (20 mg total) by mouth 2 (two) times daily.     tamoxifen (NOLVADEX) 20 MG tablet Take 1 tablet (20 mg total) by mouth daily. 90 tablet 4   No current facility-administered medications for this visit.  OBJECTIVE: White woman who appears younger than stated age  65:   10/25/20 1051  BP: (!) 135/95  Pulse: 65  Resp: 18  Temp: 97.7 F (36.5 C)  SpO2: 98%   Wt Readings from Last 3 Encounters:  10/25/20 121 lb 12.8 oz (55.2 kg)  03/29/20 123 lb 4.8 oz (55.9 kg)  10/28/19 119 lb (54 kg)   Body mass index is 22.28 kg/m.    ECOG FS:0 - Asymptomatic  Sclerae unicteric, EOMs intact Wearing a mask No cervical or supraclavicular adenopathy Lungs no rales or rhonchi Heart regular rate and rhythm Abd soft, nontender, positive bowel sounds MSK no focal spinal tenderness, no upper extremity lymphedema Neuro: nonfocal, well oriented, appropriate affect Breasts: The right breast has undergone lumpectomy and radiation.  The cosmetic result is excellent.  There is no evidence of recurrent disease.  The left breast and both axillae are benign.   LAB RESULTS:  CMP     Component Value Date/Time   NA 141 10/25/2020 1035    NA 141 01/31/2017 1225   K 4.2 10/25/2020 1035   K 4.1 01/31/2017 1225   CL 106 10/25/2020 1035   CO2 26 10/25/2020 1035   CO2 29 01/31/2017 1225   GLUCOSE 74 10/25/2020 1035   GLUCOSE 86 01/31/2017 1225   BUN 20 10/25/2020 1035   BUN 17.4 01/31/2017 1225   CREATININE 0.83 10/25/2020 1035   CREATININE 0.8 01/31/2017 1225   CALCIUM 9.4 10/25/2020 1035   CALCIUM 10.0 01/31/2017 1225   PROT 6.4 (L) 10/25/2020 1035   PROT 7.2 01/31/2017 1225   ALBUMIN 3.5 10/25/2020 1035   ALBUMIN 4.0 01/31/2017 1225   AST 14 (L) 10/25/2020 1035   AST 18 01/31/2017 1225   ALT 10 10/25/2020 1035   ALT 29 01/31/2017 1225   ALKPHOS 45 10/25/2020 1035   ALKPHOS 66 01/31/2017 1225   BILITOT 0.4 10/25/2020 1035   BILITOT 0.57 01/31/2017 1225   GFRNONAA >60 10/25/2020 1035   GFRAA >60 10/28/2019 1203    No results found for: TOTALPROTELP, ALBUMINELP, A1GS, A2GS, BETS, BETA2SER, GAMS, MSPIKE, SPEI  No results found for: Nils Pyle, [facility]  Lab Results  Component Value Date   WBC 7.3 10/25/2020   NEUTROABS 4.7 10/25/2020   HGB 13.9 10/25/2020   HCT 41.3 10/25/2020   MCV 85.2 10/25/2020   PLT 231 10/25/2020      Chemistry      Component Value Date/Time   NA 141 10/25/2020 1035   NA 141 01/31/2017 1225   K 4.2 10/25/2020 1035   K 4.1 01/31/2017 1225   CL 106 10/25/2020 1035   CO2 26 10/25/2020 1035   CO2 29 01/31/2017 1225   BUN 20 10/25/2020 1035   BUN 17.4 01/31/2017 1225   CREATININE 0.83 10/25/2020 1035   CREATININE 0.8 01/31/2017 1225      Component Value Date/Time   CALCIUM 9.4 10/25/2020 1035   CALCIUM 10.0 01/31/2017 1225   ALKPHOS 45 10/25/2020 1035   ALKPHOS 66 01/31/2017 1225   AST 14 (L) 10/25/2020 1035   AST 18 01/31/2017 1225   ALT 10 10/25/2020 1035   ALT 29 01/31/2017 1225   BILITOT 0.4 10/25/2020 1035   BILITOT 0.57 01/31/2017 1225       No results found for: LABCA2  No components found for: MVHQIO962  No results for input(s): INR  in the last 168 hours.  No results found for: LABCA2  No results found for: XBM841  No results found  for: XJO832  No results found for: PQD826  No results found for: CA2729  No components found for: HGQUANT  No results found for: CEA1 / No results found for: CEA1   No results found for: AFPTUMOR  No results found for: CHROMOGRNA  No results found for: HGBA, HGBA2QUANT, HGBFQUANT, HGBSQUAN (Hemoglobinopathy evaluation)   No results found for: LDH  No results found for: IRON, TIBC, IRONPCTSAT (Iron and TIBC)  No results found for: FERRITIN  Urinalysis No results found for: COLORURINE, APPEARANCEUR, LABSPEC, PHURINE, GLUCOSEU, HGBUR, BILIRUBINUR, KETONESUR, PROTEINUR, UROBILINOGEN, NITRITE, LEUKOCYTESUR   STUDIES: MR BREAST BILATERAL W WO CONTRAST INC CAD  Result Date: 10/19/2020 CLINICAL DATA:  High risk screening. History of right breast cancer in 2018 status post lumpectomy. LABS:  None. EXAM: BILATERAL BREAST MRI WITH AND WITHOUT CONTRAST TECHNIQUE: Multiplanar, multisequence MR images of both breasts were obtained prior to and following the intravenous administration of 6 ml of Gadavist Three-dimensional MR images were rendered by post-processing of the original MR data on an independent workstation. The three-dimensional MR images were interpreted, and findings are reported in the following complete MRI report for this study. Three dimensional images were evaluated at the independent interpreting workstation using the DynaCAD thin client. COMPARISON:  Previous exam(s). FINDINGS: Breast composition: c. Heterogeneous fibroglandular tissue. Background parenchymal enhancement: Minimal Right breast: There are postsurgical changes with associated susceptibility artifact in the central inferior breast. Nonenhancing skin thickening consistent with post radiation change. No mass or abnormal enhancement. Left breast: No mass or abnormal enhancement. Lymph nodes: No abnormal appearing  lymph nodes. Ancillary findings:  None. IMPRESSION: No MRI evidence of malignancy in either breast. RECOMMENDATION: 1.  Continue routine annual screening mammography. 2. According to the 2018 ACR recommendations, annual surveillance MRI is recommended in women with personal histories of breast cancer and dense breast tissue, or those diagnosed before age 2. (Monticciolo DL, et al. (2018) Breast cancer screening in women at higher-than-average risk: recommendations from the ACR. [place] 41:583-094). BI-RADS CATEGORY  2: Benign. Electronically Signed   By: Audie Pinto M.D.   On: 10/19/2020 07:58    ELIGIBLE FOR AVAILABLE RESEARCH PROTOCOL: no  ASSESSMENT: 65 y.o. [place] woman status post right breast lower outer quadrant biopsy 12/15/2016 for a clinical T1b N0, stage IA invasive ductal carcinoma, grade 3, triple positive, with an MIB-1 of 15%  (1) status post right lumpectomy 01/08/2017 for a  pT1c pN0, stage IA invasive ductal carcinoma, grade 2, with negative margins.    (2) adjuvant chemotherapy consisting of paclitaxel weekly x12, together with trastuzumab every 21 days started 01/31/2017  (3) trastuzumab continued to total 1 year (through December 2019)  (a) echocardiogram 01/17/2017 demonstrates a LVEF of 65-70%  (b) echocardiogram 04/19/2017 showed an ejection fraction in the 60-65% range  (c) echocardiogram 07/20/2017 showed an ejection fraction in the 55-60% range  (d) echocardiogram 10/26/2017 showed an ejection fraction in the 60-65% range  (4) adjuvant radiation completed 06/06/2017 Site/dose:   40.05 Gy directed to the right breast in 15 fractions followed by boost of 10 Gy in 5 fractions  (5) started tamoxifen 06/30/2017  (6) genetics testing completed on 02/20/2017 offered through [facility] showed: no deleterious mutations. The following genes were evaluated for sequence changes and exonic deletions/duplications: APC, ATM, AXIN2, BAP1, BARD1, BMPR1A, BRCA1, BRCA2,  BRIP1, BUB1B, CDC73, CDH1, CDK4, CDKN1C, CDKN2A (p14ARF), CDKN2A (p16INK4a), CEP57, CHEK2, CTNNA1, DICER1, DIS3L2, EPCAM*, FH, FLCN, GPC3, GREM1*, KIT, MEN1, MET, MLH1, MSH2, MSH3, MSH6, MUTYH, NBN, NF1, PALB2,  PDGFRA, PMS2, POLD1, POLE, PTEN, RAD50, RAD51C, RAD51D, SDHB,SDHC, SDHD, SMAD4, SMARCA4, SMARCB1, STK11, TP53, TSC1, TSC2, VHL, WT1. The following genes were evaluated for sequence changes only: HOXB13*, MITF*, NTHL1*, SDHA. Results are negative unless otherwise indicated  (7) intensified screening: Breast density category D, first live birth after age 15, Ashkenazi background (>20% lifetime)  (a) mammography  yearly  (b) breast MRI  yearly until breast density drops to B on 2 separate determinations   PLAN: Katie Marquez is now close to 4 years out from definitive surgery for her breast cancer with no evidence of disease recurrence.  This is very favorable.  She is tolerating tamoxifen well and the plan is to continue that a total of 5 years.  We are also doing intensified breast screening.  I have set her up for mammography in March and an MRI next September.  I have suggested that she may want to call and ask what the out-of-pocket cost will be because it is easier to fix a mammogram from screening to diagnostic or the other way up before it is done unbilled and after.  She was able to obtain a full MRI this year at no cost but the billing had to be revised  She is concerned about weight issues.  She is going to be starting a good exercise program.  Most of the weight change though is calories and the simple formula calories and minus calories out equals weight change is brutally simple.  My suggestion was that she cut back on the carbs, namely potatoes past rice and bread and probably with her activity level about to increase that will be in  I reassured her that tamoxifen does not cause weight gain--we have randomized double blinded data confirming that.  It can however contribute to hair loss and  it is possible that when she stops tamoxifen after 5years her hair may become thicker.  I did give her information of the new oral minoxidil version and she may want to discuss that with her dermatologist Dr. Renda Rolls as Katie Marquez is not getting that much traction from the minoxidil lotion she has been using  She will see [place] again in a year.  She knows to call for any other issue that may develop before then.  Total encounter time 25 minutes.*   My Rinke, Virgie Dad, MD  10/25/20 11:21 AM Medical Oncology and Hematology [facility] [place], [place] [address] Tel. [phone]    Fax. [phone]   I, Wilburn Mylar, am acting as scribe for Dr. Virgie Dad. Sarae Nicholes.  I, Lurline Del MD, have reviewed the above documentation for accuracy and completeness, and I agree with the above.   *Total Encounter Time as defined by the Centers for Medicare and Medicaid Services includes, in addition to the face-to-face time of a patient visit (documented in the note above) non-face-to-face time: obtaining and reviewing outside history, ordering and reviewing medications, tests or procedures, care coordination (communications with other health care professionals or caregivers) and documentation in the medical record.

## 2020-10-25 ENCOUNTER — Other Ambulatory Visit: Payer: Self-pay | Admitting: *Deleted

## 2020-10-25 ENCOUNTER — Inpatient Hospital Stay: Payer: Medicare Other

## 2020-10-25 ENCOUNTER — Inpatient Hospital Stay: Payer: Medicare Other | Attending: Oncology | Admitting: Oncology

## 2020-10-25 ENCOUNTER — Other Ambulatory Visit: Payer: Self-pay

## 2020-10-25 VITALS — BP 135/95 | HR 65 | Temp 97.7°F | Resp 18 | Ht 62.0 in | Wt 121.8 lb

## 2020-10-25 DIAGNOSIS — Z9221 Personal history of antineoplastic chemotherapy: Secondary | ICD-10-CM | POA: Insufficient documentation

## 2020-10-25 DIAGNOSIS — Z170 Estrogen receptor positive status [ER+]: Secondary | ICD-10-CM

## 2020-10-25 DIAGNOSIS — M1990 Unspecified osteoarthritis, unspecified site: Secondary | ICD-10-CM | POA: Insufficient documentation

## 2020-10-25 DIAGNOSIS — Z923 Personal history of irradiation: Secondary | ICD-10-CM | POA: Diagnosis not present

## 2020-10-25 DIAGNOSIS — C50511 Malignant neoplasm of lower-outer quadrant of right female breast: Secondary | ICD-10-CM

## 2020-10-25 DIAGNOSIS — Z8051 Family history of malignant neoplasm of kidney: Secondary | ICD-10-CM | POA: Diagnosis not present

## 2020-10-25 DIAGNOSIS — Z79810 Long term (current) use of selective estrogen receptor modulators (SERMs): Secondary | ICD-10-CM | POA: Diagnosis not present

## 2020-10-25 DIAGNOSIS — Z79899 Other long term (current) drug therapy: Secondary | ICD-10-CM | POA: Diagnosis not present

## 2020-10-25 DIAGNOSIS — I10 Essential (primary) hypertension: Secondary | ICD-10-CM | POA: Diagnosis not present

## 2020-10-25 DIAGNOSIS — Z808 Family history of malignant neoplasm of other organs or systems: Secondary | ICD-10-CM | POA: Diagnosis not present

## 2020-10-25 LAB — CBC WITH DIFFERENTIAL (CANCER CENTER ONLY)
Abs Immature Granulocytes: 0.01 10*3/uL (ref 0.00–0.07)
Basophils Absolute: 0.1 10*3/uL (ref 0.0–0.1)
Basophils Relative: 1 %
Eosinophils Absolute: 0.2 10*3/uL (ref 0.0–0.5)
Eosinophils Relative: 3 %
HCT: 41.3 % (ref 36.0–46.0)
Hemoglobin: 13.9 g/dL (ref 12.0–15.0)
Immature Granulocytes: 0 %
Lymphocytes Relative: 22 %
Lymphs Abs: 1.6 10*3/uL (ref 0.7–4.0)
MCH: 28.7 pg (ref 26.0–34.0)
MCHC: 33.7 g/dL (ref 30.0–36.0)
MCV: 85.2 fL (ref 80.0–100.0)
Monocytes Absolute: 0.7 10*3/uL (ref 0.1–1.0)
Monocytes Relative: 10 %
Neutro Abs: 4.7 10*3/uL (ref 1.7–7.7)
Neutrophils Relative %: 64 %
Platelet Count: 231 10*3/uL (ref 150–400)
RBC: 4.85 MIL/uL (ref 3.87–5.11)
RDW: 12.9 % (ref 11.5–15.5)
WBC Count: 7.3 10*3/uL (ref 4.0–10.5)
nRBC: 0 % (ref 0.0–0.2)

## 2020-10-25 LAB — CMP (CANCER CENTER ONLY)
ALT: 10 U/L (ref 0–44)
AST: 14 U/L — ABNORMAL LOW (ref 15–41)
Albumin: 3.5 g/dL (ref 3.5–5.0)
Alkaline Phosphatase: 45 U/L (ref 38–126)
Anion gap: 9 (ref 5–15)
BUN: 20 mg/dL (ref 8–23)
CO2: 26 mmol/L (ref 22–32)
Calcium: 9.4 mg/dL (ref 8.9–10.3)
Chloride: 106 mmol/L (ref 98–111)
Creatinine: 0.83 mg/dL (ref 0.44–1.00)
GFR, Estimated: >60 mL/min (ref >60)
Glucose, Bld: 74 mg/dL (ref 70–99)
Potassium: 4.2 mmol/L (ref 3.5–5.1)
Sodium: 141 mmol/L (ref 135–145)
Total Bilirubin: 0.4 mg/dL (ref 0.3–1.2)
Total Protein: 6.4 g/dL — ABNORMAL LOW (ref 6.5–8.1)

## 2020-10-25 MED ORDER — TAMOXIFEN CITRATE 20 MG PO TABS: 20.0000 mg | ORAL_TABLET | Freq: Every day | ORAL | 4 refills | Status: AC

## 2021-02-10 ENCOUNTER — Telehealth: Payer: Self-pay | Admitting: *Deleted

## 2021-02-10 NOTE — Telephone Encounter (Signed)
This RN received call from [name] NP with [facility] - inquiring clearance for use of spironolactone for hair loss. Dosing would be 100 mg a day.  Pt is reluctant to use of minoxidil.  Per review with Dr Lindi Adie- pt may try a trial of above with caution and monitoring for breast swelling or tenderness.  This RN attempted to return call to [name] with no answer- return call given as [phone]

## 2021-02-11 ENCOUNTER — Telehealth: Payer: Self-pay

## 2021-02-11 NOTE — Telephone Encounter (Signed)
Katie Marquez, [place] returned call from conversation with Val, Therapist, sports. Advised PA, per Dr Lindi Adie OK to use medication but to monitor for breast changes. [name] knows to call with any further questions/concerns for pt.

## 2021-03-31 ENCOUNTER — Ambulatory Visit
Admission: RE | Admit: 2021-03-31 | Discharge: 2021-03-31 | Disposition: A | Discharge status: Home / Self Care | Payer: Medicare Other | Source: Ambulatory Visit | Attending: Oncology | Admitting: Oncology

## 2021-03-31 ENCOUNTER — Other Ambulatory Visit: Payer: Self-pay | Admitting: Hematology and Oncology

## 2021-03-31 DIAGNOSIS — C50511 Malignant neoplasm of lower-outer quadrant of right female breast: Secondary | ICD-10-CM

## 2021-04-18 ENCOUNTER — Telehealth: Payer: Self-pay | Admitting: *Deleted

## 2021-04-18 NOTE — Telephone Encounter (Signed)
Received VM from pt.  RN attempt x1 to return call.  No answer, LVM for pt to return call to the office.  °

## 2021-10-06 ENCOUNTER — Ambulatory Visit
Admission: RE | Admit: 2021-10-06 | Discharge: 2021-10-06 | Disposition: A | Discharge status: Home / Self Care | Payer: Medicare Other | Source: Ambulatory Visit | Attending: Oncology | Admitting: Oncology

## 2021-10-06 DIAGNOSIS — Z170 Estrogen receptor positive status [ER+]: Secondary | ICD-10-CM

## 2021-10-06 MED ORDER — GADOPICLENOL 0.5 MMOL/ML IV SOLN
5.0000 mL | Freq: Once | INTRAVENOUS | Status: AC | PRN
  Administered 2021-10-06: 5 mL via INTRAVENOUS

## 2021-10-22 NOTE — Progress Notes (Signed)
Patient Care Team: Jonathon Jordan, MD as PCP - General (Family Medicine) Magrinat, Virgie Dad, MD (Inactive) as Consulting Physician (Oncology) Richmond Campbell, MD as Consulting Physician (Gastroenterology) Eppie Gibson, MD as Attending Physician (Radiation Oncology) Lovey Newcomer, MD as Attending Physician (Radiology) Bensimhon, Shaune Pascal, MD as Consulting Physician (Cardiology) Molli Posey, MD as Consulting Physician (Obstetrics and Gynecology) Hennie Duos, MD as Consulting Physician (Rheumatology) Rolm Bookbinder, MD as Consulting Physician (General Surgery) [place], Jennefer Bravo, MD as Referring Physician (Dermatology)  DIAGNOSIS: No diagnosis found.  SUMMARY OF ONCOLOGIC HISTORY: Oncology History  Carcinoma of lower-outer quadrant of right breast in female, estrogen receptor positive ([place])  12/15/2016 Initial Diagnosis   [place] woman status post right breast lower outer quadrant biopsy for a clinical T1b N0, stage IA invasive ductal carcinoma, grade 3, triple positive, with an MIB-1 of 15%   01/08/2017 Surgery   status post right lumpectomy 01/08/2017 for a  pT1c pN0, stage IA invasive ductal carcinoma, grade 2, with negative margins.     01/31/2017 - 12/2017 Adjuvant Chemotherapy   adjuvant chemotherapy consisting of paclitaxel weekly x12, together with trastuzumab every 21 days started 01/31/2017   trastuzumab continued to total 1 year (through December 2019)             (a) echocardiogram 01/17/2017 demonstrates a LVEF of 65-70%             (b) echocardiogram 04/19/2017 showed an ejection fraction in the 60-65% range             (c) echocardiogram 07/20/2017 showed an ejection fraction in the 55-60% range             (d) echocardiogram 10/26/2017 showed an ejection fraction in the 60-65% range    03/07/2017 Genetic Testing   Negative genetic testing on a 60 gene panel that reviewed genes associated with common hereditary cancer syndromes and renal cancer  syndromes.  The Hereditary Gene Panel offered by [facility] includes sequencing and/or deletion duplication testing of the following 47 genes: APC, ATM, AXIN2, BARD1, BMPR1A, BRCA1, BRCA2, BRIP1, CDH1, CDK4, CDKN2A (p14ARF), CDKN2A (p16INK4a), CHEK2, CTNNA1, DICER1, EPCAM (Deletion/duplication testing only), GREM1 (promoter region deletion/duplication testing only), KIT, MEN1, MLH1, MSH2, MSH3, MSH6, MUTYH, NBN, NF1, NHTL1, PALB2, PDGFRA, PMS2, POLD1, POLE, PTEN, RAD50, RAD51C, RAD51D, SDHB, SDHC, SDHD, SMAD4, SMARCA4. STK11, TP53, TSC1, TSC2, and VHL.  The following genes were evaluated for sequence changes only: SDHA and HOXB13 c.251G>A variant only. The Renal/Urinary Tract cancer panel offered by [facility] includes sequencing and/or deletion/duplication testing of the following 30 genes: BAP1, BUB1B, CDC73, CEP57, CDKN1C, DICER1, DIS3L2,  EPCAM, FH, FLCN, GPC3, MET, MITF, MLH1, MSH2, MSH6, PALB2, PMS2, PTEN, SDHA, SDHB, SDHC, SDHD, SMARCA4, SMARCB1, TP53, TSC1, TSC2, VHL, and WT1. The report date is March 07, 2017.    05/09/2017 - 06/06/2017 Radiation Therapy   Site/dose:   40.05 Gy directed to the right breast in 15 fractions followed by boost of 10 Gy in 5 fractions   06/2017 -  Anti-estrogen oral therapy   Tamoxifen daily     CHIEF COMPLIANT: Follow-up breast cancer on tamoxifen    INTERVAL HISTORY: Katie Marquez is a 66 y.o with the above mentioned. She presents to the clinic for a follow-up.    ALLERGIES:  has No Known Allergies.  MEDICATIONS:  Current Outpatient Medications  Medication Sig Dispense Refill   atorvastatin (LIPITOR) 10 MG tablet Take 1 tablet (10 mg total) by mouth daily.     CALCIUM PO Take by  mouth daily.     cholecalciferol (VITAMIN D) 1000 units tablet Take 2,000 Units by mouth daily.     FLUoxetine (PROZAC) 10 MG capsule Take 20 mg by mouth daily.      minoxidil (ROGAINE) 2 % external solution Apply topically 2 (two) times daily. 60 mL 0   MINOXIDIL, TOPICAL, 5 %  SOLN Apply topically.  0   Probiotic Product (PROBIOTIC-10 PO) Take by mouth.     propranolol (INDERAL) 20 MG tablet Take 1 tablet (20 mg total) by mouth 2 (two) times daily.     tamoxifen (NOLVADEX) 20 MG tablet Take 1 tablet (20 mg total) by mouth daily. 90 tablet 4   No current facility-administered medications for this visit.    PHYSICAL EXAMINATION: ECOG PERFORMANCE STATUS: {CHL ONC ECOG [id number]}  There were no vitals filed for this visit. There were no vitals filed for this visit.  BREAST:*** No palpable masses or nodules in either right or left breasts. No palpable axillary supraclavicular or infraclavicular adenopathy no breast tenderness or nipple discharge. (exam performed in the presence of a chaperone)  LABORATORY DATA:  I have reviewed the data as listed    Latest Ref Rng & Units 10/25/2020   10:35 AM 03/29/2020   11:05 AM 10/28/2019   12:03 PM  CMP  Glucose 70 - 99 mg/dL 74  80  83   BUN 8 - 23 mg/dL $Remove'20  18  16   'PTxuYHl$ Creatinine 0.44 - 1.00 mg/dL 0.83  0.87  0.80   Sodium 135 - 145 mmol/L 141  139  137   Potassium 3.5 - 5.1 mmol/L 4.2  4.4  4.4   Chloride 98 - 111 mmol/L 106  106  103   CO2 22 - 32 mmol/L $RemoveB'26  29  30   'wIRqhNRF$ Calcium 8.9 - 10.3 mg/dL 9.4  9.4  9.3   Total Protein 6.5 - 8.1 g/dL 6.4  6.8  6.8   Total Bilirubin 0.3 - 1.2 mg/dL 0.4  0.4  0.4   Alkaline Phos 38 - 126 U/L 45  52  45   AST 15 - 41 U/L $Remo'14  15  15   'zfPZT$ ALT 0 - 44 U/L $Remo'10  11  13     'hXHvT$ Lab Results  Component Value Date   WBC 7.3 10/25/2020   HGB 13.9 10/25/2020   HCT 41.3 10/25/2020   MCV 85.2 10/25/2020   PLT 231 10/25/2020   NEUTROABS 4.7 10/25/2020    ASSESSMENT & PLAN:  No problem-specific Assessment & Plan notes found for this encounter.    No orders of the defined types were placed in this encounter.  The patient has a good understanding of the overall plan. she agrees with it. she will call with any problems that may develop before the next visit here. Total time spent: 30 mins  including face to face time and time spent for planning, charting and co-ordination of care   Suzzette Righter, [place] 10/22/21    I Gardiner Coins am scribing for Dr. Lindi Adie  ***

## 2021-10-24 ENCOUNTER — Other Ambulatory Visit: Payer: Self-pay

## 2021-10-24 DIAGNOSIS — C50511 Malignant neoplasm of lower-outer quadrant of right female breast: Secondary | ICD-10-CM

## 2021-10-25 ENCOUNTER — Encounter: Payer: Self-pay | Admitting: *Deleted

## 2021-10-25 ENCOUNTER — Inpatient Hospital Stay: Payer: Medicare Other | Attending: Hematology and Oncology

## 2021-10-25 ENCOUNTER — Inpatient Hospital Stay (HOSPITAL_BASED_OUTPATIENT_CLINIC_OR_DEPARTMENT_OTHER): Payer: Medicare Other | Admitting: Hematology and Oncology

## 2021-10-25 ENCOUNTER — Other Ambulatory Visit: Payer: Self-pay

## 2021-10-25 DIAGNOSIS — C50511 Malignant neoplasm of lower-outer quadrant of right female breast: Secondary | ICD-10-CM

## 2021-10-25 DIAGNOSIS — Z79810 Long term (current) use of selective estrogen receptor modulators (SERMs): Secondary | ICD-10-CM | POA: Insufficient documentation

## 2021-10-25 DIAGNOSIS — Z170 Estrogen receptor positive status [ER+]: Secondary | ICD-10-CM

## 2021-10-25 DIAGNOSIS — Z853 Personal history of malignant neoplasm of breast: Secondary | ICD-10-CM | POA: Diagnosis present

## 2021-10-25 LAB — CMP (CANCER CENTER ONLY)
ALT: 15 U/L (ref 0–44)
AST: 14 U/L — ABNORMAL LOW (ref 15–41)
Albumin: 3.8 g/dL (ref 3.5–5.0)
Alkaline Phosphatase: 35 U/L — ABNORMAL LOW (ref 38–126)
Anion gap: 5 (ref 5–15)
BUN: 24 mg/dL — ABNORMAL HIGH (ref 8–23)
CO2: 26 mmol/L (ref 22–32)
Calcium: 9.3 mg/dL (ref 8.9–10.3)
Chloride: 104 mmol/L (ref 98–111)
Creatinine: 0.87 mg/dL (ref 0.44–1.00)
GFR, Estimated: >60 mL/min (ref >60)
Glucose, Bld: 109 mg/dL — ABNORMAL HIGH (ref 70–99)
Potassium: 4.2 mmol/L (ref 3.5–5.1)
Sodium: 135 mmol/L (ref 135–145)
Total Bilirubin: 0.5 mg/dL (ref 0.3–1.2)
Total Protein: 6.5 g/dL (ref 6.5–8.1)

## 2021-10-25 LAB — CBC WITH DIFFERENTIAL (CANCER CENTER ONLY)
Abs Immature Granulocytes: 0.05 10*3/uL (ref 0.00–0.07)
Basophils Absolute: 0.1 10*3/uL (ref 0.0–0.1)
Basophils Relative: 0 %
Eosinophils Absolute: 0.1 10*3/uL (ref 0.0–0.5)
Eosinophils Relative: 1 %
HCT: 40.7 % (ref 36.0–46.0)
Hemoglobin: 14 g/dL (ref 12.0–15.0)
Immature Granulocytes: 0 %
Lymphocytes Relative: 14 %
Lymphs Abs: 1.8 10*3/uL (ref 0.7–4.0)
MCH: 30.3 pg (ref 26.0–34.0)
MCHC: 34.4 g/dL (ref 30.0–36.0)
MCV: 88.1 fL (ref 80.0–100.0)
Monocytes Absolute: 1.1 10*3/uL — ABNORMAL HIGH (ref 0.1–1.0)
Monocytes Relative: 8 %
Neutro Abs: 9.8 10*3/uL — ABNORMAL HIGH (ref 1.7–7.7)
Neutrophils Relative %: 77 %
Platelet Count: 294 10*3/uL (ref 150–400)
RBC: 4.62 MIL/uL (ref 3.87–5.11)
RDW: 12.6 % (ref 11.5–15.5)
WBC Count: 12.9 10*3/uL — ABNORMAL HIGH (ref 4.0–10.5)
nRBC: 0 % (ref 0.0–0.2)

## 2021-10-25 NOTE — Assessment & Plan Note (Addendum)
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  Mammogram: 03/31/2021: Benign breast density category C 2. breast MRI 10/06/2021: Benign breast density category C  Return to clinic in 1 year for follow-up

## 2021-10-25 NOTE — Progress Notes (Signed)
Per MD request RN successfully faxed [facility] request ([phone]). 

## 2021-10-26 ENCOUNTER — Telehealth: Payer: Self-pay | Admitting: Hematology and Oncology

## 2021-10-26 NOTE — Telephone Encounter (Signed)
Patient had labs done yesterday and we did not discuss it while she was in the clinic. She had elevated white blood cell count of 12.9 which are primarily elevated neutrophils.  I left a voicemail for the patient stating that this indicates underlying inflammation.  She apparently was having some abdominal pain and was questioning if she has a diverticulitis episode.  I will left a voicemail for her to call her gastroenterologist to have a discussion about her abdominal symptoms.

## 2021-11-03 ENCOUNTER — Telehealth: Payer: Self-pay | Admitting: Hematology and Oncology

## 2021-11-03 MED ORDER — ANASTROZOLE 1 MG PO TABS: 1.0000 mg | ORAL_TABLET | Freq: Every day | ORAL | 3 refills | Status: AC

## 2021-11-03 NOTE — Telephone Encounter (Signed)
I discussed the results of [facility] which showed that she would benefit from extended endocrine therapy.  We discussed tamoxifen versus anastrozole and she decided to switch to anastrozole based upon better efficacy and and favorable side effect profile.  I sent a prescription for anastrozole. Telephone visit in 3 months to discuss tolerance to anastrozole.

## 2021-11-04 ENCOUNTER — Encounter: Payer: Self-pay | Admitting: Hematology and Oncology

## 2021-11-08 ENCOUNTER — Telehealth: Payer: Self-pay | Admitting: Hematology and Oncology

## 2021-11-08 NOTE — Telephone Encounter (Signed)
Scheduled appointment per 10/5 staff message. Patient is aware. 

## 2021-11-09 ENCOUNTER — Encounter (HOSPITAL_COMMUNITY): Payer: Self-pay

## 2021-11-09 ENCOUNTER — Telehealth: Payer: Self-pay | Admitting: Hematology and Oncology

## 2021-11-09 NOTE — Telephone Encounter (Signed)
Discussed breast cancer index test

## 2022-02-16 ENCOUNTER — Other Ambulatory Visit: Payer: Self-pay | Admitting: Family Medicine

## 2022-02-16 DIAGNOSIS — Z1231 Encounter for screening mammogram for malignant neoplasm of breast: Secondary | ICD-10-CM

## 2022-02-20 ENCOUNTER — Inpatient Hospital Stay: Payer: Medicare Other | Admitting: Hematology and Oncology

## 2022-03-02 ENCOUNTER — Encounter: Payer: Self-pay | Admitting: Hematology and Oncology

## 2022-04-13 ENCOUNTER — Other Ambulatory Visit: Payer: Self-pay | Admitting: Family Medicine

## 2022-04-13 ENCOUNTER — Encounter: Payer: Self-pay | Admitting: Family Medicine

## 2022-04-13 DIAGNOSIS — Z853 Personal history of malignant neoplasm of breast: Secondary | ICD-10-CM

## 2022-04-19 ENCOUNTER — Other Ambulatory Visit: Payer: Self-pay | Admitting: Family Medicine

## 2022-04-19 ENCOUNTER — Ambulatory Visit
Admission: RE | Admit: 2022-04-19 | Discharge: 2022-04-19 | Disposition: A | Discharge status: Home / Self Care | Payer: Medicare Other | Source: Ambulatory Visit | Attending: Family Medicine | Admitting: Family Medicine

## 2022-04-19 DIAGNOSIS — Z853 Personal history of malignant neoplasm of breast: Secondary | ICD-10-CM
